# Patient Record
Sex: Male | Born: 1945 | Race: White | Hispanic: No | Marital: Married | Smoking: Never smoker
Health system: Southern US, Community
[De-identification: ages and names within clinical notes are randomized; demographics above are authoritative.]

## PROBLEM LIST (undated history)

## (undated) DIAGNOSIS — Z8679 Personal history of other diseases of the circulatory system: Secondary | ICD-10-CM

## (undated) DIAGNOSIS — G4730 Sleep apnea, unspecified: Secondary | ICD-10-CM

## (undated) DIAGNOSIS — N183 Chronic kidney disease, stage 3 unspecified: Secondary | ICD-10-CM

## (undated) DIAGNOSIS — I4891 Unspecified atrial fibrillation: Secondary | ICD-10-CM

## (undated) DIAGNOSIS — F419 Anxiety disorder, unspecified: Secondary | ICD-10-CM

## (undated) DIAGNOSIS — I10 Essential (primary) hypertension: Secondary | ICD-10-CM

## (undated) DIAGNOSIS — Z9889 Other specified postprocedural states: Secondary | ICD-10-CM

## (undated) DIAGNOSIS — G454 Transient global amnesia: Secondary | ICD-10-CM

## (undated) DIAGNOSIS — Z951 Presence of aortocoronary bypass graft: Secondary | ICD-10-CM

## (undated) DIAGNOSIS — N200 Calculus of kidney: Secondary | ICD-10-CM

## (undated) HISTORY — PX: CYSTOSCOPY: SUR368

## (undated) HISTORY — PX: TONSILLECTOMY: SUR1361

---

## 2006-08-16 ENCOUNTER — Ambulatory Visit: Payer: Self-pay | Admitting: Cardiovascular Disease

## 2007-02-24 ENCOUNTER — Ambulatory Visit: Payer: Self-pay | Admitting: Cardiovascular Disease

## 2011-05-11 NOTE — Letter (Signed)
August 16, 2006     Doreen Beam, M.D.  [address]  [place], [place]  [address]   RE:  XAVIAR, LUNN  MRN:  [id number]  /  DOB:  10/25/1947   Dear Dr. Sherril Croon:   It was my pleasure to evaluate Matheu Ploeger in the office today regarding his  mitral valve prolapse with mitral regurgitation.   As you know he is a very pleasant 65 year old man who has a long standing  history of mitral valve disease.  He reports initially being diagnosed as a  child with a heart murmur and he reports that he has been taking antibiotic  prophylaxis for dental procedures for many years.  He has had  echocardiograms in the past that have revealed mitral valve prolapse with  mild regurgitation by his report.  More recently he underwent a  transthoracic echocardiogram dated July 22, 2006 and he was found to have  moderately severe mitral regurgitation secondary to bileaflet with prolapse.  The study was technically limited and the mitral valve was not well  visualized. An exocentric mitral regurg jet was noted.  There was also  severe pulmonary hypertension suggestive of hemodynamically significant MR.  His LV ejection fraction was estimated at >60%.   From a symptomatic standpoint Mr. Breaker is doing very well.  He is an active  gentleman as he exercises on a treadmill at a speed of 4 miles per hour for  3-4 miles regularly.  He also lifts light weights on a regular basis.  He is  completely asymptomatic with these activities.  He specifically denies chest  pain, dyspnea, orthopnea, PND, light headedness, palpitations, syncope or  claudication symptoms.   PAST MEDICAL HISTORY:  Is pertinent for the following:  1. Obstructive sleep apnea.  He has not required treatment with CPAP.  2. Mitral valve prolapse with mitral regurgitation as described above.  3. Essential hypertension with a significant component of white coat      hypertension as well.  4. He has had no surgeries and no hospitalizations in the past.   FAMILY HISTORY:  His mother died at age 70 of cancer.  His father died at  age 6 of heart disease.  He has one living sister who is healthy.   SOCIAL HISTORY:  He is married and lives in [place].  He works in [employer].   He does not smoke cigarettes.  He drinks 2 alcoholic beverages daily.  He  does not drink any caffeine.  As above he has been very active in the past,  although he has not exercised as much lately due to his busy schedule.   MEDICATIONS:  1. Toprol XL 100 mg daily.   ALLERGIES:  SHELL FISH and DEMEROL.   REVIEW OF SYSTEMS:  A complete 12-point review of systems was performed and  all symptoms were negative.   PHYSICAL EXAMINATION:  GENERAL:  The patient is alert and oriented.  He is a  healthy appearing white male in no acute distress.  He is well-developed and  well-nourished.  VITAL SIGNS:  His weight is 180 pounds.  Height is 5 feet 10 inches.  Initial blood pressure was 198/122.  Blood pressure on my recheck was  180/110 in both arms.  Heart rate is 62.  Respiratory rate is 12.  EYES:  Sclerae anicteric, conjunctiva pink.  Pupils are equal, round and  reactive to light.  ENT:  The oropharynx is clear.  Moist oral mucosa.  No  cervical  lymphadenopathy.  LUNGS:  Clear to auscultation bilaterally.  CARDIOVASCULAR:  The apex is discrete and non-displaced.  The heart is  regular rate and rhythm.  There is a grade 3/6 mid to late systolic murmur  heard throughout the precordium but loudest at the apex.  Murmur actually  radiates to the neck.  There are no gallops present.  With going from the  squatting to standing position the patient's heart rate does not  significantly change and the murmur does not accentuate or become  pansystolic.  There are no midsystolic clicks present.  ABDOMEN:  Soft, non-tender.  No organomegaly.  No abdominal bruits. Bowel  sounds are present.  EXTREMITIES:  Peripheral pulses are 2+ and equal throughout.  There  are no  femoral bruits.  There are bruits over the carotid arteries but the sounds  are much louder towards the heart and it sounds more like radiation of this  heart murmur.  SKIN:  Warm and dry without rash.  NEUROLOGIC:  Strength is 5/5 and equal in the arms and legs bilaterally.  LYMPHATICS:  There is no adenopathy.   EKG:  Demonstrates normal sinus rhythm and is within normal limits.   ASSESSMENT:  This is a 65 year old man with asymptomatic valvular disease.  He presents with the following problems:  1. Mitral valve prolapse with moderate to severe mitral regurgitation.      His transthoracic echo is suggestive of moderate to severe mitral      reflux and his exam is suggestive of at least moderate mitral      regurgitation.  His left ventricular function appears well preserved      and by exam his left ventricular does not feel enlarged.  With the      technical limitations of the surface echocardiogram I would like for      Mr. Popov to undergo a transesophageal echocardiogram to better      delineate his valve morphology as well as the severity of mitral reflux      and cardiac chamber size.  We had a long discussion today about      potential treatment options.  I advised him that if he has normal left      ventricular size and preserved left ventricular fraction then we would      monitor him over time and simply continue with antibiotic prophylaxis.      I did advise him to avoid any heavy lifting because of the risk of a      torn leaflet.  However, I encouraged him to continue with his regular      cardiovascular exercises and light weight lifting.   I will followup with him by telephone after his transesophageal  echocardiography is completed and I would like to see him at least every six  months for a cardiovascular exam.  I will continue with yearly  echocardiography to follow his left ventricular size and function. 1. Hypertension.  While he clearly has a component of  white coat      hypertension he reports reasonable blood pressure control at home with      blood pressures ranging in the 130s/80s.  However, with his blood      pressure in the office in the range of 180 to 200 over 110 to 120,  I      really would like him to increase his antihypertensive medication.  I      suspect that during the course of  a regular day his blood pressure is      running on the high side.  I have taken the liberty of adding Atacand 4      mg daily to his regimen and this can be titrated upward as an      outpatient.  He should remain on 100 mg of Toprol XL.   Dr. Sherril Croon, thank you again for allowing me the opportunity to evaluate Mr.  Gowdy.  Please feel free to give me a call at anytime with questions  regarding his care.    Sincerely,      Micheline Chapman, MD   MDC/MedQ  DD:  08/16/2006  DT:  08/16/2006  Job #:  [id number]

## 2011-05-11 NOTE — Assessment & Plan Note (Signed)
[facility]                            CARDIOLOGY OFFICE NOTE   NAME:Henry Page                          MRN:          [id number]  DATE:02/24/2007                            DOB:          10/25/1947    HISTORY OF PRESENT ILLNESS:  Henry Page returns as an outpatient to the  [facility] on February 24, 2007 to follow up his mitral valve  disease.  Mr. Henry Page has mitral valve prolapse with moderately severe  mitral regurgitation.  From a symptomatic standpoint, he is doing very  well.  He specifically denies chest pain, dyspnea, lightheadedness,  orthopnea, PND, edema or syncope.  He was initially seen here on August 16, 2006 and at that time I recommended that he undergo a  transesophageal echocardiogram as his surface echo was technically  limited.  Due to a busy work schedule, he canceled his TEE and has not  had that test performed.  He has been following antibiotic prophylaxis  appropriately.  He has avoided heavy lifting.  He has remained active  only on an intermittent basis but has only exercised about once a week.   Mr. Henry Page has a history of white coat hypertension and he reports very  good home blood pressure control with rare systolic blood pressures  greater than 140 but the majority are in the 120s and 130s.   CURRENT MEDICATIONS:  1. Include only Atacand 4 mg daily.  2. Toprol 100 mg daily.   PHYSICAL EXAMINATION:  The patient is alert and oriented.  He is in no  acute distress.  Weight is 185 pounds.  Blood pressure is 178/110.  Heart rate is 63.  Respiratory rate is 12.  HEENT:  Normal.  NECK:  Normal carotid upstrokes without bruit.  Jugular venous pressure  is normal.  LUNGS:  Clear to auscultation bilaterally.  HEART:  The apex is dynamic but discreet.  It is nondisplaced.  The  heart is regular rate and rhythm with a 3/6 holosystolic murmur.  There  are no gallops or diastolic murmurs present.  ABDOMEN:  Soft and  nontender.  No organomegaly.  EXTREMITIES:  No clubbing, cyanosis, or edema.  Peripheral pulses are 2+  and equal throughout.   EKG demonstrates normal sinus rhythm and is within normal limits.   ASSESSMENT:  Mr. Henry Page is currently stable from a cardiovascular  standpoint with regard to his mitral valve prolapse with moderately  severe mitral regurgitation.  I have recommended that he go ahead and  get his transesophageal echocardiogram study done here in the next 1-2  months.  If he is proven to have normal cardiac chamber size, which I  suspect he will, then I would favor a course of watchful waiting  regarding his mitral valve disease.  He should continue to follow  antibiotic prophylaxis per ACC guidelines.  I will plan on seeing him  back in 6 months for regular follow-up with a physical exam at that  point.  He will continue to undergo yearly echocardiography.   Regarding his hypertension, I have reviewed this in  detail with him  today.  I think it is unusual to have blood pressures as high as he does  in the office and yet have good blood pressure control at home.  He is  adamant that his blood pressure control at home is good and he has a  relatively new blood pressure cuff that he has tested against other  cuffs and is accurate.  I think he should continue on his current  medicines and I have asked him to bring in a copy of the readings from  his regular blood pressure checks.     Henry Page. Henry Seltzer, MD  Electronically Signed    MDC/MedQ  DD: 02/24/2007  DT: 02/25/2007  Job #: [id number]   cc:   Henry Page

## 2013-07-04 DIAGNOSIS — R0602 Shortness of breath: Secondary | ICD-10-CM

## 2013-07-05 DIAGNOSIS — I4891 Unspecified atrial fibrillation: Secondary | ICD-10-CM

## 2013-07-05 DIAGNOSIS — R0602 Shortness of breath: Secondary | ICD-10-CM

## 2013-07-06 ENCOUNTER — Inpatient Hospital Stay (HOSPITAL_COMMUNITY)
Admission: AD | Admit: 2013-07-06 | Discharge: 2013-07-09 | DRG: 543 | Disposition: A | Discharge status: Home / Self Care | Payer: BC Managed Care – PPO | Source: Other Acute Inpatient Hospital | Attending: Internal Medicine | Admitting: Internal Medicine

## 2013-07-06 ENCOUNTER — Encounter (HOSPITAL_COMMUNITY): Payer: Self-pay | Admitting: Thoracic Surgery (Cardiothoracic Vascular Surgery)

## 2013-07-06 DIAGNOSIS — I2584 Coronary atherosclerosis due to calcified coronary lesion: Secondary | ICD-10-CM | POA: Diagnosis present

## 2013-07-06 DIAGNOSIS — I059 Rheumatic mitral valve disease, unspecified: Principal | ICD-10-CM | POA: Diagnosis present

## 2013-07-06 DIAGNOSIS — I340 Nonrheumatic mitral (valve) insufficiency: Secondary | ICD-10-CM | POA: Diagnosis present

## 2013-07-06 DIAGNOSIS — I509 Heart failure, unspecified: Secondary | ICD-10-CM | POA: Diagnosis present

## 2013-07-06 DIAGNOSIS — R0602 Shortness of breath: Secondary | ICD-10-CM

## 2013-07-06 DIAGNOSIS — R002 Palpitations: Secondary | ICD-10-CM

## 2013-07-06 DIAGNOSIS — I480 Paroxysmal atrial fibrillation: Secondary | ICD-10-CM | POA: Diagnosis present

## 2013-07-06 DIAGNOSIS — I5041 Acute combined systolic (congestive) and diastolic (congestive) heart failure: Secondary | ICD-10-CM | POA: Diagnosis present

## 2013-07-06 DIAGNOSIS — I341 Nonrheumatic mitral (valve) prolapse: Secondary | ICD-10-CM | POA: Diagnosis present

## 2013-07-06 DIAGNOSIS — F411 Generalized anxiety disorder: Secondary | ICD-10-CM | POA: Diagnosis not present

## 2013-07-06 DIAGNOSIS — I10 Essential (primary) hypertension: Secondary | ICD-10-CM

## 2013-07-06 DIAGNOSIS — I4891 Unspecified atrial fibrillation: Secondary | ICD-10-CM

## 2013-07-06 DIAGNOSIS — I2510 Atherosclerotic heart disease of native coronary artery without angina pectoris: Secondary | ICD-10-CM | POA: Diagnosis present

## 2013-07-06 HISTORY — DX: Essential (primary) hypertension: I10

## 2013-07-06 HISTORY — DX: Sleep apnea, unspecified: G47.30

## 2013-07-06 HISTORY — DX: Unspecified atrial fibrillation: I48.91

## 2013-07-06 HISTORY — DX: Calculus of kidney: N20.0

## 2013-07-06 MED ORDER — ASPIRIN 81 MG PO CHEW: 324.0000 mg | CHEWABLE_TABLET | ORAL | Status: DC

## 2013-07-06 MED ORDER — SODIUM CHLORIDE 0.9 % IJ SOLN: 3.0000 mL | INTRAMUSCULAR | Status: DC | PRN

## 2013-07-06 MED ORDER — OFF THE BEAT BOOK
Freq: Once | Status: AC
  Administered 2013-07-06: 1
  Filled 2013-07-06: qty 1

## 2013-07-06 MED ORDER — ENOXAPARIN SODIUM 80 MG/0.8ML ~~LOC~~ SOLN: 1.0000 mg/kg | Freq: Once | SUBCUTANEOUS | Status: DC

## 2013-07-06 MED ORDER — NITROGLYCERIN 0.4 MG SL SUBL: 0.4000 mg | SUBLINGUAL_TABLET | SUBLINGUAL | Status: DC | PRN

## 2013-07-06 MED ORDER — SODIUM CHLORIDE 0.9 % IV SOLN: 250.0000 mL | INTRAVENOUS | Status: DC | PRN

## 2013-07-06 MED ORDER — ASPIRIN 81 MG PO CHEW
324.0000 mg | CHEWABLE_TABLET | ORAL | Status: AC
  Administered 2013-07-06: 324 mg via ORAL
  Filled 2013-07-06: qty 4

## 2013-07-06 MED ORDER — SODIUM CHLORIDE 0.9 % IJ SOLN
3.0000 mL | Freq: Two times a day (BID) | INTRAMUSCULAR | Status: DC
  Administered 2013-07-06 – 2013-07-07 (×2): 3 mL via INTRAVENOUS

## 2013-07-06 MED ORDER — ASPIRIN EC 81 MG PO TBEC
81.0000 mg | DELAYED_RELEASE_TABLET | Freq: Every day | ORAL | Status: DC
  Administered 2013-07-08 – 2013-07-09 (×2): 81 mg via ORAL
  Filled 2013-07-06 (×3): qty 1

## 2013-07-06 MED ORDER — SODIUM CHLORIDE 0.9 % IJ SOLN: 3.0000 mL | Freq: Two times a day (BID) | INTRAMUSCULAR | Status: DC

## 2013-07-06 MED ORDER — ONDANSETRON HCL 4 MG/2ML IJ SOLN: 4.0000 mg | Freq: Four times a day (QID) | INTRAMUSCULAR | Status: DC | PRN

## 2013-07-06 MED ORDER — ASPIRIN 300 MG RE SUPP
300.0000 mg | RECTAL | Status: AC
  Filled 2013-07-06: qty 1

## 2013-07-06 MED ORDER — SODIUM CHLORIDE 0.9 % IV SOLN
1.0000 mL/kg/h | INTRAVENOUS | Status: DC
  Administered 2013-07-07: 1 mL/kg/h via INTRAVENOUS

## 2013-07-06 MED ORDER — ASPIRIN 81 MG PO CHEW
324.0000 mg | CHEWABLE_TABLET | ORAL | Status: AC
  Administered 2013-07-07: 324 mg via ORAL
  Filled 2013-07-06: qty 4

## 2013-07-06 MED ORDER — ENOXAPARIN SODIUM 40 MG/0.4ML ~~LOC~~ SOLN
40.0000 mg | SUBCUTANEOUS | Status: DC
  Administered 2013-07-06: 40 mg via SUBCUTANEOUS
  Filled 2013-07-06: qty 0.4

## 2013-07-06 MED ORDER — ACETAMINOPHEN 325 MG PO TABS: 650.0000 mg | ORAL_TABLET | ORAL | Status: DC | PRN

## 2013-07-06 MED ORDER — METOPROLOL TARTRATE 25 MG PO TABS
25.0000 mg | ORAL_TABLET | Freq: Two times a day (BID) | ORAL | Status: DC
  Administered 2013-07-06 – 2013-07-09 (×6): 25 mg via ORAL
  Filled 2013-07-06 (×8): qty 1

## 2013-07-06 MED ORDER — SODIUM CHLORIDE 0.9 % IV SOLN: 1.0000 mL/kg/h | INTRAVENOUS | Status: DC

## 2013-07-06 NOTE — H&P (Addendum)
Patient ID: Henry Page MRN: [id number], DOB/AGE: 1946/07/23   Admit date: 07/06/2013 Date of Consult: @TODAY @  Primary Physician: Ignatius Specking., MD Primary Cardiologist: Dayle Points  Last seen 2008.    Problem List: No past medical history on file.  No past surgical history on file.   Allergies:  Allergies  Allergen Reactions  . Meperidine And Related Itching  . Sulfa Antibiotics     "very sick"    HPI: patinet is a 66yo who was transferred from [facility] for evaluation of mitral regurgitation. The patient has a history of MV prolapse and moderate MR.  Also a history of HTN  Last seen by Dayle Points in 2008  Patient presented to [facility] on 7/12 with worseing palpitations and SOB.  Breathng worsened signif over 24 hours before admit.  Found to be in SVT in ER  Rx with diltiazem.  BNP elev at 489  CT of chest showed pulm edema, coronary atheroscleroisis in LAD and LCx and LM.   Patient deneis f/c,  Has had sinusitis  Rx with oxacillin Deneis CP  Denies syncope In ICU patient transitioned from dilitazem to metoprolol  Lovenox started.He was diuresed with IV lasix.  TTE was done that showed LVE at 55 mm.  LVEF 55%  RV normal.  Severe prolapse/flail of posterior MV leafelt  Severe MR  That is eccentric.  Cannot completely exclued veg vs redundancy of valve.  Cephalosporin started empirically.   Inpatient Medications:   No family history on file.   History   Social History  . Marital Status: Married    Spouse Name: N/A    Number of Children: N/A  . Years of Education: N/A   Occupational History  . Not on file.   Social History Main Topics  . Smoking status: Not on file  . Smokeless tobacco: Not on file  . Alcohol Use: Not on file  . Drug Use: Not on file  . Sexually Active: Not on file   Other Topics Concern  . Not on file   Social History Narrative  . No narrative on file     Review of Systems: General: negative for chills, fever, night sweats or  weight changes.  Cardiovascular: negative for chest pain, dyspnea on exertion, edema, orthopnea, palpitations, paroxysmal nocturnal dyspnea or shortness of breath Dermatological: negative for rash Respiratory: negative for cough or wheezing Urologic: negative for hematuria Abdominal: negative for nausea, vomiting, diarrhea, bright red blood per rectum, melena, or hematemesis Neurologic: negative for visual changes, syncope, or dizziness All other systems reviewed and are otherwise negative except as noted above.  Physical Exam: Filed Vitals:   07/06/13 1324  BP: 130/96  Pulse: 97  Temp: 98.1 F (36.7 C)  Resp: 20   No intake or output data in the 24 hours ending 07/06/13 1422  General: Well developed, well nourished, in no acute distress. Head: Normocephalic, atraumatic, sclera non-icteric Neck: Negative for carotid bruits. JVP not elevated. Lungs: Clear bilaterally to auscultation without wheezes, rales, or rhonchi. Breathing is unlabored. Heart: RRR with S1 S2. No murmurs, rubs, or gallops appreciated. Abdomen: Soft, non-tender, non-distended with normoactive bowel sounds. No hepatomegaly. No rebound/guarding. No obvious abdominal masses. Msk:  Strength and tone appears normal for age. Extremities: No clubbing, cyanosis or edema.  Distal pedal pulses are 2+ and equal bilaterally. Neuro: Alert and oriented X 3. Moves all extremities spontaneously. Psych:  Responds to questions appropriately with a normal affect.  Labs: No results found for this or any  previous visit (from the past 24 hour(s)).  Radiology/Studies: No results found.  EKG:  7/13 at 8:52 AM  Atrial fibrillation.  T wave inversion V1/V2.   7/12  Afib 148 bpm.    ASSESSMENT AND PLAN:  Patient is a 67 yo who presented with severe SOB, palpitations  Echo shows severe MR QUestion flail leaflet.  No signs on exam to suggest endocarditis  He did have fever last week and is on ABX.  I would recomm TEE and Cath to  evaluate valve and coronary anatomy.  TEE may not be able to be done tomorrow as schedule full  Order  May happen on Wed. I have discussed with C Owen.  Patient understands he will need surgery If he develops fever would do blood cultures  2.  Atial fibrillaton  Continue with rate control, Lovenox  3.  CHF  Secondary to 1.  Follow and Rx with lasix.     Signed, Dietrich Pates 07/06/2013, 2:22 PM

## 2013-07-06 NOTE — Progress Notes (Signed)
TCTS BRIEF PROGRESS NOTE   Patient seen and examined.  Await TEE and cath.  Full consult to follow.  Henry Page H 07/06/2013 4:47 PM

## 2013-07-06 NOTE — Consult Note (Addendum)
[address]       Henry Page [address]             [phone]          CARDIOTHORACIC SURGERY CONSULTATION REPORT  PCP is Page,Henry B., MD Referring Provider is ROSS, Sherol Dade, MD   Reason for consultation:  Severe mitral regurgitation  HPI:  Patient is a 66 year old married white male from [facility] with known history of mitral valve prolapse and mitral regurgitation who was transferred from [facility] for evaluation of severe mitral regurgitation with rapid atrial fibrillation and acute combined systolic and diastolic congestive heart failure.  The patient reports that he has known for many years that he had a heart murmur. He has been told in the past that he had mitral valve prolapse and mitral regurgitation. He has been treated intermittently for hypertension and in the past had been seen by Dr. Excell Page, most recently in 2008. The patient has otherwise remained remarkably healthy all of his life and physically active. He walks a mile nearly every day and reports no significant physical limitations. He does note a long history of mild exertional shortness of breath. However, this had been quite stable until last week when he somewhat acutely developed severe exertional shortness of breath and intermittent episodes of resting shortness of breath. He has a long history of palpitations without dizzy spells or syncope. He has never had any chest pain or chest tightness.    The patient's symptoms of shortness of breath and palpitations progressed over the course of last week until he presented 2 days ago at [facility] where he was found to be in supraventricular tachycardia. BNP level was 489.  CT scan of the chest revealed pulmonary edema with coronary atherosclerosis in the left anterior descending coronary artery, left circumflex coronary artery. In the ED he was treated with intravenous diltiazem, and his shortness of breath improved with medical  therapy including beta blocker for rate control and intravenous Lasix. A transthoracic echocardiogram was performed that reportedly demonstrated mitral valve prolapse with an obvious flail segment of the posterior leaflet of the mitral valve and severe mitral regurgitation. Left ventricular ejection fraction was estimated 55%. The patient was reportedly started on a cephalosporin empirically because of the possibility of bacterial endocarditis, and he was transferred to [facility] for further management. Cardiothoracic surgical consultation has been requested. Transesophageal echocardiogram has been scheduled for tomorrow.  Past Medical History  Diagnosis Date  . Hypertension   . Kidney stones   . Mitral valve prolapse 07/06/2013  . Mitral regurgitation 07/06/2013  . Atrial fibrillation 07/06/2013  . Essential hypertension, benign 07/06/2013  . Acute combined systolic and diastolic congestive heart failure 07/06/2013    No past surgical history on file.  No family history on file.  History   Social History  . Marital Status: Married    Spouse Name: N/A    Number of Children: N/A  . Years of Education: N/A   Occupational History  . sales    Social History Main Topics  . Smoking status: Never Smoker   . Smokeless tobacco: Not on file  . Alcohol Use: No  . Drug Use: Not on file  . Sexually Active: Not on file   Other Topics Concern  . Not on file   Social History Narrative  . No narrative on file    Prior to Admission medications   Not on File  Current Facility-Administered Medications  Medication Dose Route Frequency Provider Last Rate Last Dose  . 0.9 %  sodium chloride infusion  250 mL Intravenous PRN Pricilla Riffle, MD      . acetaminophen (TYLENOL) tablet 650 mg  650 mg Oral Q4H PRN Pricilla Riffle, MD      . aspirin chewable tablet 324 mg  324 mg Oral NOW Pricilla Riffle, MD       Or  . aspirin suppository 300 mg  300 mg Rectal NOW Pricilla Riffle, MD        . Melene Muller ON 07/07/2013] aspirin EC tablet 81 mg  81 mg Oral Daily Pricilla Riffle, MD      . enoxaparin (LOVENOX) injection 40 mg  40 mg Subcutaneous Q24H Pricilla Riffle, MD      . nitroGLYCERIN (NITROSTAT) SL tablet 0.4 mg  0.4 mg Sublingual Q5 Min x 3 PRN Pricilla Riffle, MD      . off the beat book   Does not apply Once Pricilla Riffle, MD      . ondansetron [facility]) injection 4 mg  4 mg Intravenous Q6H PRN Pricilla Riffle, MD      . sodium chloride 0.9 % injection 3 mL  3 mL Intravenous Q12H Pricilla Riffle, MD      . sodium chloride 0.9 % injection 3 mL  3 mL Intravenous PRN Pricilla Riffle, MD        Allergies  Allergen Reactions  . Meperidine And Related Itching  . Sulfa Antibiotics     "very sick"      Review of Systems:   General:  normal appetite, normal energy, no weight gain, no weight loss, no fever  Cardiac:  no chest pain with exertion, no chest pain at rest, + SOB with exertion, + intermittent resting SOB, no PND, no orthopnea, + palpitations, + arrhythmia, + atrial fibrillation, no LE edema, no dizzy spells, no syncope  Respiratory:  + shortness of breath, no home oxygen, no productive cough, no dry cough, no bronchitis, no wheezing, no hemoptysis, no asthma, no pain with inspiration or cough, no sleep apnea, no CPAP at night  GI:   no difficulty swallowing, no reflux, no frequent heartburn, no hiatal hernia, no abdominal pain, no constipation, no diarrhea, no hematochezia, no hematemesis, no melena  GU:   no dysuria,  no frequency, no urinary tract infection, no hematuria, no enlarged prostate, + kidney stones, no kidney disease  Vascular:  no pain suggestive of claudication, no pain in feet, no leg cramps, no varicose veins, no DVT, no non-healing foot ulcer  Neuro:   no stroke, no TIA's, no seizures, no headaches, no temporary blindness one eye,  no slurred speech, no peripheral neuropathy, no chronic pain, no instability of gait, no memory/cognitive dysfunction  Musculoskeletal: no  arthritis, no joint swelling, no myalgias, no difficulty walking, normal mobility   Skin:   no rash, no itching, no skin infections, no pressure sores or ulcerations  Psych:   no anxiety, no depression, no nervousness, + unusual recent stress  Eyes:   no blurry vision, no floaters, no recent vision changes, + wears glasses or contacts  ENT:   no hearing loss, no loose or painful teeth, no dentures, last saw dentist within the past year - always gets antibiotic prophylaxis  Hematologic:  no easy bruising, no abnormal bleeding, no clotting disorder, no frequent epistaxis  Endocrine:  no diabetes, does not check CBG's  at home     Physical Exam:   BP 130/96  Pulse 97  Temp(Src) 98.1 F (36.7 C) (Oral)  Resp 20  Ht 5\' 10"  (1.778 m)  Wt 76.8 kg (169 lb 5 oz)  BMI 24.29 kg/m2  SpO2 94%  General:    well-appearing  HEENT:  Unremarkable   Neck:   no JVD, no bruits, no adenopathy   Chest:   clear to auscultation, symmetrical breath sounds, no wheezes, no rhonchi   CV:   RRR, loud grade IV/VI holosystolic murmur   Abdomen:  soft, non-tender, no masses   Extremities:  warm, well-perfused, pulses palpable  Rectal/GU  Deferred  Neuro:   Grossly non-focal and symmetrical throughout  Skin:   Clean and dry, no rashes, no breakdown  Diagnostic Tests:  By report the transthoracic echocardiogram performed at [facility] revealed mitral valve prolapse with flail segment of the posterior leaflet and severe mitral regurgitation. The jet of regurgitation was eccentric. Left ventricular ejection fraction was estimated 55%. By report the RV size and function appeared normal. Images from this exam are not currently available for review.   Impression:  Severe symptomatic mitral regurgitation with mitral valve prolapse and flail segment posterior leaflet of the mitral valve. The patient presents with likely recent onset persistent atrial fibrillation with acute on chronic combined systolic and  diastolic congestive heart failure. Symptoms have improved rapidly with medical therapy.  The patient has no symptoms nor signs of bacterial endocarditis. Transesophageal echocardiogram is pending.  Plan:  We await transesophageal echocardiogram. Ultimately the patient will need left and right heart catheterization. I would favor that catheterization be performed via radial or left femoral approach, as the patient may be candidate for minimally invasive approach for surgery in the absence of significant coronary artery disease. I would favor loading the patient was amiodarone. Will continue to follow closely.    Salvatore Decent. Cornelius Moras, MD 07/06/2013 5:40 PM  I spent in excess of 60 minutes of time directly involved in the conduct of this consultation.

## 2013-07-06 NOTE — Progress Notes (Signed)
Unable to add to record Flu shots had in 2014 Pneumonia shot 2013

## 2013-07-06 NOTE — Progress Notes (Addendum)
Rec pt from [facility]p. Pt o4x no complaints of pain or sob. HF booklet and afib booklet giving. Pt states concern over limited fluid intake giving his Hx of kidney stones.  Pt also states he thinks he may have mild sleep apnea, could use outPT sleep study

## 2013-07-07 ENCOUNTER — Encounter (HOSPITAL_COMMUNITY): Payer: Self-pay | Admitting: Physician Assistant

## 2013-07-07 ENCOUNTER — Encounter (HOSPITAL_COMMUNITY)
Admission: AD | Disposition: A | Discharge status: Home / Self Care | Payer: Self-pay | Source: Other Acute Inpatient Hospital | Attending: Internal Medicine

## 2013-07-07 DIAGNOSIS — I4891 Unspecified atrial fibrillation: Secondary | ICD-10-CM

## 2013-07-07 DIAGNOSIS — I2510 Atherosclerotic heart disease of native coronary artery without angina pectoris: Secondary | ICD-10-CM

## 2013-07-07 HISTORY — PX: LEFT AND RIGHT HEART CATHETERIZATION WITH CORONARY ANGIOGRAM: SHX5449

## 2013-07-07 LAB — POCT I-STAT 3, ART BLOOD GAS (G3+)
Bicarbonate: 25.9 mEq/L — ABNORMAL HIGH (ref 20.0–24.0)
O2 Saturation: 97 %
TCO2: 27 mmol/L (ref 0–100)
pO2, Arterial: 93 mmHg (ref 80.0–100.0)

## 2013-07-07 LAB — CBC
HCT: 40.1 % (ref 39.0–52.0)
Hemoglobin: 13.2 g/dL (ref 13.0–17.0)
MCH: 31.4 pg (ref 26.0–34.0)
MCHC: 32.9 g/dL (ref 30.0–36.0)
RBC: 4.21 MIL/uL — ABNORMAL LOW (ref 4.22–5.81)

## 2013-07-07 LAB — BASIC METABOLIC PANEL
BUN: 27 mg/dL — ABNORMAL HIGH (ref 6–23)
CO2: 27 mEq/L (ref 19–32)
GFR calc non Af Amer: 50 mL/min — ABNORMAL LOW (ref >90)
Glucose, Bld: 99 mg/dL (ref 70–99)
Potassium: 4.1 mEq/L (ref 3.5–5.1)

## 2013-07-07 LAB — POCT I-STAT 3, VENOUS BLOOD GAS (G3P V)
Bicarbonate: 25.1 mEq/L — ABNORMAL HIGH (ref 20.0–24.0)
O2 Saturation: 61 %
TCO2: 27 mmol/L (ref 0–100)
pH, Ven: 7.329 — ABNORMAL HIGH (ref 7.250–7.300)

## 2013-07-07 LAB — PROTIME-INR: INR: 1.04 (ref 0.00–1.49)

## 2013-07-07 SURGERY — LEFT AND RIGHT HEART CATHETERIZATION WITH CORONARY ANGIOGRAM

## 2013-07-07 MED ORDER — ONDANSETRON HCL 4 MG/2ML IJ SOLN: 4.0000 mg | Freq: Four times a day (QID) | INTRAMUSCULAR | Status: DC | PRN

## 2013-07-07 MED ORDER — NITROGLYCERIN 0.2 MG/ML ON CALL CATH LAB
INTRAVENOUS | Status: AC
  Filled 2013-07-07: qty 1

## 2013-07-07 MED ORDER — ACETAMINOPHEN 325 MG PO TABS: 650.0000 mg | ORAL_TABLET | ORAL | Status: DC | PRN

## 2013-07-07 MED ORDER — SODIUM CHLORIDE 0.9 % IV SOLN
INTRAVENOUS | Status: DC
  Administered 2013-07-08: 01:00:00 via INTRAVENOUS

## 2013-07-07 MED ORDER — FENTANYL CITRATE 0.05 MG/ML IJ SOLN
INTRAMUSCULAR | Status: AC
  Filled 2013-07-07: qty 2

## 2013-07-07 MED ORDER — LIDOCAINE HCL (PF) 1 % IJ SOLN
INTRAMUSCULAR | Status: AC
  Filled 2013-07-07: qty 30

## 2013-07-07 MED ORDER — ATORVASTATIN CALCIUM 80 MG PO TABS
80.0000 mg | ORAL_TABLET | Freq: Every day | ORAL | Status: DC
  Administered 2013-07-07 – 2013-07-09 (×3): 80 mg via ORAL
  Filled 2013-07-07 (×3): qty 1

## 2013-07-07 MED ORDER — SODIUM CHLORIDE 0.9 % IV SOLN: INTRAVENOUS | Status: DC

## 2013-07-07 MED ORDER — ENOXAPARIN SODIUM 80 MG/0.8ML ~~LOC~~ SOLN
80.0000 mg | Freq: Two times a day (BID) | SUBCUTANEOUS | Status: DC
  Administered 2013-07-07 – 2013-07-08 (×3): 80 mg via SUBCUTANEOUS
  Filled 2013-07-07 (×6): qty 0.8

## 2013-07-07 MED ORDER — MIDAZOLAM HCL 2 MG/2ML IJ SOLN
INTRAMUSCULAR | Status: AC
  Filled 2013-07-07: qty 2

## 2013-07-07 MED ORDER — HEPARIN (PORCINE) IN NACL 2-0.9 UNIT/ML-% IJ SOLN
INTRAMUSCULAR | Status: AC
  Filled 2013-07-07: qty 1500

## 2013-07-07 MED ORDER — LEVOFLOXACIN IN D5W 750 MG/150ML IV SOLN
750.0000 mg | INTRAVENOUS | Status: DC
  Administered 2013-07-07 – 2013-07-08 (×2): 750 mg via INTRAVENOUS
  Filled 2013-07-07 (×4): qty 150

## 2013-07-07 NOTE — Interval H&P Note (Signed)
History and Physical Interval Note:  07/07/2013 10:18 AM  Henry Page  has presented today for surgery, with the diagnosis of Chest pain  The various methods of treatment have been discussed with the patient and family. After consideration of risks, benefits and other options for treatment, the patient has consented to  Procedure(s): LEFT AND RIGHT HEART CATHETERIZATION WITH CORONARY ANGIOGRAM as a surgical intervention .  The patient's history has been reviewed, patient examined, no change in status, stable for surgery.  I have reviewed the patient's chart and labs.  Questions were answered to the patient's satisfaction.     Elyn Aquas.

## 2013-07-07 NOTE — Progress Notes (Signed)
ANTICOAGULATION CONSULT NOTE - Follow Up Consult  Pharmacy Consult for Lovenox Indication: atrial fibrillation  Allergies  Allergen Reactions  . Meperidine And Related Itching  . Sulfa Antibiotics     "very sick"    Patient Measurements: Height: 5\' 10"  (177.8 cm) Weight: 169 lb 1.5 oz (76.7 kg) (scale B) IBW/kg (Calculated) : 73  Vital Signs: Temp: 97.7 F (36.5 C) (07/15 1345) Temp src: Oral (07/15 1345) BP: 122/66 mmHg (07/15 1700) Pulse Rate: 70 (07/15 1700)  Labs:  Recent Labs  07/07/13 0507  HGB 13.2  HCT 40.1  PLT 214  LABPROT 13.4  INR 1.04  CREATININE 1.42*    Estimated Creatinine Clearance: 52.1 ml/min (by C-G formula based on Cr of 1.42).  Assessment:   s/p cardiac cath. Moderate to severe MR. May need valve replacement.  For TEE on 7/16.  Lovenox 80 mg SQ q12hrs begun 7/12 at [facility]. Held on transfer to [facility] for cath today. Sheath removed ~11:15am. Right groin site noted without bleeding or hematoma. Noted plan to change to IV heparin if surgery needed.  Goal of Therapy:  Anti-Xa level 0.6-1.2 units/ml 4hrs after LMWH dose given Monitor platelets by anticoagulation protocol: Yes   Plan:   Lovenox 80 mg SQ 12 hrs.  CBC at least every 3 days while on Lovenox.  Will follow up TEE and possible plans for surgery.  Dennie Fetters, [place] Pager: [phone] 07/07/2013,6:00 PM

## 2013-07-07 NOTE — CV Procedure (Signed)
    Cardiac Cath Note  Henry Page [id number] [birth year]  Procedure: right and left  Heart Cardiac Catheterization Note Indications: MR, MVP  Procedure Details Consent: Obtained Time Out: Verified patient identification, verified procedure, site/side was marked, verified correct patient position, special equipment/implants available, Radiology Safety Procedures followed,  medications/allergies/relevent history reviewed, required imaging and test results available.  Performed   Medications: Fentanyl: 50 mcg IV Versed: 2 mg IV  The right femoral artery and right femoral vein were easily canulated using a modified Seldinger technique.  Hemodynamics:   RA: 10/11/9 RV: 43/6 PCWP: 21/25/21 PA:  43/25 (32)  Cardiac Output   Thermodilution: 3.78  Index of 1.94  Fick : 4.01    Index of 2.06  Arterial Sat: 97% PA Sat: 61%.  LV pressure: 99/17 Aortic pressure: 106/75  Angiography   Left Main: distal 40% , calcified  Left anterior Descending: proximal 40 % - 60% calcified stenosis.   Mid  60-70% stenosis.  diags are small - moderate in size and are otherwise normal.   Left Circumflex: moderate sized system.  50 % lesion in OM branch  Ramus Intermediate:  Large vessel 40-50% stenosis in proximal vessel.    Right Coronary Artery: large , dominant, mid 70 - 80% (long stenosis) .  PDA  LV Gram: normal LV function , EF 55%,  Moderate - severe MR  Complications: No apparent complications Patient did tolerate procedure well.  Contrast used: 80 cc  Conclusions:   1. CAD involving the LAD and RCA with moderate disease of the Ramus and LCx.   2. Well preserved LV function 3. Moderate - severe MR  Alvia Grove., MD, [facility] 07/07/2013, 11:06 AM Office - [phone] Pager [phone]

## 2013-07-07 NOTE — Progress Notes (Signed)
   Patient Name: Henry Page Date of Encounter: 07/07/2013     Principal Problem:   Acute combined systolic and diastolic congestive heart failure Active Problems:   Mitral valve prolapse   Mitral regurgitation   Atrial fibrillation   Essential hypertension, benign    SUBJECTIVE: Feels well today and post-cath. No chest pain, palpitations or shortness of breath.    OBJECTIVE  Filed Vitals:   07/07/13 1227 07/07/13 1245 07/07/13 1315 07/07/13 1345  BP: 101/70 119/84 122/74 121/70  Pulse: 70 81 78 71  Temp:    97.7 F (36.5 C)  TempSrc:    Oral  Resp: 20 20 20 18   Height:      Weight:      SpO2: 96% 96% 96% 96%    Intake/Output Summary (Last 24 hours) at 07/07/13 1604 Last data filed at 07/07/13 1300  Gross per 24 hour  Intake    463 ml  Output    650 ml  Net   -187 ml   Weight change:   PHYSICAL EXAM  General: Well developed, well nourished, in no acute distress. Head: Normocephalic, atraumatic, sclera non-icteric, no xanthomas, nares are without discharge.  Neck: Supple without bruits or JVD. Lungs:  Resp regular and unlabored, CTA. Heart: RRR, IV/VI systolic LLSB murmur radiating to axilla, no s3, s4 Abdomen: Soft, non-tender, non-distended, BS + x 4.  Msk:  Strength and tone appears normal for age. Extremities:  No clubbing, cyanosis or edema. DP/PT/Radials 2+ and equal bilaterally. Neuro: Alert and oriented X 3. Moves all extremities spontaneously. Psych: Normal affect.  LABS:  Recent Labs     07/07/13  0507  WBC  7.2  HGB  13.2  HCT  40.1  MCV  95.2  PLT  214   Recent Labs Lab 07/07/13 0507  NA 138  K 4.1  CL 100  CO2 27  BUN 27*  CREATININE 1.42*  CALCIUM 9.2  GLUCOSE 99    TELE: atrial fibrillation, 80-90 bpm  ECG: atrial fibrillation, 81 bpm, LAD, no ST/T changes   Radiology/Studies:  No results found.  Current Medications:  . aspirin EC  81 mg Oral Daily  . levofloxacin (LEVAQUIN) IV  750 mg Intravenous Q24H  .  metoprolol tartrate  25 mg Oral BID    ASSESSMENT AND PLAN:  1. Moderate-severe MR- with resultant acute CHF. Cath today indicated moderate-severe MR. TCTS evaluated the patient yesterday. TEE pending. Schedule full on admission. Orders placed, will make sure he is scheduled for tomorrow. NPO at midnight. Started on empiric levofloxacin at [facility]. Would continue until TEE performed to r/o vegetation. No evidence of fevers or leukocytosis.   2. Moderate CAD- 40% distal LM, 40-60% prox LAD, 60-70% mid LAD, 50% OM, 40-50% prox ramus, 70-80% mid RCA; LVEF 55%, mod-sev MR. Borderline CAD lesions. Will continue ASA, BB. Add atorvastatin 80 mg daily. Continue NTG SL PRN.   3. Atrial fibrillation with RVR- rate-controlled today on Lopressor 25 PO BID. Preference from TCTS was for amiodarone load. CHADSVASc = 4. Lovenox for anticoagulation. Switch to heparin should surgical intervention be required.   4. Hypertension- BPs well-controlled today.     Signed, R. Hurman Horn, PA-C 07/07/2013, 4:04 PM TEE tomorrow. No change in meds today.

## 2013-07-07 NOTE — Progress Notes (Addendum)
ANTIBIOTIC CONSULT NOTE - INITIAL  Pharmacy Consult for Levaquin Indication: rule out pneumonia  Allergies  Allergen Reactions  . Meperidine And Related Itching  . Sulfa Antibiotics     "very sick"    Patient Measurements: Height: 5\' 10"  (177.8 cm) Weight: 169 lb 5 oz (76.8 kg) IBW/kg (Calculated) : 73  Vital Signs: Temp: 97.9 F (36.6 C) (07/14 2137) Temp src: Oral (07/14 2137) BP: 139/86 mmHg (07/14 2137) Pulse Rate: 79 (07/14 2137) Intake/Output from previous day: 07/14 0701 - 07/15 0700 In: 240 [P.O.:240] Out: 620 [Urine:620] Intake/Output from this shift: Total I/O In: -  Out: 150 [Urine:150]  Labs (at [facility]): WBC   8.3 Hgb   12.7 Hct   36.8 Plt   179  SCr   1.44 No results found for this basename: WBC, HGB, PLT, LABCREA, CREATININE,  in the last 72 hours CrCl is unknown because no creatinine reading has been taken. No results found for this basename: VANCOTROUGH, VANCOPEAK, VANCORANDOM, GENTTROUGH, GENTPEAK, GENTRANDOM, TOBRATROUGH, TOBRAPEAK, TOBRARND, AMIKACINPEAK, AMIKACINTROU, AMIKACIN,  in the last 72 hours   Microbiology: No results found for this or any previous visit (from the past 720 hour(s)).  Medical History: Past Medical History  Diagnosis Date  . Hypertension   . Kidney stones   . Mitral valve prolapse 07/06/2013  . Mitral regurgitation 07/06/2013  . Atrial fibrillation 07/06/2013  . Essential hypertension, benign 07/06/2013  . 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 Heart murmur   . Sleep apnea     Medications:  No prescriptions prior to admission   Assessment: 67 yo male with SOB, possible PNA for empiric antibiotics.  On Levaquin 750 mg IV q24h at [facility], last dose appears to have been AM 7/14  Plan:  Continue Levaquin 750 mg IV q24h  Eddie Candle 07/07/2013,12:36 AM

## 2013-07-08 ENCOUNTER — Encounter (HOSPITAL_COMMUNITY): Payer: Self-pay | Admitting: *Deleted

## 2013-07-08 ENCOUNTER — Encounter (HOSPITAL_COMMUNITY)
Admission: AD | Disposition: A | Discharge status: Home / Self Care | Payer: Self-pay | Source: Other Acute Inpatient Hospital | Attending: Internal Medicine

## 2013-07-08 DIAGNOSIS — I509 Heart failure, unspecified: Secondary | ICD-10-CM

## 2013-07-08 DIAGNOSIS — I5041 Acute combined systolic (congestive) and diastolic (congestive) heart failure: Secondary | ICD-10-CM

## 2013-07-08 DIAGNOSIS — I059 Rheumatic mitral valve disease, unspecified: Secondary | ICD-10-CM

## 2013-07-08 HISTORY — PX: TEE WITHOUT CARDIOVERSION: SHX5443

## 2013-07-08 LAB — COMPREHENSIVE METABOLIC PANEL
ALT: 30 U/L (ref 0–53)
AST: 31 U/L (ref 0–37)
Albumin: 3.1 g/dL — ABNORMAL LOW (ref 3.5–5.2)
Alkaline Phosphatase: 81 U/L (ref 39–117)
Calcium: 8.9 mg/dL (ref 8.4–10.5)
Glucose, Bld: 99 mg/dL (ref 70–99)
Potassium: 4.1 mEq/L (ref 3.5–5.1)
Sodium: 139 mEq/L (ref 135–145)
Total Protein: 6.7 g/dL (ref 6.0–8.3)

## 2013-07-08 LAB — URINALYSIS, ROUTINE W REFLEX MICROSCOPIC
Ketones, ur: NEGATIVE mg/dL
Leukocytes, UA: NEGATIVE
Nitrite: NEGATIVE
Specific Gravity, Urine: 1.02 (ref 1.005–1.030)
pH: 6 (ref 5.0–8.0)

## 2013-07-08 SURGERY — ECHOCARDIOGRAM, TRANSESOPHAGEAL
Anesthesia: Moderate Sedation

## 2013-07-08 MED ORDER — MIDAZOLAM HCL 10 MG/2ML IJ SOLN
INTRAMUSCULAR | Status: DC | PRN
  Administered 2013-07-08 (×2): 2 mg via INTRAVENOUS

## 2013-07-08 MED ORDER — FENTANYL CITRATE 0.05 MG/ML IJ SOLN
INTRAMUSCULAR | Status: DC | PRN
  Administered 2013-07-08 (×2): 25 ug via INTRAVENOUS

## 2013-07-08 MED ORDER — MIDAZOLAM HCL 5 MG/ML IJ SOLN
INTRAMUSCULAR | Status: AC
  Filled 2013-07-08: qty 2

## 2013-07-08 MED ORDER — HYDRALAZINE HCL 20 MG/ML IJ SOLN: 10.0000 mg | INTRAMUSCULAR | Status: DC | PRN

## 2013-07-08 MED ORDER — BUTAMBEN-TETRACAINE-BENZOCAINE 2-2-14 % EX AERO
INHALATION_SPRAY | CUTANEOUS | Status: DC | PRN
  Administered 2013-07-08: 2 via TOPICAL

## 2013-07-08 MED ORDER — DIPHENHYDRAMINE HCL 50 MG/ML IJ SOLN
INTRAMUSCULAR | Status: AC
  Filled 2013-07-08: qty 1

## 2013-07-08 MED ORDER — AMIODARONE HCL 200 MG PO TABS
200.0000 mg | ORAL_TABLET | Freq: Two times a day (BID) | ORAL | Status: DC
  Administered 2013-07-08: 200 mg via ORAL
  Filled 2013-07-08 (×4): qty 1

## 2013-07-08 MED ORDER — FENTANYL CITRATE 0.05 MG/ML IJ SOLN
INTRAMUSCULAR | Status: AC
  Filled 2013-07-08: qty 2

## 2013-07-08 NOTE — Progress Notes (Signed)
  Echocardiogram Echocardiogram Transesophageal has been performed.  Persephonie Hegwood 07/08/2013, 2:15 PM

## 2013-07-08 NOTE — Progress Notes (Signed)
Report has been given to night shift RN.  Nurse has no questions or concerns at this time.  Pt orders have been reviewed.  Pt resting comfortably in bed and appears in no acute distress. Nino Glow  RN

## 2013-07-08 NOTE — Progress Notes (Signed)
Patient's B/P reassessed and still found to be elevated Metoprolol (10am dose) was given. Will continue to monitor B/P.  Patient is anxious, but stated that he does not want me to call MD and get him anything for it right now.  Will continue to monitor patient for increased anxiety and/or agitation.

## 2013-07-08 NOTE — Progress Notes (Signed)
Pt BP 154/111 manual this AM. HR in 90s. MD notified and ordered to give 10AM dose of lopressor early. Waiting for pharmacy to send dose. Baron Hamper, RN 07/08/2013

## 2013-07-08 NOTE — Plan of Care (Signed)
Problem: Phase I Progression Outcomes Goal: EF % per last Echo/documented,Core Reminder form on chart Outcome: Completed/Met Date Met:  07/08/13 Heart Catheterization on 07/07/2013 EF 55%

## 2013-07-08 NOTE — Interval H&P Note (Signed)
History and Physical Interval Note:  07/08/2013 1:26 PM  Bronc Brosseau  has presented today for surgery, with the diagnosis of Mitral Regurgitation  The various methods of treatment have been discussed with the patient and family. After consideration of risks, benefits and other options for treatment, the patient has consented to  Procedure(s): TRANSESOPHAGEAL ECHOCARDIOGRAM (TEE) (N/A) as a surgical intervention .  The patient's history has been reviewed, patient examined, no change in status, stable for surgery.  I have reviewed the patient's chart and labs.  Questions were answered to the patient's satisfaction.     Arlone Lenhardt [employer]

## 2013-07-08 NOTE — Progress Notes (Addendum)
    Subjective:  Anxious this am, but better now. No CP or dyspnea.  Objective:  Vital Signs in the last 24 hours: Temp:  [97.7 F (36.5 C)-98.1 F (36.7 C)] 97.9 F (36.6 C) (07/16 0500) Pulse Rate:  [70-96] 96 (07/16 0817) Resp:  [18-20] 18 (07/16 0817) BP: (100-162)/(60-118) 162/108 mmHg (07/16 0817) SpO2:  [95 %-96 %] 96 % (07/16 0817) Weight:  [78.155 kg (172 lb 4.8 oz)] 78.155 kg (172 lb 4.8 oz) (07/16 0500)  Intake/Output from previous day: 07/15 0701 - 07/16 0700 In: 613 [P.O.:460; I.V.:3; IV Piggyback:150] Out: 1100 [Urine:1100]  Physical Exam: Pt is alert and oriented, NAD HEENT: normal Neck: JVP - normal Lungs: CTA bilaterally CV: irregular with grade 3/6 holosystolic murmur at the LV apex Abd: soft, NT, Positive BS, no hepatomegaly Ext: no C/C/E, distal pulses intact and equal Skin: warm/dry no rash   Lab Results:  Recent Labs  07/07/13 0507  WBC 7.2  HGB 13.2  PLT 214    Recent Labs  07/07/13 0507 07/08/13 0655  NA 138 139  K 4.1 4.1  CL 100 105  CO2 27 27  GLUCOSE 99 99  BUN 27* 24*  CREATININE 1.42* 1.34   No results found for this basename: TROPONINI, CK, MB,  in the last 72 hours  Cardiac Studies: Cardiac Cath: Hemodynamics:  RA: 10/11/9  RV: 43/6  PCWP: 21/25/21  PA: 43/25 (32)  Cardiac Output  Thermodilution: 3.78 Index of 1.94  Fick : 4.01 Index of 2.06  Arterial Sat: 97%  PA Sat: 61%.  LV pressure: 99/17  Aortic pressure: 106/75  Angiography  Left Main: distal 40% , calcified  Left anterior Descending: proximal 40 % - 60% calcified stenosis. Mid 60-70% stenosis. diags are small - moderate in size and are otherwise normal.  Left Circumflex: moderate sized system. 50 % lesion in OM branch  Ramus Intermediate: Large vessel 40-50% stenosis in proximal vessel.  Right Coronary Artery: large , dominant, mid 70 - 80% (long stenosis) . PDA  LV Gram: normal LV function , EF 55%, Moderate - severe MR  Complications: No apparent  complications  Patient did tolerate procedure well.  Contrast used: 80 cc  Conclusions:  1. CAD involving the LAD and RCA with moderate disease of the Ramus and LCx.  2. Well preserved LV function  3. Moderate - severe MR   Tele: Atrial fibrillation  Assessment/Plan:  1. Acute systolic heart failure, improved 2. Severe MR - for TEE today 3. 2 vessel CAD 4. HTN  Will write for prn hydralazine for BP, but suspect anxiety is driving this today. I reviewed his cath films and suspect he will 2 vessel CABG of the LAD/RCA with his mitral valve surgery. Further plans pending TEE this afternoon. Appreciate Dr Orvan July consultation. Note patient is on lovenox and this will need to be stopped 24 hours before cardiac surgery.  Tonny Bollman, M.D. 07/08/2013, 10:31 AM

## 2013-07-08 NOTE — CV Procedure (Signed)
Procedure:TEE  Indication: Mitral regurgitation  Sedation: Versed 4 mg IV, Fentanyl 50 mcg IV  Findings: Please see report in echo section of chart for full details. Normal LV size and systolic function, EF 55-60%.  Moderate left atrial enlargement.  Normal RV size and systolic function.  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  There was systolic flow reversal in the pulmonic vein doppler profile.    No complications.   Marca Ancona 07/08/2013 1:54 PM

## 2013-07-08 NOTE — Progress Notes (Signed)
[address]       [name] [address]             [phone]     CARDIOTHORACIC SURGERY PROGRESS NOTE  Day of Surgery  S/P Procedure(s) (LRB): TRANSESOPHAGEAL ECHOCARDIOGRAM (TEE) (N/A)  Subjective: Mr Henry Page continues to deny any SOB or palpitations.  His symptoms have remained well controlled since his transfer to [facility].  He did have some anxiety this morning.  Objective: Vital signs in last 24 hours: Temp:  [97.9 F (36.6 C)-98.1 F (36.7 C)] 97.9 F (36.6 C) (07/16 1359) Pulse Rate:  [77-114] 114 (07/16 1258) Cardiac Rhythm:  [-] Atrial fibrillation (07/16 0900) Resp:  [6-44] 23 (07/16 1420) BP: (99-162)/(46-130) 112/77 mmHg (07/16 1420) SpO2:  [91 %-98 %] 97 % (07/16 1420) Weight:  [78.155 kg (172 lb 4.8 oz)] 78.155 kg (172 lb 4.8 oz) (07/16 0500)  Physical Exam:  Rhythm:   Afib  Breath sounds: clear  Heart sounds:  irreg w/ holosystolic murmur  Incisions:  n/a  Abdomen:  soft  Extremities:  Warm, no edema   Intake/Output from previous day: 07/15 0701 - 07/16 0700 In: 613 [P.O.:460; I.V.:3; IV Piggyback:150] Out: 1100 [Urine:1100] Intake/Output this shift:    Lab Results:  Recent Labs  07/07/13 0507  WBC 7.2  HGB 13.2  HCT 40.1  PLT 214   BMET:  Recent Labs  07/07/13 0507 07/08/13 0655  NA 138 139  K 4.1 4.1  CL 100 105  CO2 27 27  GLUCOSE 99 99  BUN 27* 24*  CREATININE 1.42* 1.34  CALCIUM 9.2 8.9    CBG (last 3)  No results found for this basename: GLUCAP,  in the last 72 hours PT/INR:   Recent Labs  07/07/13 0507  LABPROT 13.4  INR 1.04    CXR:  N/A  Assessment/Plan:   I have personally reviewed Mr Pullara's TEE and cath films.  He has mitral valve prolapse with a large flail segment (P2) of the posterior leaflet with severe (4+) mitral regurgitation.  LV systolic function is reasonably well preserved and the LV is not particularly dilated.  There is normal RV size and systolic function with trivial TR.   There was no sign of any vegetation nor other signs of bacterial endocarditis.  Cath reveals 3-vessel CAD.  Although there were no critical high-grade stenosis in any of the coronary arteries, he will require CABG at the time of mitral valve repair.  Pulmonary artery pressures were mildly elevated.  Mr Eisenhardt remains in persistent atrial fibrillation with a controlled rate.  He may benefit from maze procedure at the time of surgery.  The rationale for elective mitral valve repair surgery has been explained, including a comparison between surgery and continued medical therapy with close follow-up.  The likelihood of successful and durable valve repair has been discussed with particular reference to the findings of their recent echocardiogram.  Based upon these findings and previous experience, I have quoted them a greater than 95 percent likelihood of successful valve repair.  In the unlikely event that their valve cannot be successfully repaired, we discussed the possibility of replacing the mitral valve using a mechanical prosthesis with the attendant need for long-term anticoagulation versus the alternative of replacing it using a bioprosthetic tissue valve with its potential for late structural valve deterioration and failure, depending upon the patient's longevity.  The patient specifically requests that if the mitral valve must be replaced that it be done  using a bioprosthetic tissue valve.  The indications, risks and benefits associated with coronary artery bypass grafting and maze procedure were also discussed at length.  Clinically Mr Browe remains quite stable.  At present my operating room schedule is already full this week.  I favor starting him on amiodarone and letting him go home with plans to return for surgery on Wednesday July 30th.  I will see him in follow up in the office prior to surgery on Monday July 28th.  Will discuss w/ cardiology team to finalize plans.  All questions have been  addressed.   Purcell Nails 07/08/2013 5:39 PM

## 2013-07-08 NOTE — H&P (View-Only) (Signed)
    Subjective:  Anxious this am, but better now. No CP or dyspnea.  Objective:  Vital Signs in the last 24 hours: Temp:  [97.7 F (36.5 C)-98.1 F (36.7 C)] 97.9 F (36.6 C) (07/16 0500) Pulse Rate:  [70-96] 96 (07/16 0817) Resp:  [18-20] 18 (07/16 0817) BP: (100-162)/(60-118) 162/108 mmHg (07/16 0817) SpO2:  [95 %-96 %] 96 % (07/16 0817) Weight:  [78.155 kg (172 lb 4.8 oz)] 78.155 kg (172 lb 4.8 oz) (07/16 0500)  Intake/Output from previous day: 07/15 0701 - 07/16 0700 In: 613 [P.O.:460; I.V.:3; IV Piggyback:150] Out: 1100 [Urine:1100]  Physical Exam: Pt is alert and oriented, NAD HEENT: normal Neck: JVP - normal Lungs: CTA bilaterally CV: irregular with grade 3/6 holosystolic murmur at the LV apex Abd: soft, NT, Positive BS, no hepatomegaly Ext: no C/C/E, distal pulses intact and equal Skin: warm/dry no rash   Lab Results:  Recent Labs  07/07/13 0507  WBC 7.2  HGB 13.2  PLT 214    Recent Labs  07/07/13 0507 07/08/13 0655  NA 138 139  K 4.1 4.1  CL 100 105  CO2 27 27  GLUCOSE 99 99  BUN 27* 24*  CREATININE 1.42* 1.34   No results found for this basename: TROPONINI, CK, MB,  in the last 72 hours  Cardiac Studies: Cardiac Cath: Hemodynamics:  RA: 10/11/9  RV: 43/6  PCWP: 21/25/21  PA: 43/25 (32)  Cardiac Output  Thermodilution: 3.78 Index of 1.94  Fick : 4.01 Index of 2.06  Arterial Sat: 97%  PA Sat: 61%.  LV pressure: 99/17  Aortic pressure: 106/75  Angiography  Left Main: distal 40% , calcified  Left anterior Descending: proximal 40 % - 60% calcified stenosis. Mid 60-70% stenosis. diags are small - moderate in size and are otherwise normal.  Left Circumflex: moderate sized system. 50 % lesion in OM branch  Ramus Intermediate: Large vessel 40-50% stenosis in proximal vessel.  Right Coronary Artery: large , dominant, mid 70 - 80% (long stenosis) . PDA  LV Gram: normal LV function , EF 55%, Moderate - severe MR  Complications: No apparent  complications  Patient did tolerate procedure well.  Contrast used: 80 cc  Conclusions:  1. CAD involving the LAD and RCA with moderate disease of the Ramus and LCx.  2. Well preserved LV function  3. Moderate - severe MR   Tele: Atrial fibrillation  Assessment/Plan:  1. Acute systolic heart failure, improved 2. Severe MR - for TEE today 3. 2 vessel CAD 4. HTN  Will write for prn hydralazine for BP, but suspect anxiety is driving this today. I reviewed his cath films and suspect he will 2 vessel CABG of the LAD/RCA with his mitral valve surgery. Further plans pending TEE this afternoon. Appreciate Dr Owen's consultation. Note patient is on lovenox and this will need to be stopped 24 hours before cardiac surgery.  Ceria Suminski, M.D. 07/08/2013, 10:31 AM     

## 2013-07-09 ENCOUNTER — Encounter (HOSPITAL_COMMUNITY): Payer: Self-pay | Admitting: Cardiology

## 2013-07-09 ENCOUNTER — Inpatient Hospital Stay (HOSPITAL_COMMUNITY): Payer: BC Managed Care – PPO

## 2013-07-09 ENCOUNTER — Other Ambulatory Visit: Payer: Self-pay | Admitting: *Deleted

## 2013-07-09 DIAGNOSIS — I059 Rheumatic mitral valve disease, unspecified: Secondary | ICD-10-CM

## 2013-07-09 DIAGNOSIS — Z01810 Encounter for preprocedural cardiovascular examination: Secondary | ICD-10-CM

## 2013-07-09 LAB — PULMONARY FUNCTION TEST

## 2013-07-09 LAB — TSH: TSH: 6.633 u[IU]/mL — ABNORMAL HIGH (ref 0.350–4.500)

## 2013-07-09 LAB — CBC
Hemoglobin: 11.9 g/dL — ABNORMAL LOW (ref 13.0–17.0)
RBC: 3.76 MIL/uL — ABNORMAL LOW (ref 4.22–5.81)

## 2013-07-09 LAB — MAGNESIUM: Magnesium: 2.1 mg/dL (ref 1.5–2.5)

## 2013-07-09 LAB — BASIC METABOLIC PANEL
Chloride: 104 mEq/L (ref 96–112)
Creatinine, Ser: 1.36 mg/dL — ABNORMAL HIGH (ref 0.50–1.35)
GFR calc Af Amer: 61 mL/min — ABNORMAL LOW (ref >90)
GFR calc non Af Amer: 52 mL/min — ABNORMAL LOW (ref >90)

## 2013-07-09 MED ORDER — LEVOFLOXACIN 750 MG PO TABS
750.0000 mg | ORAL_TABLET | Freq: Every day | ORAL | Status: DC
  Administered 2013-07-09: 750 mg via ORAL
  Filled 2013-07-09: qty 1

## 2013-07-09 MED ORDER — ACETAMINOPHEN 325 MG PO TABS: 650.0000 mg | ORAL_TABLET | ORAL | Status: DC | PRN

## 2013-07-09 MED ORDER — NITROGLYCERIN 0.4 MG SL SUBL: 0.4000 mg | SUBLINGUAL_TABLET | SUBLINGUAL | Status: DC | PRN

## 2013-07-09 MED ORDER — AMIODARONE HCL 200 MG PO TABS
400.0000 mg | ORAL_TABLET | Freq: Two times a day (BID) | ORAL | Status: DC
  Administered 2013-07-09 (×2): 400 mg via ORAL
  Filled 2013-07-09 (×3): qty 2

## 2013-07-09 MED ORDER — METOPROLOL TARTRATE 25 MG PO TABS: 25.0000 mg | ORAL_TABLET | Freq: Two times a day (BID) | ORAL | Status: DC

## 2013-07-09 MED ORDER — ASPIRIN 325 MG PO TBEC: 325.0000 mg | DELAYED_RELEASE_TABLET | Freq: Every day | ORAL | Status: DC

## 2013-07-09 MED ORDER — LIDOCAINE-EPINEPHRINE 1 %-1:100000 IJ SOLN
10.0000 mL | Freq: Once | INTRAMUSCULAR | Status: DC
  Filled 2013-07-09: qty 10

## 2013-07-09 MED ORDER — ALBUTEROL SULFATE (5 MG/ML) 0.5% IN NEBU
2.5000 mg | INHALATION_SOLUTION | Freq: Once | RESPIRATORY_TRACT | Status: AC
  Administered 2013-07-09: 2.5 mg via RESPIRATORY_TRACT

## 2013-07-09 MED ORDER — ATORVASTATIN CALCIUM 40 MG PO TABS: 40.0000 mg | ORAL_TABLET | Freq: Every day | ORAL | Status: DC

## 2013-07-09 MED ORDER — AMIODARONE HCL 200 MG PO TABS: 400.0000 mg | ORAL_TABLET | Freq: Two times a day (BID) | ORAL | Status: DC

## 2013-07-09 NOTE — Progress Notes (Signed)
Pt had some bleeding at Rt. Groin cardiac cath site last nite.  Dsg D&I>   D. Dunn, PA made aware this am by nite nurse, who informed day shift nurse that PA instructed to hold lovenox and pt to be on bed rest.  Pt made aware by off going shift  Nurse. Amanda Pea, RN

## 2013-07-09 NOTE — Progress Notes (Signed)
Pt made aware that Dr. Excell Seltzer has been notified and will be up to see him shortly.  Pt verbalized understanding.  Amanda Pea, Charity fundraiser.

## 2013-07-09 NOTE — Discharge Summary (Signed)
CARDIOLOGY DISCHARGE SUMMARY   Patient ID: Henry Page MRN: [id number] DOB/AGE: Jul 04, 1946 67 y.o.  Admit date: 07/06/2013 Discharge date: 07/09/2013  Primary Discharge Diagnosis:     Acute combined systolic and diastolic congestive heart failure Secondary Discharge Diagnosis:    Mitral valve prolapse   Mitral regurgitation   Atrial fibrillation   Essential hypertension, benign  Consults: Dr. Cornelius Moras with TCTS.  Procedures: Right heart catheterization, left heart catheterization, left ventriculogram, TEE, PFTs, pre-operative Dopplers  Hospital Course: Henry Page is a 67 y.o. male with no history of CAD. He went to [facility] on 07/04/2013 with palpitations and shortness of breath. He was found to be in SVT. He was treated with diltiazem. He was admitted to [facility]. He was diuresed there with IV Lasix. A 2-D echocardiogram showed an EF of 55% but with severe MR. There was also a possible vegetation so he was started on antibiotics. He was transferred to [facility] for further evaluation and treatment on 07/06/2013.  Because of his severe MR, [facility] consult was called. Dr. ON felt that he needed a right and left heart catheterization and a TEE. Since he was having atrial fibrillation there was concern that this is persistent to and contributing to his heart failure. He therefore recommended amiodarone.  07/07/2013, Mr. [name] was stable for catheterization. The full results are below. He continued to do well and a TEE was performed on 07/08/2013. Results are below on that as well. Since there was no vegetation, the antibiotics can be discontinued.  He remained in atrial fibrillation, his rate generally well-controlled. He is on a low dose of a beta blocker and consideration can be given to up titrate his as an outpatient as needed for better heart rate control. He was on Lovenox for anticoagulation but was not started on oral anticoagulants because of the need for upcoming surgery.  The risks and benefits of anticoagulation were discussed with the patient and his wife. It was felt that since he would need surgery within the next 2 weeks, loading Coumadin would not be beneficial. Additionally, he had problems with a groin ooze after his cath that was difficult to control until after the Lovenox was discontinued. He was started on aspirin at 325 mg daily and further anticoagulation is deferred until after his surgery.  He was seen by cardiac rehabilitation and educated on heart failure restrictions and guidelines as well as information on open-heart surgery recovery. Dr. Cornelius Moras scheduled the surgery but stated the patient could be discharged and come back for a period. He had preoperative Dopplers and pulmonary function test performed.  By 07/09/2013, his volume status had stabilized and planning for surgery etc. was done. He was seen by Dr. Excell Seltzer and considered stable for discharge, to follow up as an outpatient.  Labs:  Lab Results  Component Value Date   WBC 6.5 07/09/2013   HGB 11.9* 07/09/2013   HCT 35.9* 07/09/2013   MCV 95.5 07/09/2013   PLT 210 07/09/2013     Recent Labs Lab 07/08/13 0655 07/09/13 0620  NA 139 138  K 4.1 4.1  CL 105 104  CO2 27 25  BUN 24* 20  CREATININE 1.34 1.36*  CALCIUM 8.9 9.0  PROT 6.7  --   BILITOT 0.5  --   ALKPHOS 81  --   ALT 30  --   AST 31  --   GLUCOSE 99 98   Pro B Natriuretic peptide (BNP)  Date/Time Value Range Status  07/07/2013  5:07  AM 1195.0* 0 - 125 pg/mL Final    Recent Labs  07/07/13 0507  INR 1.04      Radiology: Dg Chest 2 View 07/09/2013   *RADIOLOGY REPORT*  Clinical Data: Follow up congestive heart failure.  CHEST - 2 VIEW  Comparison: 07/05/2013  Findings: Pulmonary edema has resolved.  There are minimal bilateral pleural effusions.  The lungs are clear.  The cardiac silhouette is mildly enlarged.  No mediastinal or hilar masses.  IMPRESSION: Resolved pulmonary edema.  Minimal bilateral pleural effusions.    Original Report Authenticated By: Amie Portland, M.D.   Cardiac Cath: 07/07/2013 RA: 10/11/9  RV: 43/6  PCWP: 21/25/21  PA: 43/25 (32)  Cardiac Output  Thermodilution: 3.78 Index of 1.94  Fick : 4.01 Index of 2.06  Arterial Sat: 97%  PA Sat: 61%.  LV pressure: 99/17  Aortic pressure: 106/75  Angiography  Left Main: distal 40% , calcified  Left anterior Descending: proximal 40 % - 60% calcified stenosis. Mid 60-70% stenosis. diags are small - moderate in size and are otherwise normal.  Left Circumflex: moderate sized system. 50 % lesion in OM branch  Ramus Intermediate: Large vessel 40-50% stenosis in proximal vessel.  Right Coronary Artery: large , dominant, mid 70 - 80% (long stenosis) . PDA  LV Gram: normal LV function , EF 55%, Moderate - severe MR  Complications: No apparent complications  Patient did tolerate procedure well.  Contrast used: 80 cc  Conclusions:  1. CAD involving the LAD and RCA with moderate disease of the Ramus and LCx.  2. Well preserved LV function  3. Moderate - severe MR   EKG: 07/07/2013 Atrial fibrillation  Vent. rate 81 BPM PR interval * ms QRS duration 102 ms QT/QTc 398/462 ms P-R-T axes * -4 23  Echo: 07/08/2013 - Left ventricle: The cavity size was normal. There was mild concentric hypertrophy. Systolic function was normal. The estimated ejection fraction was in the range of 55% to 60%. Wall motion was normal; there were no regional wall motion abnormalities. - Aortic valve: There was no stenosis. - Aorta: Normal caliber, mild plaque. 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 - Left atrium: The atrium was moderately dilated. No evidence of thrombus in the atrial cavity or appendage. - Pulmonary veins: Systolic flow reversal in pulmonary vein doppler pattern. - Right ventricle: The cavity size was normal. Systolic function was normal. - Right atrium:  No evidence of thrombus in the atrial cavity or appendage.   FOLLOW UP PLANS AND APPOINTMENTS Allergies  Allergen Reactions  . Meperidine And Related Itching  . Shellfish Allergy   . Sulfa Antibiotics     "very sick"     Medication List         acetaminophen 325 MG tablet  Commonly known as:  TYLENOL  Take 2 tablets (650 mg total) by mouth every 4 (four) hours as needed.     amiodarone 200 MG tablet  Commonly known as:  PACERONE  Take 2 tablets (400 mg total) by mouth 2 (two) times daily after a meal.     aspirin 325 MG EC tablet  Take 1 tablet (325 mg total) by mouth daily.     atorvastatin 40 MG tablet  Commonly known as:  LIPITOR  Take 1 tablet (40 mg total) by mouth daily at 6 PM.     metoprolol tartrate 25 MG tablet  Commonly known as:  LOPRESSOR  Take 1 tablet (25  mg total) by mouth 2 (two) times daily.     nitroGLYCERIN 0.4 MG SL tablet  Commonly known as:  NITROSTAT  Place 1 tablet (0.4 mg total) under the tongue every 5 (five) minutes x 3 doses as needed for chest pain.        Discharge Orders   Future Appointments Provider Department Dept Phone   07/20/2013 9:00 AM [place] [facility] SAME DAY SURGERY [phone]   07/20/2013 11:30 AM Purcell Nails, MD [facility] [phone]   Future Orders Complete By Expires     Diet - low sodium heart healthy  As directed     Increase activity slowly  As directed       Follow-up Information   Follow up with [facility] On 07/20/2013. (Go to same day surgery at 9 AM)    Contact information:   [address] [place] [address] [phone]      Follow up with Purcell Nails, MD On 07/20/2013. (At 11:30 AM)    Contact information:   [address] [place] [address] [phone]       BRING ALL MEDICATIONS WITH YOU TO FOLLOW UP APPOINTMENTS  Time spent with patient to include physician time: 42  min Signed: Theodore Demark, PA-C 07/09/2013, 5:37 PM Co-Sign MD

## 2013-07-09 NOTE — Progress Notes (Signed)
All d/c instructions explained and given to pt.  Verbalized understanding.  D/C off floor via w/c by assigned NT.  Escorted by wife.  Amanda Pea, Charity fundraiser.

## 2013-07-09 NOTE — Progress Notes (Addendum)
[facility] BRIEF PROGRESS NOTE   Plans for discharge noted. We plan surgery on Wednesday July 30. I will see Henry Page in the office on Monday July 28 prior to surgery.  He will also go to Short Stay that day for pre-admission testing. Instructions given  Purcell Nails 07/09/2013 5:17 PM

## 2013-07-09 NOTE — Progress Notes (Signed)
Small amount of blood from right groin cath site this AM. Pressure applied to site and gauze applied to site. Will continue to monitor pt. Baron Hamper, RN 07/09/2013

## 2013-07-09 NOTE — Progress Notes (Signed)
Pt's Rt. Groin dsg. Remain D&I prior to d/c home.  Amanda Pea, RN

## 2013-07-09 NOTE — Progress Notes (Signed)
Pt with 12 beat run of Vtach tonight. BP 133/91, HR in 90s. Pt asymptomatic. On call MD Tresa Endo notified and new order for BMET and Mg to be drawn in the morning. Pt educated on Vtach and labs for AM and verbalized understanding. Will continue to monitor pt. Baron Hamper, RN 07/09/2013 1:50 AM

## 2013-07-09 NOTE — Progress Notes (Signed)
VASCULAR LAB PRELIMINARY  PRELIMINARY  PRELIMINARY  PRELIMINARY  Pre-op Cardiac Surgery  Carotid Findings:  Bilateral:  Less than 39% ICA stenosis.  Vertebral artery flow is antegrade.      Upper Extremity Right Left  Brachial Pressures 158 triphasic 151 triphasic  Radial Waveforms monophasic triphasic  Ulnar Waveforms triphasic triphasic  Palmar Arch (Allen's Test) * **   Findings:  *Right:  Doppler waveforms with ulnar and remains normal with radial compressions.  **Left:  Doppler waveforms obliterate with radial and remain normal with ulnar compressions.    Lower  Extremity Right Left  Dorsalis Pedis    Anterior Tibial    Posterior Tibial    Ankle/Brachial Indices      Findings:  Palpable pedal pulses x 4.   Velda Wendt, RVT 07/09/2013, 11:33 AM

## 2013-07-09 NOTE — Progress Notes (Signed)
Dunn PA notified of hematoma in right groin site. Ordered to hold off on lovenox for now and keep pt on bedrest until attending MD rounds on pt. Baron Hamper, RN 07/09/2013

## 2013-07-09 NOTE — Progress Notes (Signed)
Right groin site examined and no further bleeding noted. Encourage pt to keep  HOB at 30 degrees and to call for assistance, especially if he has to get up to use bath room.  Amanda Pea, Charity fundraiser.

## 2013-07-09 NOTE — Plan of Care (Signed)
Problem: Discharge Progression Outcomes Goal: Pain controlled with appropriate interventions Outcome: Completed/Met Date Met:  07/09/13 C/o soreness Rt . Groin.  Declined pain pill

## 2013-07-09 NOTE — Progress Notes (Signed)
Patient Name: Henry Page Date of Encounter: 07/09/2013  Principal Problem:   Acute combined systolic and diastolic congestive heart failure Active Problems:   Mitral valve prolapse   Mitral regurgitation   Atrial fibrillation   Essential hypertension, benign    SUBJECTIVE: No chest pain or SOB. Groin oozing since last pm. Started without strain or exertion. Has a dry cough, new.  OBJECTIVE Filed Vitals:   07/08/13 1420 07/08/13 2124 07/09/13 0138 07/09/13 0635  BP: 112/77 126/92 133/91 131/89  Pulse:  103 91 80  Temp:  98.1 F (36.7 C)  97.9 F (36.6 C)  TempSrc:  Oral  Oral  Resp: 23 18  18   Height:      Weight:    172 lb 9.6 oz (78.291 kg)  SpO2: 97% 97%  97%    Intake/Output Summary (Last 24 hours) at 07/09/13 0907 Last data filed at 07/09/13 0817  Gross per 24 hour  Intake    630 ml  Output    825 ml  Net   -195 ml   Filed Weights   07/07/13 0734 07/08/13 0500 07/09/13 0635  Weight: 169 lb 1.5 oz (76.7 kg) 172 lb 4.8 oz (78.155 kg) 172 lb 9.6 oz (78.291 kg)    PHYSICAL EXAM General: Well developed, well nourished, male in no acute distress. Head: Normocephalic, atraumatic.  Neck: Supple without bruits, JVD not elevated. Lungs:  Resp regular and unlabored, dry rales. Heart: S1, S2, no S3, S4, 3/6 murmur; no rub. Abdomen: Soft, non-tender, non-distended, BS + x 4.  Extremities: No clubbing, cyanosis, no edema. Groin with superficial oozing but no hematoma Neuro: Alert and oriented X 3. Moves all extremities spontaneously. Psych: Normal affect.  LABS: CBC: Recent Labs  07/07/13 0507 07/09/13 0035  WBC 7.2 6.5  HGB 13.2 11.9*  HCT 40.1 35.9*  MCV 95.2 95.5  PLT 214 210   INR: Recent Labs  07/07/13 0507  INR 1.04   Basic Metabolic Panel: Recent Labs  07/08/13 0655 07/09/13 0620  NA 139 138  K 4.1 4.1  CL 105 104  CO2 27 25  GLUCOSE 99 98  BUN 24* 20  CREATININE 1.34 1.36*  CALCIUM 8.9 9.0  MG  --  2.1   Liver Function  Tests: Recent Labs  07/08/13 0655  AST 31  ALT 30  ALKPHOS 81  BILITOT 0.5  PROT 6.7  ALBUMIN 3.1*   BNP: Pro B Natriuretic peptide (BNP)  Date/Time Value Range Status  07/07/2013  5:07 AM 1195.0* 0 - 125 pg/mL Final   Thyroid Function Tests: Recent Labs  07/08/13 1833  TSH 6.633*   TELE:  Atrial fib, rate frequently > 100. Rarely drops low while asleep.     ECHO: Normal LV size and systolic function, EF 55-60%. Moderate left atrial enlargement. Normal RV size and systolic function. 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 There was systolic flow reversal in the pulmonic vein doppler profile.    Radiology/Studies: Dg Chest 2 View  07/09/2013   *RADIOLOGY REPORT*  Clinical Data: Follow up congestive heart failure.  CHEST - 2 VIEW  Comparison: 07/05/2013  Findings: Pulmonary edema has resolved.  There are minimal bilateral pleural effusions.  The lungs are clear.  The cardiac silhouette is mildly enlarged.  No mediastinal or hilar masses.  IMPRESSION: Resolved pulmonary edema.  Minimal bilateral pleural effusions.   Original Report Authenticated By: Amie Portland, M.D.     Current Medications:  . amiodarone  400 mg Oral  BID PC  . aspirin EC  81 mg Oral Daily  . atorvastatin  80 mg Oral q1800  . enoxaparin (LOVENOX) injection  80 mg Subcutaneous Q12H  . levofloxacin  750 mg Oral Q1500  . metoprolol tartrate  25 mg Oral BID   . sodium chloride    . sodium chloride 10 mL/hr at 07/08/13 0114    ASSESSMENT AND PLAN: Principal Problem:   Acute combined systolic and diastolic congestive heart failure - Volume status OK, EF preserved on TEE. MD advise on daily diuretic or PRN Lasix.  Active Problems:   Mitral valve prolapse - for surgery in 2 weeks     Mitral regurgitation - See above    Atrial fibrillation - amiodarone loading, Lopressor 25 mg BID. Consider increasing AM dose to 50 mg for better rate control.    Essential hypertension, benign - SBP 99-161 last  24 hours. Lowest readings during and just after TEE.    Anticoagulation - CHADsVASc is 3, but is having surgery in 2 weeks. Has never had an event. Can change ASA to 325 mg and add anticoag after surgery.    Groin oozing - prolonged despite pressure held and pressure dressing. Will inject epi/lido to facilitate d/c later today.  Plan - d/c after PFTs, groin injection.    Signed, Theodore Demark , PA-C 9:07 AM 07/09/2013  Patient seen, examined. Available data reviewed. Agree with findings, assessment, and plan as outlined by Theodore Demark, PA-C. Exam reveals an alert, oriented male in no distress. Lungs are clear. Heart is irregular with a grade 3/6 holosystolic murmur at the left sternal border and apex. The right groin shows no hematoma, but there is continued slow oozing from his access site. He is clinically stable. I have reviewed his TEE as well as the notes from Dr. Cornelius Moras. Plans are for hospital discharge and outpatient cardiac surgery in approximately 2 weeks. His right groin site will be injected with lidocaine/epinephrine. As long as this is stable he can be discharged home later today. Will plan on sending him home on amiodarone 400 mg twice daily so that he is fully loaded when he presents for surgery. I suspect the groin oozing is related to treatment dose Lovenox. Considering risk/benefit of short-term anticoagulation, I am going to stop his warfarin until surgery. This would be stopped in one week anyway. The patient had no smoke or thrombus by TEE yesterday and has no prior history of stroke or TIA. He will start aspirin 325 mg daily. Followup is are you range in the cardiac surgery office with Dr Cornelius Moras.   Tonny Bollman, M.D. 07/09/2013 11:53 AM

## 2013-07-09 NOTE — Progress Notes (Signed)
1500-1540 Cardiac Rehab Pt is on bedrest from groin ozzing, so not appro for ambulation. Completed pre op education with pt and wife. Gave pt pre-op heart surgery booklet and encouraged them to watch preparing for heart surgery video. Discussed with pt sternal precautions with getting in and out of chair and bed, use of IS, post op ambulation and that he would need 24/7 care at home the first week after surgery. Pt wife states that she can provide that. Also discussed CHF with them and gave them CHF packet. Discussed zones and when to call MD and 911. They voice understanding. Melina Copa RN

## 2013-07-09 NOTE — Progress Notes (Signed)
Pt HR greater than 120 as notified by monitor tech.  Pain denies pain discomfort.  BP & P stable see VS section. R. Barrett, notified.  No new order.  Amanda Pea, Charity fundraiser.

## 2013-07-09 NOTE — Progress Notes (Signed)
Instructed by R. Barrett, Pa, that it is ok for pt to get off bed rest and can go off floor for PFT.  Tammy, in PFT notified pt is ready for test.  Amanda Pea, RN.

## 2013-07-09 NOTE — Progress Notes (Signed)
Pt currently on bedrest for Rt groin cath site, post  bleeding last nite and   Dsg D&I this am.  Placed on bedrest, Insisted on getting OOB to use BR to have a BM.  Instructed not get OOB until PA  Notified.   Spoke with D. Dunn instructed not to let pt get OOB, if he does is against medical advise and that Dr. Excell Seltzer will see him this am.  Pt  Informed and offered bedpan, refused it.  States "I just wait until he doctor comes."  Will continue to monitor.  Amanda Pea, Charity fundraiser.

## 2013-07-09 NOTE — Progress Notes (Signed)
Noted bleeding at pt's Rt. Groin site dsg with moderate amt of blooding.  Pt denies pain and says just soreness.  Declined pain med.   R Barrett, PA notified and instructed has ordered lidocaine to be applied and to call when it arrived. Call pharmacy to send med.  Will continue to monitor. Amanda Pea, Charity fundraiser.

## 2013-07-09 NOTE — Progress Notes (Signed)
Pressure applied to pt's Rt. Groin to stopped bleeding.  Noted bleeding subsided and 4x4 dsg. Applied with medipore tape.  Instructed pt to keep Rt. Leg straight to prevent further bleeding.  Voiced understanding.  Wife at bedside made aware also.  R Barrett, PA made aware and also that lidocaine inj. Is now on the floor.  Instructed that she will be up in few minutes.  Pt made aware.  Will continue to monitor.  Amanda Pea, Charity fundraiser.

## 2013-07-10 ENCOUNTER — Encounter (HOSPITAL_COMMUNITY): Payer: Self-pay | Admitting: Pharmacy Technician

## 2013-07-10 ENCOUNTER — Other Ambulatory Visit: Payer: Self-pay | Admitting: *Deleted

## 2013-07-10 DIAGNOSIS — I2510 Atherosclerotic heart disease of native coronary artery without angina pectoris: Secondary | ICD-10-CM

## 2013-07-10 DIAGNOSIS — I059 Rheumatic mitral valve disease, unspecified: Secondary | ICD-10-CM

## 2013-07-16 ENCOUNTER — Telehealth: Payer: Self-pay | Admitting: Cardiovascular Disease

## 2013-07-16 NOTE — Telephone Encounter (Signed)
Returned call to patient's wife she stated husband has a sinus infection.Stated he has a drainage in the back of throat that makes him cough.Advised needs to call PCP.

## 2013-07-16 NOTE — Telephone Encounter (Signed)
New prob  Pt wife states husband seems be having a sinus infection.  She is worried because he is schedule to have surgery on July 30 with Dr Barry Dienes.  Wife said she called Dr Barry Dienes office but he is on vacation and was advised to call here.  Pt has a previous chart with the wrong date of birth, where he he was a pt of Dr Excell Seltzer.

## 2013-07-17 ENCOUNTER — Telehealth: Payer: Self-pay | Admitting: Internal Medicine

## 2013-07-17 NOTE — Telephone Encounter (Signed)
Mrs. Henry Page is calling stating that she feels her husband needs to be seen this afternoon In the office. States he is sweating having congestion with a cough.  She is afraid that something is going To happen to him.

## 2013-07-17 NOTE — Telephone Encounter (Signed)
New Prob     States pt is having some side effects to AMIODARONE (persistent cough). Would like to speak to nurse regarding this.

## 2013-07-17 NOTE — Telephone Encounter (Signed)
Patient c/o a lot of head and chest congestion, sob, coughing a lot, not able to sleep from coughing so bad, or eat. Patient also having sweating. No fever or chills. No chest pain or dizziness. No LLE. Nurse informed wife that patient needed to call his PCP for evaluation or go to ED for evaluation. Wife verbalized understanding of plan.

## 2013-07-20 ENCOUNTER — Ambulatory Visit (INDEPENDENT_AMBULATORY_CARE_PROVIDER_SITE_OTHER): Payer: BC Managed Care – PPO | Admitting: Thoracic Surgery (Cardiothoracic Vascular Surgery)

## 2013-07-20 ENCOUNTER — Ambulatory Visit (HOSPITAL_COMMUNITY): Admission: RE | Admit: 2013-07-20 | Payer: BC Managed Care – PPO | Source: Ambulatory Visit

## 2013-07-20 ENCOUNTER — Encounter (HOSPITAL_COMMUNITY): Payer: Self-pay

## 2013-07-20 ENCOUNTER — Encounter (HOSPITAL_COMMUNITY)
Admit: 2013-07-20 | Discharge: 2013-07-20 | Disposition: A | Discharge status: Home / Self Care | Payer: BC Managed Care – PPO | Attending: Thoracic Surgery (Cardiothoracic Vascular Surgery) | Admitting: Thoracic Surgery (Cardiothoracic Vascular Surgery)

## 2013-07-20 ENCOUNTER — Encounter: Payer: Self-pay | Admitting: Thoracic Surgery (Cardiothoracic Vascular Surgery)

## 2013-07-20 ENCOUNTER — Inpatient Hospital Stay (HOSPITAL_COMMUNITY)
Admission: RE | Admit: 2013-07-20 | Discharge: 2013-07-31 | DRG: 545 | Disposition: A | Discharge status: Home / Self Care | Payer: BC Managed Care – PPO | Source: Ambulatory Visit | Attending: Thoracic Surgery (Cardiothoracic Vascular Surgery) | Admitting: Thoracic Surgery (Cardiothoracic Vascular Surgery)

## 2013-07-20 ENCOUNTER — Inpatient Hospital Stay (HOSPITAL_COMMUNITY): Payer: BC Managed Care – PPO

## 2013-07-20 VITALS — BP 132/88 | HR 84 | Temp 97.6°F | Resp 20 | Ht 70.0 in | Wt 178.7 lb

## 2013-07-20 VITALS — BP 145/103 | HR 103 | Resp 20 | Ht 70.0 in | Wt 178.0 lb

## 2013-07-20 DIAGNOSIS — I498 Other specified cardiac arrhythmias: Secondary | ICD-10-CM | POA: Diagnosis not present

## 2013-07-20 DIAGNOSIS — K5900 Constipation, unspecified: Secondary | ICD-10-CM | POA: Diagnosis not present

## 2013-07-20 DIAGNOSIS — Z91013 Allergy to seafood: Secondary | ICD-10-CM

## 2013-07-20 DIAGNOSIS — F411 Generalized anxiety disorder: Secondary | ICD-10-CM | POA: Diagnosis present

## 2013-07-20 DIAGNOSIS — Q211 Atrial septal defect: Secondary | ICD-10-CM

## 2013-07-20 DIAGNOSIS — Z7982 Long term (current) use of aspirin: Secondary | ICD-10-CM

## 2013-07-20 DIAGNOSIS — Z8679 Personal history of other diseases of the circulatory system: Secondary | ICD-10-CM

## 2013-07-20 DIAGNOSIS — Z9889 Other specified postprocedural states: Secondary | ICD-10-CM

## 2013-07-20 DIAGNOSIS — Z882 Allergy status to sulfonamides status: Secondary | ICD-10-CM

## 2013-07-20 DIAGNOSIS — I5041 Acute combined systolic (congestive) and diastolic (congestive) heart failure: Secondary | ICD-10-CM

## 2013-07-20 DIAGNOSIS — N183 Chronic kidney disease, stage 3 unspecified: Secondary | ICD-10-CM

## 2013-07-20 DIAGNOSIS — J40 Bronchitis, not specified as acute or chronic: Secondary | ICD-10-CM | POA: Diagnosis present

## 2013-07-20 DIAGNOSIS — I059 Rheumatic mitral valve disease, unspecified: Secondary | ICD-10-CM

## 2013-07-20 DIAGNOSIS — D62 Acute posthemorrhagic anemia: Secondary | ICD-10-CM | POA: Diagnosis not present

## 2013-07-20 DIAGNOSIS — I341 Nonrheumatic mitral (valve) prolapse: Secondary | ICD-10-CM | POA: Diagnosis present

## 2013-07-20 DIAGNOSIS — I340 Nonrheumatic mitral (valve) insufficiency: Secondary | ICD-10-CM

## 2013-07-20 DIAGNOSIS — Z794 Long term (current) use of insulin: Secondary | ICD-10-CM

## 2013-07-20 DIAGNOSIS — I129 Hypertensive chronic kidney disease with stage 1 through stage 4 chronic kidney disease, or unspecified chronic kidney disease: Secondary | ICD-10-CM | POA: Diagnosis present

## 2013-07-20 DIAGNOSIS — I2510 Atherosclerotic heart disease of native coronary artery without angina pectoris: Secondary | ICD-10-CM

## 2013-07-20 DIAGNOSIS — Q2111 Secundum atrial septal defect: Secondary | ICD-10-CM

## 2013-07-20 DIAGNOSIS — Z79899 Other long term (current) drug therapy: Secondary | ICD-10-CM

## 2013-07-20 DIAGNOSIS — E875 Hyperkalemia: Secondary | ICD-10-CM | POA: Diagnosis not present

## 2013-07-20 DIAGNOSIS — Z951 Presence of aortocoronary bypass graft: Secondary | ICD-10-CM

## 2013-07-20 DIAGNOSIS — I480 Paroxysmal atrial fibrillation: Secondary | ICD-10-CM | POA: Diagnosis present

## 2013-07-20 DIAGNOSIS — G4730 Sleep apnea, unspecified: Secondary | ICD-10-CM | POA: Diagnosis present

## 2013-07-20 DIAGNOSIS — I10 Essential (primary) hypertension: Secondary | ICD-10-CM | POA: Diagnosis present

## 2013-07-20 DIAGNOSIS — I4891 Unspecified atrial fibrillation: Secondary | ICD-10-CM

## 2013-07-20 DIAGNOSIS — I509 Heart failure, unspecified: Secondary | ICD-10-CM | POA: Diagnosis present

## 2013-07-20 DIAGNOSIS — I5043 Acute on chronic combined systolic (congestive) and diastolic (congestive) heart failure: Secondary | ICD-10-CM

## 2013-07-20 HISTORY — DX: Personal history of other diseases of the circulatory system: Z86.79

## 2013-07-20 HISTORY — DX: Chronic kidney disease, stage 3 unspecified: N18.30

## 2013-07-20 HISTORY — DX: Other specified postprocedural states: Z98.890

## 2013-07-20 HISTORY — DX: Anxiety disorder, unspecified: F41.9

## 2013-07-20 HISTORY — DX: Presence of aortocoronary bypass graft: Z95.1

## 2013-07-20 HISTORY — DX: Chronic kidney disease, stage 3 (moderate): N18.3

## 2013-07-20 LAB — CBC WITH DIFFERENTIAL/PLATELET
Basophils Absolute: 0 10*3/uL (ref 0.0–0.1)
Lymphocytes Relative: 15 % (ref 12–46)
Lymphs Abs: 1.8 10*3/uL (ref 0.7–4.0)
Neutrophils Relative %: 75 % (ref 43–77)
Platelets: 303 10*3/uL (ref 150–400)
RBC: 3.99 MIL/uL — ABNORMAL LOW (ref 4.22–5.81)
RDW: 15.6 % — ABNORMAL HIGH (ref 11.5–15.5)
WBC: 12.1 10*3/uL — ABNORMAL HIGH (ref 4.0–10.5)

## 2013-07-20 LAB — BLOOD GAS, ARTERIAL
Acid-base deficit: 3.8 mmol/L — ABNORMAL HIGH (ref 0.0–2.0)
Drawn by: 344381
Patient temperature: 98.6
TCO2: 20.6 mmol/L (ref 0–100)
pH, Arterial: 7.436 (ref 7.350–7.450)

## 2013-07-20 LAB — COMPREHENSIVE METABOLIC PANEL
ALT: 41 U/L (ref 0–53)
ALT: 38 U/L (ref 0–53)
AST: 32 U/L (ref 0–37)
AST: 28 U/L (ref 0–37)
Alkaline Phosphatase: 100 U/L (ref 39–117)
Alkaline Phosphatase: 100 U/L (ref 39–117)
CO2: 21 mEq/L (ref 19–32)
Calcium: 9.3 mg/dL (ref 8.4–10.5)
GFR calc Af Amer: 47 mL/min — ABNORMAL LOW (ref >90)
GFR calc Af Amer: 46 mL/min — ABNORMAL LOW (ref >90)
GFR calc non Af Amer: 41 mL/min — ABNORMAL LOW (ref >90)
Glucose, Bld: 113 mg/dL — ABNORMAL HIGH (ref 70–99)
Potassium: 3.9 mEq/L (ref 3.5–5.1)
Sodium: 140 mEq/L (ref 135–145)
Total Bilirubin: 0.8 mg/dL (ref 0.3–1.2)
Total Protein: 6.7 g/dL (ref 6.0–8.3)

## 2013-07-20 LAB — URINALYSIS, ROUTINE W REFLEX MICROSCOPIC
Glucose, UA: NEGATIVE mg/dL
Hgb urine dipstick: NEGATIVE
Ketones, ur: NEGATIVE mg/dL
Leukocytes, UA: NEGATIVE
Protein, ur: 100 mg/dL — AB
pH: 5.5 (ref 5.0–8.0)

## 2013-07-20 LAB — CBC
HCT: 39.8 % (ref 39.0–52.0)
MCHC: 32.9 g/dL (ref 30.0–36.0)
MCV: 97.3 fL (ref 78.0–100.0)
RDW: 15.6 % — ABNORMAL HIGH (ref 11.5–15.5)

## 2013-07-20 LAB — SURGICAL PCR SCREEN: MRSA, PCR: NEGATIVE

## 2013-07-20 LAB — APTT: aPTT: 28 seconds (ref 24–37)

## 2013-07-20 LAB — URINE MICROSCOPIC-ADD ON

## 2013-07-20 MED ORDER — ACETAMINOPHEN 325 MG PO TABS: 650.0000 mg | ORAL_TABLET | ORAL | Status: DC | PRN

## 2013-07-20 MED ORDER — AZELASTINE HCL 0.1 % NA SOLN
1.0000 | Freq: Every day | NASAL | Status: DC | PRN
Start: 2013-07-20 — End: 2013-07-22
  Filled 2013-07-20: qty 30

## 2013-07-20 MED ORDER — HEPARIN SODIUM (PORCINE) 5000 UNIT/ML IJ SOLN
5000.0000 [IU] | Freq: Three times a day (TID) | INTRAMUSCULAR | Status: AC
  Administered 2013-07-20 – 2013-07-21 (×2): 5000 [IU] via SUBCUTANEOUS
  Filled 2013-07-20 (×5): qty 1

## 2013-07-20 MED ORDER — ATORVASTATIN CALCIUM 40 MG PO TABS
40.0000 mg | ORAL_TABLET | Freq: Every day | ORAL | Status: DC
  Administered 2013-07-20 – 2013-07-21 (×2): 40 mg via ORAL
  Filled 2013-07-20 (×3): qty 1

## 2013-07-20 MED ORDER — ONDANSETRON HCL 4 MG/2ML IJ SOLN: 4.0000 mg | Freq: Four times a day (QID) | INTRAMUSCULAR | Status: DC | PRN

## 2013-07-20 MED ORDER — ALPRAZOLAM 0.5 MG PO TABS
0.5000 mg | ORAL_TABLET | Freq: Two times a day (BID) | ORAL | Status: DC | PRN
  Administered 2013-07-21: 0.5 mg via ORAL
  Filled 2013-07-20: qty 1

## 2013-07-20 MED ORDER — SODIUM CHLORIDE 0.9 % IJ SOLN: 3.0000 mL | INTRAMUSCULAR | Status: DC | PRN

## 2013-07-20 MED ORDER — FUROSEMIDE 10 MG/ML IJ SOLN
40.0000 mg | Freq: Once | INTRAMUSCULAR | Status: AC
  Administered 2013-07-20: 40 mg via INTRAVENOUS
  Filled 2013-07-20: qty 4

## 2013-07-20 MED ORDER — METOPROLOL TARTRATE 25 MG PO TABS
25.0000 mg | ORAL_TABLET | Freq: Two times a day (BID) | ORAL | Status: DC
  Administered 2013-07-20 – 2013-07-21 (×3): 25 mg via ORAL
  Filled 2013-07-20 (×6): qty 1

## 2013-07-20 MED ORDER — SODIUM CHLORIDE 0.9 % IJ SOLN
3.0000 mL | Freq: Two times a day (BID) | INTRAMUSCULAR | Status: DC
  Administered 2013-07-20 – 2013-07-21 (×3): 3 mL via INTRAVENOUS

## 2013-07-20 MED ORDER — ASPIRIN EC 81 MG PO TBEC
81.0000 mg | DELAYED_RELEASE_TABLET | Freq: Every day | ORAL | Status: DC
  Administered 2013-07-21: 81 mg via ORAL
  Filled 2013-07-20 (×2): qty 1

## 2013-07-20 MED ORDER — AMOXICILLIN 500 MG PO CAPS
500.0000 mg | ORAL_CAPSULE | Freq: Three times a day (TID) | ORAL | Status: DC
  Administered 2013-07-20 – 2013-07-21 (×5): 500 mg via ORAL
  Filled 2013-07-20 (×8): qty 1

## 2013-07-20 MED ORDER — SODIUM CHLORIDE 0.9 % IV SOLN: 250.0000 mL | INTRAVENOUS | Status: DC | PRN

## 2013-07-20 MED ORDER — AMIODARONE HCL 200 MG PO TABS
400.0000 mg | ORAL_TABLET | Freq: Two times a day (BID) | ORAL | Status: DC
  Administered 2013-07-20 – 2013-07-21 (×3): 400 mg via ORAL
  Filled 2013-07-20 (×6): qty 2

## 2013-07-20 MED ORDER — NITROGLYCERIN 0.4 MG SL SUBL: 0.4000 mg | SUBLINGUAL_TABLET | SUBLINGUAL | Status: DC | PRN

## 2013-07-20 MED ORDER — FUROSEMIDE NICU IV SYRINGE 10 MG/ML
40.0000 mg | Freq: Once | INTRAMUSCULAR | Status: DC
  Filled 2013-07-20: qty 4

## 2013-07-20 NOTE — Progress Notes (Signed)
Patient ID: Henry Page, male   DOB: 10/25/1947, 67 y.o.   MRN: [id number]   Patient ID: Henry Page MRN: [id number], DOB/AGE: 67/01/1947   Admit date: 07/20/2013   Primary Physician: Ignatius Specking., MD Primary Cardiologist: Dietrich Pates MD  Pt. Profile:  Pleasant 54 or old married white male admitted for acute on chronic combined congestive heart failure in preparation for surgery on July 30.  Problem List  Past Medical History  Diagnosis Date  . Mitral valve prolapse 07/06/2013  . Mitral regurgitation 07/06/2013  . Atrial fibrillation 07/06/2013  . Essential hypertension, benign 07/06/2013  . 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 Heart murmur   . Sleep apnea   . Anxiety   . Shortness of breath   . Kidney stones     some passed spontaneous, one retrieved by cystoscope     Past Surgical History  Procedure Laterality Date  . Tonsillectomy    . Tee without cardioversion N/A 07/08/2013    Procedure: TRANSESOPHAGEAL ECHOCARDIOGRAM (TEE);  Surgeon: Laurey Morale, MD;  Location: [facility] ENDOSCOPY;  Service: Cardiovascular;  Laterality: N/A;  . Cystoscopy       Allergies  Allergies  Allergen Reactions  . Meperidine And Related Itching  . Shellfish Allergy     flulike symptoms   . Sulfa Antibiotics     "very sick"    HPI  Please see recent discharge summary and evaluation. In summary, patient was seen by Dr. Cornelius Moras today for preop assessment. He is to have coronary bypass grafting, mitral valve repair, and a Maze procedure on Wednesday, July 30.  He has been having a cough and some shortness of breath. His primary care physician treated him with a steroid shot and has been on 20 mg of prednisone for the last 3 days. He did not take it today. He is also on amoxicillin. He was concerned he may have a sinus infection. His white count is elevated to 14,000. Other blood work unremarkable except his blood gas showing a PO2 of 58.6 with a 98.6  saturation  Patient denies any purulent sputum. He's had no fever or chills. He denies orthopnea. He thinks his weight is up 4 pounds. He has minimal edema.  Home Medications  Prior to Admission medications   Medication Sig Start Date End Date Taking? Authorizing Provider  acetaminophen (TYLENOL) 325 MG tablet Take 2 tablets (650 mg total) by mouth every 4 (four) hours as needed. 07/09/13  Yes Rhonda G Barrett, PA-C  amiodarone (PACERONE) 200 MG tablet Take 2 tablets (400 mg total) by mouth 2 (two) times daily after a meal. 07/09/13  Yes Rhonda G Barrett, PA-C  aspirin EC 325 MG EC tablet Take 1 tablet (325 mg total) by mouth daily. 07/09/13  Yes Rhonda G Barrett, PA-C  atorvastatin (LIPITOR) 40 MG tablet Take 1 tablet (40 mg total) by mouth daily at 6 PM. 07/09/13  Yes Rhonda G Barrett, PA-C  desoximetasone (TOPICORT) 0.25 % cream Apply 1 application topically daily as needed.   Yes Historical Provider, MD  DM-APAP-CPM (CORICIDIN HBP FLU PO) Take 1 tablet by mouth daily as needed (for sinus infection).   Yes Historical Provider, MD  metoprolol tartrate (LOPRESSOR) 25 MG tablet Take 1 tablet (25 mg total) by mouth 2 (two) times daily. 07/09/13  Yes Rhonda G Barrett, PA-C  nitroGLYCERIN (NITROSTAT) 0.4 MG SL tablet Place 1 tablet (0.4 mg total) under the tongue every 5 (five) minutes x 3 doses as  needed for chest pain. 07/09/13  Yes Rhonda G Barrett, PA-C  ALPRAZolam (XANAX) 0.5 MG tablet Take 0.5 mg by mouth 2 (two) times daily as needed for sleep.    Historical Provider, MD  amoxicillin (AMOXIL) 500 MG capsule Take 500 mg by mouth 3 (three) times daily.    Historical Provider, MD  azelastine (ASTELIN) 137 MCG/SPRAY nasal spray Place 1 spray into the nose daily as needed for rhinitis. Use in each nostril as directed    Historical Provider, MD    Family History  No family history on file.  Social History  History   Social History  . Marital Status: Married    Spouse Name: N/A    Number of  Children: N/A  . Years of Education: N/A   Occupational History  . sales    Social History Main Topics  . Smoking status: Never Smoker   . Smokeless tobacco: Not on file  . Alcohol Use: 1.0 oz/week    2 drink(s) per week  . Drug Use: No  . Sexually Active: Yes   Other Topics Concern  . Not on file   Social History Narrative  . No narrative on file     Review of Systems General:  No chills, fever, night sweats   Cardiovascular:  No chest pain, dyspnea on exertion, edema, orthopnea, palpitations, paroxysmal nocturnal dyspnea. Dermatological: No rash, lesions/masses Respiratory: Positive cough, dyspnea Urologic: No hematuria, dysuria Abdominal:   No nausea, vomiting, diarrhea, bright red blood per rectum, melena, or hematemesis Neurologic:  No visual changes, wkns, changes in mental status. All other systems reviewed and are otherwise negative except as noted above.  Physical Exam  Blood pressure 160/106, pulse 92, temperature 98.9 F (37.2 C), resp. rate 20, SpO2 96.00%.  General: Pleasant, NAD frequent dry cough during exam Psych: Normal affect. Neuro: Alert and oriented X 3. Moves all extremities spontaneously. HEENT: Normal  Neck: Supple without bruits or JVD. Lungs:  Resp regular and unlabored, CTA. Heart: Irregular rate and rhythm, no s3, s4, systolic murmur at the apex Abdomen: Soft, non-tender, non-distended, BS + x 4.  Extremities: No clubbing, cyanosis , trace pretibial edema DP/PT/Radials 2+ and equal bilaterally.  Labs  No results found for this basename: CKTOTAL, CKMB, TROPONINI,  in the last 72 hours Lab Results  Component Value Date   WBC 14.3* 07/20/2013   HGB 13.1 07/20/2013   HCT 39.8 07/20/2013   MCV 97.3 07/20/2013   PLT 325 07/20/2013    Recent Labs Lab 07/20/13 1010  NA 140  K 4.1  CL 105  CO2 22  BUN 30*  CREATININE 1.68*  CALCIUM 9.7  PROT 6.9  BILITOT 0.8  ALKPHOS 100  ALT 41  AST 32  GLUCOSE 101*   No results found for this  basename: CHOL, HDL, LDLCALC, TRIG   No results found for this basename: DDIMER     Radiology/Studies  Dg Chest 2 View  07/09/2013   *RADIOLOGY REPORT*  Clinical Data: Follow up congestive heart failure.  CHEST - 2 VIEW  Comparison: 07/05/2013  Findings: Pulmonary edema has resolved.  There are minimal bilateral pleural effusions.  The lungs are clear.  The cardiac silhouette is mildly enlarged.  No mediastinal or hilar masses.  IMPRESSION: Resolved pulmonary edema.  Minimal bilateral pleural effusions.   Original Report Authenticated By: Amie Portland, M.D.    ECG    ASSESSMENT AND PLAN  Acute on chronic combined congestive heart failure. We'll give 40 mg of IV Lasix  now and reassess in the morning. Continue other medications. Has stopped the steroids. Surgery per Dr. Cornelius Moras.   Signed, Valera Castle, MD 07/20/2013, @[facility]

## 2013-07-20 NOTE — Progress Notes (Signed)
07/20/13 1233  OBSTRUCTIVE SLEEP APNEA  Have you ever been diagnosed with sleep apnea through a sleep study? No  Do you snore loudly (loud enough to be heard through closed doors)?  1  Do you often feel tired, fatigued, or sleepy during the daytime? 1  Has anyone observed you stop breathing during your sleep? 1  Do you have, or are you being treated for high blood pressure? 1  BMI more than 35 kg/m2? 0  Age over 68 years old? 1  Neck circumference greater than 40 cm/18 inches? 0  Obstructive Sleep Apnea Score 5

## 2013-07-20 NOTE — Patient Instructions (Signed)
Go directly to hospital admission

## 2013-07-20 NOTE — Pre-Procedure Instructions (Signed)
Henry Page  07/20/2013   Your procedure is scheduled on:  07/22/2013  Report to [facility] at 6:30 AM.  Call this number if you have problems the morning of surgery: [phone]   Remember:   Do not eat food or drink liquids after midnight.  on Tuesday    Take these medicines the morning of surgery with A SIP OF WATER: amiodarone, metoprolol   Do not wear jewelry  Do not wear lotions, powders, or fragrance . You may wear deodorant.             Men may shave face and neck.  Do not bring valuables to the hospital.  [facility] is not responsible                   for any belongings or valuables.  Contacts, dentures or bridgework may not be worn into surgery.  Leave suitcase in the car. After surgery it may be brought to your room.  For patients admitted to the hospital, checkout time is 11:00 AM the day of  discharge.   Patients discharged the day of surgery will not be allowed to drive  home.  Name and phone number of your driver: /w family  Special Instructions: Shower using CHG 2 nights before surgery and the night before surgery.  If you shower the day of surgery use CHG.  Use special wash - you have one bottle of CHG for all showers.  You should use approximately 1/3 of the bottle for each shower.   Please read over the following fact sheets that you were given: Pain Booklet, Coughing and Deep Breathing, Blood Transfusion Information, Open Heart Packet, MRSA Information and Surgical Site Infection Prevention

## 2013-07-20 NOTE — Progress Notes (Signed)
Call to [facility] at [facility], requested by voicemail to send cath Report & stress test if its available.

## 2013-07-20 NOTE — Progress Notes (Signed)
[address]       [name] [address]             [phone]     CARDIOTHORACIC SURGERY OFFICE NOTE  Referring Provider is Pricilla Riffle, MD PCP is Ignatius Specking., MD   HPI:  Patient returns for followup of severe symptomatic mitral regurgitation, coronary artery disease and atrial fibrillation.  He was originally seen during his hospitalization earlier this month for acute exacerbation of chronic combined systolic and diastolic congestive heart failure with atrial fibrillation. He was discharged from the hospital 10 days ago with tentative plans to return for elective mitral valve repair, coronary artery bypass grafting, and Maze procedure later this week. The patient states that when he first left the hospital he felt quite well and was actually able to ambulate a full mile without much difficulty. However, over the next several days he developed gradual progression of exertional shortness of breath. By the end of last week he could not lie flat in bed any noted to have a persistent cough which she blamed on sinus drainage. He was seen by his primary care physician this past Friday, given a shot of steroids, started on an oral prednisone taper, and a prescription for amoxicillin.  He has never had any fevers or chills. He has not had any lower extremity edema. He has not had any tachypalpitations or dizzy spells. He has remained somewhat anxious.  Of note, when he was discharged from the hospital 10 days ago he was not given a prescription for any diuretics.   Current Outpatient Prescriptions  Medication Sig Dispense Refill  . acetaminophen (TYLENOL) 325 MG tablet Take 2 tablets (650 mg total) by mouth every 4 (four) hours as needed.      . ALPRAZolam (XANAX) 0.5 MG tablet Take 0.5 mg by mouth 2 (two) times daily as needed for sleep.      Marland Kitchen amiodarone (PACERONE) 200 MG tablet Take 2 tablets (400 mg total) by mouth 2 (two) times daily after a meal.  120 tablet  11  .  amoxicillin (AMOXIL) 500 MG capsule Take 500 mg by mouth 3 (three) times daily.      Marland Kitchen aspirin EC 325 MG EC tablet Take 1 tablet (325 mg total) by mouth daily.      Marland Kitchen atorvastatin (LIPITOR) 40 MG tablet Take 1 tablet (40 mg total) by mouth daily at 6 PM.  30 tablet  11  . azelastine (ASTELIN) 137 MCG/SPRAY nasal spray Place 1 spray into the nose daily as needed for rhinitis. Use in each nostril as directed      . desoximetasone (TOPICORT) 0.25 % cream Apply 1 application topically daily as needed.      Marland Kitchen DM-APAP-CPM (CORICIDIN HBP FLU PO) Take 1 tablet by mouth daily as needed (for sinus infection).      . metoprolol tartrate (LOPRESSOR) 25 MG tablet Take 1 tablet (25 mg total) by mouth 2 (two) times daily.  60 tablet  11  . nitroGLYCERIN (NITROSTAT) 0.4 MG SL tablet Place 1 tablet (0.4 mg total) under the tongue every 5 (five) minutes x 3 doses as needed for chest pain.  25 tablet  12   No current facility-administered medications for this visit.      Physical Exam:   BP 145/103  Pulse 103  Resp 20  Ht 5\' 10"  (1.778 m)  Wt 178 lb (80.74 kg)  BMI 25.54 kg/m2  SpO2 95%  General:  Somewhat anxious but otherwise well-appearing  Chest:   Few bibasilar inspiratory crackles  CV:   Irregular rate and rhythm with systolic murmur  Incisions:  n/a  Abdomen:  Soft and nontender  Extremities:  Warm and well-perfused  Diagnostic Tests:  Blood work obtained earlier today is notable for elevated white blood count at 14,000.  Chest x-ray has not yet been done. Creatinine is mildly elevated at 1.6.   Impression:  I am concerned that the patient's worsening shortness of breath and cough are likely related to congestive heart failure rather than infection. White blood count is now elevated, but this may well be explained by the administration of steroids.   Plan:  I favor admitting the patient to the hospital to treat the patient for presumed acute exacerbation of chronic congestive heart  failure and further sort out whether or not it makes sense to proceed with surgery this week as previously planned.  I discussed this matter over the telephone with Dr. Tenny Craw who plans to admit the patient in the hospital today.   Salvatore Decent. Cornelius Moras, MD 07/20/2013 12:21 PM

## 2013-07-21 ENCOUNTER — Encounter (HOSPITAL_COMMUNITY): Payer: Self-pay | Admitting: Nurse Practitioner

## 2013-07-21 DIAGNOSIS — I5041 Acute combined systolic (congestive) and diastolic (congestive) heart failure: Secondary | ICD-10-CM

## 2013-07-21 DIAGNOSIS — I509 Heart failure, unspecified: Secondary | ICD-10-CM

## 2013-07-21 DIAGNOSIS — I2510 Atherosclerotic heart disease of native coronary artery without angina pectoris: Secondary | ICD-10-CM

## 2013-07-21 DIAGNOSIS — I059 Rheumatic mitral valve disease, unspecified: Secondary | ICD-10-CM

## 2013-07-21 DIAGNOSIS — I4891 Unspecified atrial fibrillation: Secondary | ICD-10-CM

## 2013-07-21 DIAGNOSIS — N183 Chronic kidney disease, stage 3 unspecified: Secondary | ICD-10-CM | POA: Diagnosis present

## 2013-07-21 LAB — BASIC METABOLIC PANEL
CO2: 23 mEq/L (ref 19–32)
Calcium: 9.1 mg/dL (ref 8.4–10.5)
Chloride: 102 mEq/L (ref 96–112)
Glucose, Bld: 113 mg/dL — ABNORMAL HIGH (ref 70–99)
Potassium: 4.2 mEq/L (ref 3.5–5.1)
Sodium: 138 mEq/L (ref 135–145)

## 2013-07-21 LAB — CBC
HCT: 37.3 % — ABNORMAL LOW (ref 39.0–52.0)
Hemoglobin: 11.9 g/dL — ABNORMAL LOW (ref 13.0–17.0)
MCH: 30.8 pg (ref 26.0–34.0)
MCV: 96.6 fL (ref 78.0–100.0)
RBC: 3.86 MIL/uL — ABNORMAL LOW (ref 4.22–5.81)
WBC: 10.1 10*3/uL (ref 4.0–10.5)

## 2013-07-21 MED ORDER — POTASSIUM CHLORIDE 2 MEQ/ML IV SOLN
80.0000 meq | INTRAVENOUS | Status: DC
  Filled 2013-07-21: qty 40

## 2013-07-21 MED ORDER — DEXMEDETOMIDINE HCL IN NACL 400 MCG/100ML IV SOLN
0.1000 ug/kg/h | INTRAVENOUS | Status: AC
  Administered 2013-07-22: 0.2 ug/kg/h via INTRAVENOUS
  Filled 2013-07-21: qty 100

## 2013-07-21 MED ORDER — SODIUM CHLORIDE 0.9 % IV SOLN
INTRAVENOUS | Status: AC
Start: 2013-07-22 — End: 2013-07-22
  Administered 2013-07-22: 1.1 [IU]/h via INTRAVENOUS
  Filled 2013-07-21: qty 1

## 2013-07-21 MED ORDER — CHLORHEXIDINE GLUCONATE 4 % EX LIQD
60.0000 mL | Freq: Once | CUTANEOUS | Status: AC
  Administered 2013-07-22: 4 via TOPICAL
  Filled 2013-07-21: qty 60

## 2013-07-21 MED ORDER — DEXTROSE 5 % IV SOLN
1.5000 g | INTRAVENOUS | Status: AC
  Administered 2013-07-22: .75 g via INTRAVENOUS
  Administered 2013-07-22: 1.5 g via INTRAVENOUS
  Filled 2013-07-21: qty 1.5

## 2013-07-21 MED ORDER — VANCOMYCIN HCL 10 G IV SOLR
1250.0000 mg | INTRAVENOUS | Status: AC
  Administered 2013-07-22: 1250 mg via INTRAVENOUS
  Filled 2013-07-21: qty 1250

## 2013-07-21 MED ORDER — TEMAZEPAM 15 MG PO CAPS: 15.0000 mg | ORAL_CAPSULE | Freq: Once | ORAL | Status: AC | PRN

## 2013-07-21 MED ORDER — PLASMA-LYTE 148 IV SOLN
INTRAVENOUS | Status: AC
  Administered 2013-07-22: 10:00:00
  Filled 2013-07-21: qty 2.5

## 2013-07-21 MED ORDER — MAGNESIUM SULFATE 50 % IJ SOLN
40.0000 meq | INTRAMUSCULAR | Status: DC
  Filled 2013-07-21: qty 10

## 2013-07-21 MED ORDER — DEXTROSE 5 % IV SOLN
750.0000 mg | INTRAVENOUS | Status: DC
  Filled 2013-07-21: qty 750

## 2013-07-21 MED ORDER — DOPAMINE-DEXTROSE 3.2-5 MG/ML-% IV SOLN
2.0000 ug/kg/min | INTRAVENOUS | Status: AC
  Administered 2013-07-22: 3 ug/kg/min via INTRAVENOUS
  Filled 2013-07-21: qty 250

## 2013-07-21 MED ORDER — BISACODYL 5 MG PO TBEC
5.0000 mg | DELAYED_RELEASE_TABLET | Freq: Once | ORAL | Status: DC
  Filled 2013-07-21: qty 1

## 2013-07-21 MED ORDER — METOPROLOL TARTRATE 12.5 MG HALF TABLET
12.5000 mg | ORAL_TABLET | Freq: Once | ORAL | Status: DC
  Filled 2013-07-21: qty 1

## 2013-07-21 MED ORDER — SODIUM CHLORIDE 0.9 % IV SOLN
INTRAVENOUS | Status: AC
  Administered 2013-07-22: 69.8 mL/h via INTRAVENOUS
  Filled 2013-07-21: qty 40

## 2013-07-21 MED ORDER — METOPROLOL TARTRATE 12.5 MG HALF TABLET
12.5000 mg | ORAL_TABLET | Freq: Once | ORAL | Status: AC
  Administered 2013-07-22: 12.5 mg via ORAL
  Filled 2013-07-21: qty 1

## 2013-07-21 MED ORDER — FUROSEMIDE 10 MG/ML IJ SOLN
40.0000 mg | Freq: Once | INTRAMUSCULAR | Status: AC
  Administered 2013-07-21: 40 mg via INTRAVENOUS
  Filled 2013-07-21: qty 4

## 2013-07-21 MED ORDER — NITROGLYCERIN IN D5W 200-5 MCG/ML-% IV SOLN
2.0000 ug/min | INTRAVENOUS | Status: AC
  Administered 2013-07-22: 5 ug/min via INTRAVENOUS
  Filled 2013-07-21: qty 250

## 2013-07-21 MED ORDER — EPINEPHRINE HCL 1 MG/ML IJ SOLN
0.5000 ug/min | INTRAVENOUS | Status: DC
  Filled 2013-07-21: qty 4

## 2013-07-21 MED ORDER — VANCOMYCIN HCL 1000 MG IV SOLR
INTRAVENOUS | Status: AC
  Administered 2013-07-22: 10:00:00
  Filled 2013-07-21: qty 1000

## 2013-07-21 MED ORDER — CHLORHEXIDINE GLUCONATE 4 % EX LIQD
60.0000 mL | Freq: Once | CUTANEOUS | Status: AC
  Administered 2013-07-21: 4 via TOPICAL
  Filled 2013-07-21: qty 60

## 2013-07-21 MED ORDER — PHENYLEPHRINE HCL 10 MG/ML IJ SOLN
30.0000 ug/min | INTRAVENOUS | Status: AC
  Administered 2013-07-22: 20 ug/min via INTRAVENOUS
  Filled 2013-07-21: qty 2

## 2013-07-21 MED ORDER — SODIUM CHLORIDE 0.9 % IV SOLN
INTRAVENOUS | Status: DC
  Filled 2013-07-21: qty 30

## 2013-07-21 NOTE — Progress Notes (Signed)
      [address]       [place] [address]             [phone]     CARDIOTHORACIC SURGERY PROGRESS NOTE  Subjective: Feels some better.  Still w/ dry, non-productive cough but breathing improved.  Anxious.  Objective: Vital signs in last 24 hours: Temp:  [97.7 F (36.5 C)-98.9 F (37.2 C)] 98.3 F (36.8 C) (07/29 0901) Pulse Rate:  [52-98] 98 (07/29 0901) Cardiac Rhythm:  [-] Atrial fibrillation (07/29 0901) Resp:  [18-20] 18 (07/29 0527) BP: (137-160)/(92-106) 138/92 mmHg (07/29 0901) SpO2:  [94 %-96 %] 94 % (07/29 0901) Weight:  [78.971 kg (174 lb 1.6 oz)] 78.971 kg (174 lb 1.6 oz) (07/29 0527)  Physical Exam:  Rhythm:   Afib  Breath sounds: Fairly clear  Heart sounds:  irreg w/ systolic murmur  Incisions:  n/a  Abdomen:  soft  Extremities:  warm   Intake/Output from previous day: 07/28 0701 - 07/29 0700 In: -  Out: 2950 [Urine:2950] Intake/Output this shift: Total I/O In: 360 [P.O.:360] Out: -   Lab Results:  Recent Labs  07/20/13 1555 07/21/13 0821  WBC 12.1* 10.1  HGB 13.0 11.9*  HCT 38.7* 37.3*  PLT 303 267   BMET:  Recent Labs  07/20/13 1555 07/21/13 0821  NA 140 138  K 3.9 4.2  CL 103 102  CO2 21 23  GLUCOSE 113* 113*  BUN 33* 34*  CREATININE 1.71* 1.72*  CALCIUM 9.3 9.1    CBG (last 3)  No results found for this basename: GLUCAP,  in the last 72 hours PT/INR:   Recent Labs  07/20/13 1010  LABPROT 14.7  INR 1.17   Results for Henry Page, Henry Page (MRN [id number]) as of 07/21/2013 13:08  Ref. Range 07/20/2013 10:10  Pro B Natriuretic peptide (BNP) Latest Range: 0-125 pg/mL 6120.0 [name]    CXR:  *RADIOLOGY REPORT*  Clinical Data: CHF versus pneumonia.  CHEST - 2 VIEW  Comparison: None.  Findings: Small bilateral pleural effusions, right slightly greater  than left. No focal airspace opacities or evidence of edema.  Heart is borderline in size. Mild peribronchial thickening. No  acute bony abnormality.    IMPRESSION:  Small bilateral pleural effusions. Borderline cardiomegaly and  bronchitic changes.  Original Report Authenticated By: Charlett Nose, M.D.    Assessment/Plan: S/P Procedure(s) (LRB): MITRAL VALVE REPAIR (MVR) (N/A) CORONARY ARTERY BYPASS GRAFTING (CABG) (N/A) MAZE (N/A) INTRAOPERATIVE TRANSESOPHAGEAL ECHOCARDIOGRAM (N/A)  Henry Page has recurrent acute on chronic combined systolic and diastolic congestive heart failure.  He has improved overnight with IV diuresis, but creatinine is up slightly and I am concerned that postponing surgery to facilitate further diuresis might only exacerbate his renal dysfunction.  His leukocytosis has resolved now that he is off of steroids and there's never been any clear sign of infection.  I favor proceeding with surgery tomorrow as originally planned.  I have reviewed the indications, risks and potential benefits of surgery with Henry Page and his wife.  They understand and accept all potential associated risks of surgery including but not limited to risk of death, stroke, myocardial infarction, congestive heart failure, respiratory failure, renal failure, bleeding requiring blood transfusion and/or reexploration, arrhythmia, heart block or bradycardia requiring permanent pacemaker, pneumonia, pleural effusion, wound infection, pulmonary embolus or other thromboembolic complication, chronic pain or other delayed complications.  All questions answered.   Henry Page,Henry Page 07/21/2013 1:07 PM

## 2013-07-21 NOTE — Progress Notes (Signed)
Patient Name: Henry Page Date of Encounter: 07/21/2013    Principal Problem:   Acute combined systolic and diastolic congestive heart failure Active Problems:   Mitral regurgitation   CAD (coronary artery disease)   CKD (chronic kidney disease), stage III   Atrial fibrillation   Essential hypertension, benign   SUBJECTIVE  No chest pain or significant sob.  He still has some nasal congestion with what he believes to be post-nasal drip.  He denies chest pain, palpitations, dyspnea, pnd, orthopnea, n, v, dizziness, syncope, edema, weight gain, or early satiety.  CURRENT MEDS . amiodarone  400 mg Oral BID PC  . amoxicillin  500 mg Oral TID  . aspirin EC  81 mg Oral Daily  . atorvastatin  40 mg Oral q1800  . heparin  5,000 Units Subcutaneous Q8H  . metoprolol tartrate  25 mg Oral BID  . sodium chloride  3 mL Intravenous Q12H   OBJECTIVE  Filed Vitals:   07/20/13 1351 07/20/13 2038 07/21/13 0527 07/21/13 0901  BP: 160/106 155/101 137/94 138/92  Pulse: 92 68 52 98  Temp: 98.9 F (37.2 C) 97.7 F (36.5 C) 98.2 F (36.8 C) 98.3 F (36.8 C)  TempSrc:  Oral Oral Oral  Resp: 20 20 18    Weight:   174 lb 1.6 oz (78.971 kg)   SpO2: 96% 94% 95% 94%    Intake/Output Summary (Last 24 hours) at 07/21/13 0943 Last data filed at 07/21/13 0553  Gross per 24 hour  Intake      0 ml  Output   2950 ml  Net  -2950 ml   Filed Weights   07/21/13 0527  Weight: 174 lb 1.6 oz (78.971 kg)   PHYSICAL EXAM  General: Pleasant, NAD. Neuro: Alert and oriented X 3. Moves all extremities spontaneously. Psych: Normal affect. HEENT:  Normal  Neck: Supple, difficult to assess jvp 2/2 girth.  bilat soft radiated murmur vs bruits. Lungs:  Resp regular and unlabored, CTA. Heart: RRR no s3, s4, 3/6 syst murmur loudest @ apex but heard throughout precordium and left back. Abdomen: Soft, protuberant, somewhat firm, non-tender, BS + x 4.  Extremities: No clubbing, cyanosis or edema.  DP/PT/Radials 2+ and equal bilaterally.  Accessory Clinical Findings  CBC  Recent Labs  07/20/13 1010 07/20/13 1555 07/21/13 0821  WBC 14.3* 12.1* 10.1  NEUTROABS  --  9.1*  --   HGB 13.1 13.0 11.9*  HCT 39.8 38.7* 37.3*  MCV 97.3 97.0 96.6  PLT 325 303 267   Basic Metabolic Panel  Recent Labs  07/20/13 1555 07/21/13 0821  NA 140 138  K 3.9 4.2  CL 103 102  CO2 21 23  GLUCOSE 113* 113*  BUN 33* 34*  CREATININE 1.71* 1.72*  CALCIUM 9.3 9.1   Liver Function Tests  Recent Labs  07/20/13 1010 07/20/13 1555  AST 32 28  ALT 41 38  ALKPHOS 100 100  BILITOT 0.8 0.8  PROT 6.9 6.7  ALBUMIN 3.7 3.6   Hemoglobin A1C  Recent Labs  07/20/13 1010  HGBA1C 5.2   pBNP 6120  TELE  Afib, rate controlled.  ECG  Afib, 89, poor r prog, lvh - no acute st/t changes.  Radiology/Studies  Dg Chest 2 View  07/20/2013   *RADIOLOGY REPORT*  Clinical Data: CHF versus pneumonia.  CHEST - 2 VIEW  Comparison: None.  Findings: Small bilateral pleural effusions, right slightly greater than left.  No focal airspace opacities or evidence of edema. Heart is borderline  in size.  Mild peribronchial thickening.  No acute bony abnormality.  IMPRESSION: Small bilateral pleural effusions.  Borderline cardiomegaly and bronchitic changes.   Original Report Authenticated By: Charlett Nose, M.D.   ASSESSMENT AND PLAN  1.  Acute on chronic diastolic CHF:  In setting of severe MR.  He has been more dyspneic with cough and congestion at home though he denies pnd, orthopnea, edema, or early satiety.  His weight however is ~ 5-6 lbs above his previously documented low of 168 on 7/15.  He was 177 yesterday and is 174 today.  He received 1 dose of IV lasix last night with good response.  His BUN/Creat, which were relatively nl on 7/17, are elevated @ 34/1.72 with nl bicarb.  He has no crackles on exam and neck veins are difficult to assess 2/2 girth however his abdomen is protuberant/semi-firm.  Will give  another dose of IV lasix today and f/u renal fxn in AM.  Cont current dose of bb.  BP has been slightly elevated.  Follow with further diuresis.  2.  Sev MR:  Pending MV repair and cabg tomorrow.  3.  CAD:  No chest pain.  4.  Afib:  Reasonably rate-controlled.  Cont BB.  CHA2DS2VASc= 5.  Not currently on oral anticoagulation 2/2 pending thoracic surgery.  Will look to add post-op.  5.  CKD III:  Creat up sl this AM.  Follow with diuresis.  6.  Bronchitis:  Afebrile.  Feels mostly better.  WBC trending down (off steroids).  Cont amoxicillin.  Signed, Nicolasa Ducking NP Patient seen and examined and history reviewed. Agree with above findings and plan. Good response to IV lasix yesterday. Weight is trending down. Renal function is stable. Will give another dose of lasix today and monitor. Otherwise stable for surgery- planned for tomorrow.  Theron Arista [facility] 07/21/2013 10:45 AM

## 2013-07-22 ENCOUNTER — Inpatient Hospital Stay (HOSPITAL_COMMUNITY): Payer: BC Managed Care – PPO | Admitting: Anesthesiology

## 2013-07-22 ENCOUNTER — Inpatient Hospital Stay (HOSPITAL_COMMUNITY): Payer: BC Managed Care – PPO

## 2013-07-22 ENCOUNTER — Encounter (HOSPITAL_COMMUNITY): Payer: Self-pay | Admitting: Certified Registered"

## 2013-07-22 ENCOUNTER — Encounter (HOSPITAL_COMMUNITY): Payer: Self-pay | Admitting: Anesthesiology

## 2013-07-22 ENCOUNTER — Encounter (HOSPITAL_COMMUNITY)
Admission: RE | Disposition: A | Discharge status: Home / Self Care | Payer: Self-pay | Source: Ambulatory Visit | Attending: Thoracic Surgery (Cardiothoracic Vascular Surgery)

## 2013-07-22 DIAGNOSIS — Z951 Presence of aortocoronary bypass graft: Secondary | ICD-10-CM

## 2013-07-22 DIAGNOSIS — I4891 Unspecified atrial fibrillation: Secondary | ICD-10-CM

## 2013-07-22 DIAGNOSIS — Q2111 Secundum atrial septal defect: Secondary | ICD-10-CM

## 2013-07-22 DIAGNOSIS — I2510 Atherosclerotic heart disease of native coronary artery without angina pectoris: Secondary | ICD-10-CM

## 2013-07-22 DIAGNOSIS — Z8679 Personal history of other diseases of the circulatory system: Secondary | ICD-10-CM

## 2013-07-22 DIAGNOSIS — Q211 Atrial septal defect: Secondary | ICD-10-CM

## 2013-07-22 DIAGNOSIS — Z9889 Other specified postprocedural states: Secondary | ICD-10-CM

## 2013-07-22 DIAGNOSIS — I059 Rheumatic mitral valve disease, unspecified: Secondary | ICD-10-CM

## 2013-07-22 HISTORY — DX: Personal history of other diseases of the circulatory system: Z86.79

## 2013-07-22 HISTORY — PX: MAZE: SHX5063

## 2013-07-22 HISTORY — PX: MITRAL VALVE REPAIR: SHX2039

## 2013-07-22 HISTORY — DX: Presence of aortocoronary bypass graft: Z95.1

## 2013-07-22 HISTORY — PX: CORONARY ARTERY BYPASS GRAFT: SHX141

## 2013-07-22 HISTORY — DX: Other specified postprocedural states: Z98.890

## 2013-07-22 HISTORY — PX: PATENT FORAMEN OVALE CLOSURE: SHX5483

## 2013-07-22 HISTORY — PX: INTRAOPERATIVE TRANSESOPHAGEAL ECHOCARDIOGRAM: SHX5062

## 2013-07-22 LAB — POCT I-STAT 3, ART BLOOD GAS (G3+)
Acid-base deficit: 4 mmol/L — ABNORMAL HIGH (ref 0.0–2.0)
Acid-base deficit: 2 mmol/L (ref 0.0–2.0)
Bicarbonate: 22.8 mEq/L (ref 20.0–24.0)
Bicarbonate: 22.1 mEq/L (ref 20.0–24.0)
O2 Saturation: 100 %
O2 Saturation: 90 %
O2 Saturation: 91 %
Patient temperature: 36.6
Patient temperature: 36.3
Patient temperature: 36.4
TCO2: 24 mmol/L (ref 0–100)
TCO2: 24 mmol/L (ref 0–100)
TCO2: 23 mmol/L (ref 0–100)
TCO2: 24 mmol/L (ref 0–100)
pCO2 arterial: 44 mmHg (ref 35.0–45.0)
pCO2 arterial: 44.6 mmHg (ref 35.0–45.0)
pCO2 arterial: 50.4 mmHg — ABNORMAL HIGH (ref 35.0–45.0)
pCO2 arterial: 40 mmHg (ref 35.0–45.0)
pH, Arterial: 7.366 (ref 7.350–7.450)
pH, Arterial: 7.317 — ABNORMAL LOW (ref 7.350–7.450)
pH, Arterial: 7.248 — ABNORMAL LOW (ref 7.350–7.450)
pO2, Arterial: 116 mmHg — ABNORMAL HIGH (ref 80.0–100.0)

## 2013-07-22 LAB — PROTIME-INR: INR: 1.59 — ABNORMAL HIGH (ref 0.00–1.49)

## 2013-07-22 LAB — POCT I-STAT 4, (NA,K, GLUC, HGB,HCT)
Glucose, Bld: 98 mg/dL (ref 70–99)
Glucose, Bld: 96 mg/dL (ref 70–99)
Glucose, Bld: 97 mg/dL (ref 70–99)
Glucose, Bld: 96 mg/dL (ref 70–99)
HCT: 37 % — ABNORMAL LOW (ref 39.0–52.0)
HCT: 34 % — ABNORMAL LOW (ref 39.0–52.0)
HCT: 26 % — ABNORMAL LOW (ref 39.0–52.0)
Hemoglobin: 8.8 g/dL — ABNORMAL LOW (ref 13.0–17.0)
Hemoglobin: 8.8 g/dL — ABNORMAL LOW (ref 13.0–17.0)
Hemoglobin: 12.2 g/dL — ABNORMAL LOW (ref 13.0–17.0)
Potassium: 4 mEq/L (ref 3.5–5.1)
Potassium: 5 mEq/L (ref 3.5–5.1)
Potassium: 5.4 mEq/L — ABNORMAL HIGH (ref 3.5–5.1)
Potassium: 5.6 mEq/L — ABNORMAL HIGH (ref 3.5–5.1)
Potassium: 4.5 mEq/L (ref 3.5–5.1)
Potassium: 4.4 mEq/L (ref 3.5–5.1)
Sodium: 138 mEq/L (ref 135–145)
Sodium: 139 mEq/L (ref 135–145)
Sodium: 142 mEq/L (ref 135–145)

## 2013-07-22 LAB — BASIC METABOLIC PANEL
BUN: 36 mg/dL — ABNORMAL HIGH (ref 6–23)
Calcium: 9.2 mg/dL (ref 8.4–10.5)
Creatinine, Ser: 1.77 mg/dL — ABNORMAL HIGH (ref 0.50–1.35)
GFR calc non Af Amer: 38 mL/min — ABNORMAL LOW (ref >90)
Glucose, Bld: 86 mg/dL (ref 70–99)
Sodium: 138 mEq/L (ref 135–145)

## 2013-07-22 LAB — CBC
HCT: 36.9 % — ABNORMAL LOW (ref 39.0–52.0)
Hemoglobin: 12 g/dL — ABNORMAL LOW (ref 13.0–17.0)
MCH: 32.1 pg (ref 26.0–34.0)
MCH: 31.4 pg (ref 26.0–34.0)
MCHC: 33.4 g/dL (ref 30.0–36.0)
MCHC: 32.5 g/dL (ref 30.0–36.0)
MCV: 96.6 fL (ref 78.0–100.0)
Platelets: 277 10*3/uL (ref 150–400)
RBC: 4.05 MIL/uL — ABNORMAL LOW (ref 4.22–5.81)
RDW: 15.7 % — ABNORMAL HIGH (ref 11.5–15.5)
RDW: 15.8 % — ABNORMAL HIGH (ref 11.5–15.5)

## 2013-07-22 LAB — POCT I-STAT GLUCOSE: Operator id: 284731

## 2013-07-22 LAB — PLATELET COUNT: Platelets: 129 10*3/uL — ABNORMAL LOW (ref 150–400)

## 2013-07-22 SURGERY — REPAIR, MITRAL VALVE
Anesthesia: General | Site: Chest | Wound class: Clean

## 2013-07-22 MED ORDER — LACTATED RINGERS IV SOLN
INTRAVENOUS | Status: DC | PRN
  Administered 2013-07-22 (×5): via INTRAVENOUS

## 2013-07-22 MED ORDER — PROTAMINE SULFATE 10 MG/ML IV SOLN
INTRAVENOUS | Status: DC | PRN
  Administered 2013-07-22: 50 mg via INTRAVENOUS
  Administered 2013-07-22 (×2): 100 mg via INTRAVENOUS
  Administered 2013-07-22: 50 mg via INTRAVENOUS

## 2013-07-22 MED ORDER — MORPHINE SULFATE 2 MG/ML IJ SOLN
2.0000 mg | INTRAMUSCULAR | Status: DC | PRN
  Administered 2013-07-23 (×4): 2 mg via INTRAVENOUS
  Filled 2013-07-22 (×5): qty 1

## 2013-07-22 MED ORDER — LACTATED RINGERS IV SOLN
INTRAVENOUS | Status: DC
  Administered 2013-07-22: 20 mL/h via INTRAVENOUS

## 2013-07-22 MED ORDER — HEMOSTATIC AGENTS (NO CHARGE) OPTIME
TOPICAL | Status: DC | PRN
  Administered 2013-07-22: 1 via TOPICAL

## 2013-07-22 MED ORDER — SODIUM CHLORIDE 0.9 % IV SOLN
INTRAVENOUS | Status: DC | PRN
  Administered 2013-07-22: 15:00:00 via INTRAVENOUS

## 2013-07-22 MED ORDER — ASPIRIN EC 325 MG PO TBEC
325.0000 mg | DELAYED_RELEASE_TABLET | Freq: Every day | ORAL | Status: DC
  Administered 2013-07-23: 325 mg via ORAL
  Filled 2013-07-22 (×2): qty 1

## 2013-07-22 MED ORDER — DEXTROSE 5 % IV SOLN
1.5000 g | Freq: Two times a day (BID) | INTRAVENOUS | Status: DC
  Filled 2013-07-22 (×3): qty 1.5

## 2013-07-22 MED ORDER — SODIUM CHLORIDE 0.9 % IJ SOLN: 3.0000 mL | INTRAMUSCULAR | Status: DC | PRN

## 2013-07-22 MED ORDER — MORPHINE SULFATE 2 MG/ML IJ SOLN
1.0000 mg | INTRAMUSCULAR | Status: AC | PRN
  Administered 2013-07-22: 2 mg via INTRAVENOUS

## 2013-07-22 MED ORDER — DEXTROSE 5 % IV SOLN
1.5000 g | Freq: Two times a day (BID) | INTRAVENOUS | Status: AC
  Administered 2013-07-22 – 2013-07-24 (×4): 1.5 g via INTRAVENOUS
  Filled 2013-07-22 (×4): qty 1.5

## 2013-07-22 MED ORDER — ALBUMIN HUMAN 5 % IV SOLN
250.0000 mL | INTRAVENOUS | Status: AC | PRN
  Administered 2013-07-22 – 2013-07-23 (×4): 250 mL via INTRAVENOUS
  Filled 2013-07-22: qty 500
  Filled 2013-07-22: qty 250

## 2013-07-22 MED ORDER — MAGNESIUM SULFATE 40 MG/ML IJ SOLN
4.0000 g | Freq: Once | INTRAMUSCULAR | Status: AC
  Administered 2013-07-22: 4 g via INTRAVENOUS
  Filled 2013-07-22: qty 100

## 2013-07-22 MED ORDER — ALBUMIN HUMAN 5 % IV SOLN
INTRAVENOUS | Status: DC | PRN
  Administered 2013-07-22 (×3): via INTRAVENOUS

## 2013-07-22 MED ORDER — MILRINONE IN DEXTROSE 20 MG/100ML IV SOLN
0.3000 ug/kg/min | INTRAVENOUS | Status: DC
  Administered 2013-07-22 – 2013-07-24 (×4): 0.3 ug/kg/min via INTRAVENOUS
  Filled 2013-07-22 (×3): qty 100

## 2013-07-22 MED ORDER — HEPARIN SODIUM (PORCINE) 1000 UNIT/ML IJ SOLN
INTRAMUSCULAR | Status: DC | PRN
  Administered 2013-07-22: 34000 [IU] via INTRAVENOUS
  Administered 2013-07-22: 3000 [IU] via INTRAVENOUS

## 2013-07-22 MED ORDER — ACETAMINOPHEN 160 MG/5ML PO SOLN
1000.0000 mg | Freq: Four times a day (QID) | ORAL | Status: DC
  Administered 2013-07-23 (×2): 1000 mg
  Filled 2013-07-22 (×2): qty 40.6

## 2013-07-22 MED ORDER — PHENYLEPHRINE HCL 10 MG/ML IJ SOLN
0.0000 ug/min | INTRAMUSCULAR | Status: DC
  Administered 2013-07-22: 50 ug/min via INTRAVENOUS
  Administered 2013-07-23: 25 ug/min via INTRAVENOUS
  Administered 2013-07-23: 55 ug/min via INTRAVENOUS
  Administered 2013-07-24: 25 ug/min via INTRAVENOUS
  Filled 2013-07-22 (×5): qty 2

## 2013-07-22 MED ORDER — VANCOMYCIN HCL IN DEXTROSE 1-5 GM/200ML-% IV SOLN
1000.0000 mg | Freq: Once | INTRAVENOUS | Status: AC
  Administered 2013-07-22: 1000 mg via INTRAVENOUS
  Filled 2013-07-22: qty 200

## 2013-07-22 MED ORDER — METOPROLOL TARTRATE 25 MG/10 ML ORAL SUSPENSION
12.5000 mg | Freq: Two times a day (BID) | ORAL | Status: DC
  Filled 2013-07-22 (×5): qty 5

## 2013-07-22 MED ORDER — BISACODYL 10 MG RE SUPP
10.0000 mg | Freq: Every day | RECTAL | Status: DC
Start: 2013-07-23 — End: 2013-07-31
  Administered 2013-07-28: 10 mg via RECTAL
  Filled 2013-07-22: qty 1

## 2013-07-22 MED ORDER — DEXMEDETOMIDINE HCL IN NACL 200 MCG/50ML IV SOLN
0.1000 ug/kg/h | INTRAVENOUS | Status: DC
  Administered 2013-07-22 – 2013-07-23 (×4): 0.7 ug/kg/h via INTRAVENOUS
  Filled 2013-07-22: qty 50
  Filled 2013-07-22: qty 100
  Filled 2013-07-22: qty 50

## 2013-07-22 MED ORDER — ARTIFICIAL TEARS OP OINT
TOPICAL_OINTMENT | OPHTHALMIC | Status: DC | PRN
  Administered 2013-07-22: 1 via OPHTHALMIC

## 2013-07-22 MED ORDER — POTASSIUM CHLORIDE 10 MEQ/50ML IV SOLN: 10.0000 meq | INTRAVENOUS | Status: AC

## 2013-07-22 MED ORDER — ACETAMINOPHEN 500 MG PO TABS
1000.0000 mg | ORAL_TABLET | Freq: Four times a day (QID) | ORAL | Status: AC
  Administered 2013-07-23 – 2013-07-27 (×13): 1000 mg via ORAL
  Filled 2013-07-22 (×18): qty 2

## 2013-07-22 MED ORDER — PROPOFOL 10 MG/ML IV BOLUS
INTRAVENOUS | Status: DC | PRN
  Administered 2013-07-22: 50 mg via INTRAVENOUS

## 2013-07-22 MED ORDER — ASPIRIN 81 MG PO CHEW: 324.0000 mg | CHEWABLE_TABLET | Freq: Every day | ORAL | Status: DC

## 2013-07-22 MED ORDER — SODIUM CHLORIDE 0.9 % IV SOLN
1.0000 g/h | Freq: Once | INTRAVENOUS | Status: DC
  Filled 2013-07-22: qty 20

## 2013-07-22 MED ORDER — ACETAMINOPHEN 160 MG/5ML PO SOLN
650.0000 mg | Freq: Once | ORAL | Status: AC
  Administered 2013-07-22: 650 mg
  Filled 2013-07-22: qty 20.3

## 2013-07-22 MED ORDER — ONDANSETRON HCL 4 MG/2ML IJ SOLN
4.0000 mg | Freq: Four times a day (QID) | INTRAMUSCULAR | Status: DC | PRN
  Administered 2013-07-23 – 2013-07-25 (×2): 4 mg via INTRAVENOUS
  Filled 2013-07-22 (×2): qty 2

## 2013-07-22 MED ORDER — MILRINONE IN DEXTROSE 20 MG/100ML IV SOLN
INTRAVENOUS | Status: DC | PRN
  Administered 2013-07-22: .3 ug/kg/min via INTRAVENOUS

## 2013-07-22 MED ORDER — DOCUSATE SODIUM 100 MG PO CAPS
200.0000 mg | ORAL_CAPSULE | Freq: Every day | ORAL | Status: DC
  Administered 2013-07-23 – 2013-07-31 (×8): 200 mg via ORAL
  Filled 2013-07-22 (×3): qty 2
  Filled 2013-07-22: qty 1
  Filled 2013-07-22 (×5): qty 2

## 2013-07-22 MED ORDER — EPHEDRINE SULFATE 50 MG/ML IJ SOLN
INTRAMUSCULAR | Status: DC | PRN
  Administered 2013-07-22: 15 mg via INTRAVENOUS

## 2013-07-22 MED ORDER — NITROGLYCERIN IN D5W 200-5 MCG/ML-% IV SOLN: 0.0000 ug/min | INTRAVENOUS | Status: DC

## 2013-07-22 MED ORDER — DOPAMINE-DEXTROSE 3.2-5 MG/ML-% IV SOLN
0.0000 ug/kg/min | INTRAVENOUS | Status: DC
  Administered 2013-07-22: 3 ug/kg/min via INTRAVENOUS

## 2013-07-22 MED ORDER — FENTANYL CITRATE 0.05 MG/ML IJ SOLN
INTRAMUSCULAR | Status: DC | PRN
  Administered 2013-07-22: 100 ug via INTRAVENOUS
  Administered 2013-07-22: 50 ug via INTRAVENOUS
  Administered 2013-07-22: 100 ug via INTRAVENOUS
  Administered 2013-07-22: 250 ug via INTRAVENOUS
  Administered 2013-07-22: 50 ug via INTRAVENOUS
  Administered 2013-07-22: 900 ug via INTRAVENOUS
  Administered 2013-07-22: 50 ug via INTRAVENOUS

## 2013-07-22 MED ORDER — OXYCODONE HCL 5 MG PO TABS
5.0000 mg | ORAL_TABLET | ORAL | Status: DC | PRN
  Administered 2013-07-23 (×2): 5 mg via ORAL
  Administered 2013-07-23 (×2): 10 mg via ORAL
  Filled 2013-07-22: qty 1
  Filled 2013-07-22 (×2): qty 2
  Filled 2013-07-22: qty 1

## 2013-07-22 MED ORDER — PANTOPRAZOLE SODIUM 40 MG PO TBEC
40.0000 mg | DELAYED_RELEASE_TABLET | Freq: Every day | ORAL | Status: DC
  Administered 2013-07-24 – 2013-07-31 (×8): 40 mg via ORAL
  Filled 2013-07-22 (×8): qty 1

## 2013-07-22 MED ORDER — MILRINONE IN DEXTROSE 20 MG/100ML IV SOLN
0.1250 ug/kg/min | INTRAVENOUS | Status: DC
  Filled 2013-07-22: qty 100

## 2013-07-22 MED ORDER — SODIUM CHLORIDE 0.45 % IV SOLN
INTRAVENOUS | Status: DC
  Administered 2013-07-22: 20 mL/h via INTRAVENOUS

## 2013-07-22 MED ORDER — BISACODYL 5 MG PO TBEC
10.0000 mg | DELAYED_RELEASE_TABLET | Freq: Every day | ORAL | Status: DC
  Administered 2013-07-23 – 2013-07-29 (×5): 10 mg via ORAL
  Filled 2013-07-22 (×6): qty 2

## 2013-07-22 MED ORDER — MIDAZOLAM HCL 5 MG/5ML IJ SOLN
INTRAMUSCULAR | Status: DC | PRN
  Administered 2013-07-22: 1 mg via INTRAVENOUS
  Administered 2013-07-22 (×2): 2 mg via INTRAVENOUS
  Administered 2013-07-22: 5 mg via INTRAVENOUS
  Administered 2013-07-22: 3 mg via INTRAVENOUS
  Administered 2013-07-22: 1 mg via INTRAVENOUS

## 2013-07-22 MED ORDER — ROCURONIUM BROMIDE 100 MG/10ML IV SOLN
INTRAVENOUS | Status: DC | PRN
  Administered 2013-07-22 (×3): 50 mg via INTRAVENOUS

## 2013-07-22 MED ORDER — INSULIN ASPART 100 UNIT/ML ~~LOC~~ SOLN: 0.0000 [IU] | SUBCUTANEOUS | Status: DC

## 2013-07-22 MED ORDER — FAMOTIDINE IN NACL 20-0.9 MG/50ML-% IV SOLN
20.0000 mg | Freq: Two times a day (BID) | INTRAVENOUS | Status: AC
  Administered 2013-07-22: 20 mg via INTRAVENOUS

## 2013-07-22 MED ORDER — SODIUM CHLORIDE 0.9 % IJ SOLN
3.0000 mL | Freq: Two times a day (BID) | INTRAMUSCULAR | Status: DC
  Administered 2013-07-23 – 2013-07-24 (×2): 3 mL via INTRAVENOUS
  Administered 2013-07-26 – 2013-07-27 (×2): 10 mL via INTRAVENOUS

## 2013-07-22 MED ORDER — METOPROLOL TARTRATE 12.5 MG HALF TABLET
12.5000 mg | ORAL_TABLET | Freq: Two times a day (BID) | ORAL | Status: DC
  Administered 2013-07-24: 12.5 mg via ORAL
  Filled 2013-07-22 (×5): qty 1

## 2013-07-22 MED ORDER — SODIUM CHLORIDE 0.9 % IV SOLN
250.0000 mL | INTRAVENOUS | Status: DC
  Administered 2013-07-23: 250 mL via INTRAVENOUS

## 2013-07-22 MED ORDER — VECURONIUM BROMIDE 10 MG IV SOLR
INTRAVENOUS | Status: DC | PRN
  Administered 2013-07-22: 3 mg via INTRAVENOUS
  Administered 2013-07-22: 5 mg via INTRAVENOUS

## 2013-07-22 MED ORDER — ACETAMINOPHEN 650 MG RE SUPP: 650.0000 mg | Freq: Once | RECTAL | Status: AC

## 2013-07-22 MED ORDER — INSULIN REGULAR BOLUS VIA INFUSION
0.0000 [IU] | Freq: Three times a day (TID) | INTRAVENOUS | Status: DC
  Filled 2013-07-22: qty 10

## 2013-07-22 MED ORDER — SODIUM CHLORIDE 0.9 % IV SOLN
INTRAVENOUS | Status: DC
  Administered 2013-07-22: 20 mL/h via INTRAVENOUS
  Administered 2013-07-23: 10 mL/h via INTRAVENOUS
  Administered 2013-07-24: 06:00:00 via INTRAVENOUS

## 2013-07-22 MED ORDER — METOPROLOL TARTRATE 1 MG/ML IV SOLN: 2.5000 mg | INTRAVENOUS | Status: DC | PRN

## 2013-07-22 MED ORDER — MIDAZOLAM HCL 2 MG/2ML IJ SOLN: 2.0000 mg | INTRAMUSCULAR | Status: DC | PRN

## 2013-07-22 MED ORDER — SODIUM CHLORIDE 0.9 % IR SOLN
Status: DC | PRN
  Administered 2013-07-22: 1000 mL

## 2013-07-22 MED ORDER — SODIUM CHLORIDE 0.9 % IV SOLN
INTRAVENOUS | Status: DC
  Filled 2013-07-22: qty 1

## 2013-07-22 MED ORDER — LACTATED RINGERS IV SOLN
500.0000 mL | Freq: Once | INTRAVENOUS | Status: AC | PRN
  Administered 2013-07-22: 500 mL via INTRAVENOUS

## 2013-07-22 SURGICAL SUPPLY — 162 items
ADAPTER CARDIO PERF ANTE/RETRO (ADAPTER) ×3 IMPLANT
ADH SKN CLS APL DERMABOND .7 (GAUZE/BANDAGES/DRESSINGS) ×2
ADPR PRFSN 84XANTGRD RTRGD (ADAPTER) ×2
APL SKNCLS STERI-STRIP NONHPOA (GAUZE/BANDAGES/DRESSINGS) ×2
APPLICATOR COTTON TIP 6IN STRL (MISCELLANEOUS) IMPLANT
APPLIER CLIP 9.375 MED OPEN (MISCELLANEOUS)
APPLIER CLIP 9.375 SM OPEN (CLIP)
APR CLP MED 9.3 20 MLT OPN (MISCELLANEOUS)
APR CLP SM 9.3 20 MLT OPN (CLIP)
ATRICLIP EXCLUSION 45 FLEX HDL (Clip) ×1 IMPLANT
ATTRACTOMAT 16X20 MAGNETIC DRP (DRAPES) ×6 IMPLANT
BAG DECANTER FOR FLEXI CONT (MISCELLANEOUS) ×6 IMPLANT
BANDAGE ELASTIC 4 VELCRO ST LF (GAUZE/BANDAGES/DRESSINGS) ×3 IMPLANT
BANDAGE ELASTIC 6 VELCRO ST LF (GAUZE/BANDAGES/DRESSINGS) ×3 IMPLANT
BANDAGE GAUZE ELAST BULKY 4 IN (GAUZE/BANDAGES/DRESSINGS) ×3 IMPLANT
BASKET HEART (ORDER IN 25'S) (MISCELLANEOUS) ×3
BASKET HEART (ORDER IN 25S) (MISCELLANEOUS) ×2 IMPLANT
BENZOIN TINCTURE PRP APPL 2/3 (GAUZE/BANDAGES/DRESSINGS) ×3 IMPLANT
BLADE STERNUM SYSTEM 6 (BLADE) ×5 IMPLANT
BLADE SURG 11 STRL SS (BLADE) ×4 IMPLANT
BLADE SURG ROTATE 9660 (MISCELLANEOUS) IMPLANT
CANISTER SUCTION 2500CC (MISCELLANEOUS) ×6 IMPLANT
CANN PRFSN 3/8X14X24FR PCFC (MISCELLANEOUS)
CANN PRFSN 3/8XCNCT ST RT ANG (MISCELLANEOUS)
CANNULA EZ GLIDE AORTIC 21FR (CANNULA) ×2 IMPLANT
CANNULA FEM VENOUS REMOTE 22FR (CANNULA) ×1 IMPLANT
CANNULA GUNDRY RCSP 15FR (MISCELLANEOUS) ×3 IMPLANT
CANNULA PRFSN 3/8X14X24FR PCFC (MISCELLANEOUS) IMPLANT
CANNULA PRFSN 3/8XCNCT RT ANG (MISCELLANEOUS) IMPLANT
CANNULA VEN MTL TIP RT (MISCELLANEOUS)
CANNULA VENNOUS METAL TIP 20FR (CANNULA) ×1 IMPLANT
CANNULA VENOUS LOW PROF 34X46 (CANNULA) ×2 IMPLANT
CATH CPB KIT OWEN (MISCELLANEOUS) ×3 IMPLANT
CATH FOLEY 2WAY SLVR  5CC 14FR (CATHETERS)
CATH FOLEY 2WAY SLVR 5CC 14FR (CATHETERS) IMPLANT
CATH THORACIC 28FR (CATHETERS) IMPLANT
CATH THORACIC 28FR RT ANG (CATHETERS) IMPLANT
CATH THORACIC 36FR (CATHETERS) ×3 IMPLANT
CATH THORACIC 36FR RT ANG (CATHETERS) ×2 IMPLANT
CLAMP ISOLATOR SYNERGY LG (MISCELLANEOUS) ×3 IMPLANT
CLIP APPLIE 9.375 MED OPEN (MISCELLANEOUS) IMPLANT
CLIP APPLIE 9.375 SM OPEN (CLIP) IMPLANT
CLIP FOGARTY SPRING 6M (CLIP) IMPLANT
CLIP RETRACTION 3.0MM CORONARY (MISCELLANEOUS) ×1 IMPLANT
CLIP TI MEDIUM 24 (CLIP) IMPLANT
CLIP TI WIDE RED SMALL 24 (CLIP) IMPLANT
CLOTH BEACON ORANGE TIMEOUT ST (SAFETY) ×6 IMPLANT
CONN 1/2X1/2X1/2  BEN (MISCELLANEOUS) ×6
CONN 1/2X1/2X1/2 BEN (MISCELLANEOUS) ×2 IMPLANT
CONN 3/8X1/2 ST GISH (MISCELLANEOUS) ×6 IMPLANT
CONN ST 1/4X3/8  BEN (MISCELLANEOUS) ×6
CONN ST 1/4X3/8 BEN (MISCELLANEOUS) IMPLANT
CONN Y 3/8X3/8X3/8  BEN (MISCELLANEOUS)
CONN Y 3/8X3/8X3/8 BEN (MISCELLANEOUS) IMPLANT
CONT SPEC 4OZ CLIKSEAL STRL BL (MISCELLANEOUS) ×1 IMPLANT
COVER MAYO STAND STRL (DRAPES) ×2 IMPLANT
COVER SURGICAL LIGHT HANDLE (MISCELLANEOUS) ×9 IMPLANT
CRADLE DONUT ADULT HEAD (MISCELLANEOUS) ×6 IMPLANT
DERMABOND ADVANCED (GAUZE/BANDAGES/DRESSINGS) ×1
DERMABOND ADVANCED .7 DNX12 (GAUZE/BANDAGES/DRESSINGS) IMPLANT
DRAIN CHANNEL 32F RND 10.7 FF (WOUND CARE) ×6 IMPLANT
DRAPE BILATERAL SPLIT (DRAPES) IMPLANT
DRAPE CARDIOVASCULAR INCISE (DRAPES) ×3
DRAPE CV SPLIT W-CLR ANES SCRN (DRAPES) IMPLANT
DRAPE SLUSH/WARMER DISC (DRAPES) ×6 IMPLANT
DRAPE SRG 135X102X78XABS (DRAPES) ×2 IMPLANT
DRSG COVADERM 4X14 (GAUZE/BANDAGES/DRESSINGS) ×6 IMPLANT
ELECT REM PT RETURN 9FT ADLT (ELECTROSURGICAL) ×12
ELECTRODE REM PT RTRN 9FT ADLT (ELECTROSURGICAL) ×8 IMPLANT
GLOVE BIO SURGEON STRL SZ 6 (GLOVE) ×4 IMPLANT
GLOVE BIO SURGEON STRL SZ 6.5 (GLOVE) ×5 IMPLANT
GLOVE BIO SURGEON STRL SZ7 (GLOVE) ×1 IMPLANT
GLOVE BIO SURGEON STRL SZ7.5 (GLOVE) IMPLANT
GLOVE BIOGEL PI IND STRL 6 (GLOVE) IMPLANT
GLOVE BIOGEL PI IND STRL 6.5 (GLOVE) IMPLANT
GLOVE BIOGEL PI IND STRL 7.0 (GLOVE) IMPLANT
GLOVE BIOGEL PI INDICATOR 6 (GLOVE) ×3
GLOVE BIOGEL PI INDICATOR 6.5 (GLOVE)
GLOVE BIOGEL PI INDICATOR 7.0 (GLOVE)
GLOVE EUDERMIC 7 POWDERFREE (GLOVE) IMPLANT
GLOVE ORTHO TXT STRL SZ7.5 (GLOVE) ×6 IMPLANT
GOWN STRL NON-REIN LRG LVL3 (GOWN DISPOSABLE) ×24 IMPLANT
HEMOSTAT POWDER SURGIFOAM 1G (HEMOSTASIS) ×18 IMPLANT
INSERT FOGARTY 61MM (MISCELLANEOUS) IMPLANT
INSERT FOGARTY XLG (MISCELLANEOUS) ×6 IMPLANT
KIT BASIN OR (CUSTOM PROCEDURE TRAY) ×6 IMPLANT
KIT DRAINAGE VACCUM ASSIST (KITS) ×1 IMPLANT
KIT ROOM TURNOVER OR (KITS) ×6 IMPLANT
KIT SUCTION CATH 14FR (SUCTIONS) ×24 IMPLANT
KIT VASOVIEW W/TROCAR VH 2000 (KITS) ×3 IMPLANT
LEAD PACING MYOCARDI (MISCELLANEOUS) ×3 IMPLANT
LINE VENT (MISCELLANEOUS) ×1 IMPLANT
LOOP VESSEL SUPERMAXI WHITE (MISCELLANEOUS) ×3 IMPLANT
MARKER GRAFT CORONARY BYPASS (MISCELLANEOUS) ×9 IMPLANT
NS IRRIG 1000ML POUR BTL (IV SOLUTION) ×30 IMPLANT
PACK OPEN HEART (CUSTOM PROCEDURE TRAY) ×6 IMPLANT
PAD ARMBOARD 7.5X6 YLW CONV (MISCELLANEOUS) ×9 IMPLANT
PAD ELECT DEFIB RADIOL ZOLL (MISCELLANEOUS) ×3 IMPLANT
PENCIL BUTTON HOLSTER BLD 10FT (ELECTRODE) ×4 IMPLANT
PROBE CRYO2-ABLATION MALLABLE (MISCELLANEOUS) ×1 IMPLANT
PUNCH AORTIC ROTATE 4.0MM (MISCELLANEOUS) IMPLANT
PUNCH AORTIC ROTATE 4.5MM 8IN (MISCELLANEOUS) ×1 IMPLANT
PUNCH AORTIC ROTATE 5MM 8IN (MISCELLANEOUS) IMPLANT
RING MITRAL MEMO 3D 34MM SMD34 (Prosthesis & Implant Heart) ×1 IMPLANT
SET CARDIOPLEGIA MPS 5001102 (MISCELLANEOUS) ×1 IMPLANT
SET IRRIG TUBING LAPAROSCOPIC (IRRIGATION / IRRIGATOR) ×3 IMPLANT
SOLUTION ANTI FOG 6CC (MISCELLANEOUS) IMPLANT
SPONGE GAUZE 4X4 12PLY (GAUZE/BANDAGES/DRESSINGS) ×12 IMPLANT
SPONGE LAP 18X18 X RAY DECT (DISPOSABLE) ×1 IMPLANT
SPONGE LAP 4X18 X RAY DECT (DISPOSABLE) IMPLANT
SUCKER INTRACARDIAC WEIGHTED (SUCKER) ×3 IMPLANT
SUT BONE WAX W31G (SUTURE) ×6 IMPLANT
SUT ETHIBOND (SUTURE) ×2 IMPLANT
SUT ETHIBOND 2 0 SH (SUTURE) ×7 IMPLANT
SUT ETHIBOND 2 0 SH 36X2 (SUTURE) ×4 IMPLANT
SUT ETHIBOND 2 0 V4 (SUTURE) IMPLANT
SUT ETHIBOND 2 0V4 GREEN (SUTURE) IMPLANT
SUT ETHIBOND 4 0 TF (SUTURE) IMPLANT
SUT ETHIBOND 5 0 C 1 30 (SUTURE) ×3 IMPLANT
SUT ETHIBOND X763 2 0 SH 1 (SUTURE) ×13 IMPLANT
SUT GORETEX CV-5THC-13 36IN (SUTURE) ×10 IMPLANT
SUT GORETEX CV4 TH-18 (SUTURE) ×1 IMPLANT
SUT MNCRL AB 3-0 PS2 18 (SUTURE) ×11 IMPLANT
SUT MNCRL AB 4-0 PS2 18 (SUTURE) IMPLANT
SUT PDS AB 1 CTX 36 (SUTURE) ×12 IMPLANT
SUT PROLENE 2 0 SH DA (SUTURE) IMPLANT
SUT PROLENE 3 0 SH 1 (SUTURE) ×3 IMPLANT
SUT PROLENE 3 0 SH DA (SUTURE) ×6 IMPLANT
SUT PROLENE 3 0 SH1 36 (SUTURE) IMPLANT
SUT PROLENE 4 0 RB 1 (SUTURE) ×12
SUT PROLENE 4 0 SH DA (SUTURE) ×6 IMPLANT
SUT PROLENE 4-0 RB1 .5 CRCL 36 (SUTURE) ×4 IMPLANT
SUT PROLENE 5 0 C 1 36 (SUTURE) IMPLANT
SUT PROLENE 6 0 C 1 30 (SUTURE) ×4 IMPLANT
SUT PROLENE 7.0 RB 3 (SUTURE) ×10 IMPLANT
SUT PROLENE 8 0 BV175 6 (SUTURE) ×2 IMPLANT
SUT PROLENE BLUE 7 0 (SUTURE) ×3 IMPLANT
SUT PROLENE POLY MONO (SUTURE) ×3 IMPLANT
SUT SILK  1 MH (SUTURE) ×12
SUT SILK 1 MH (SUTURE) ×4 IMPLANT
SUT STEEL 6MS V (SUTURE) IMPLANT
SUT STEEL STERNAL CCS#1 18IN (SUTURE) ×1 IMPLANT
SUT STEEL SZ 6 DBL 3X14 BALL (SUTURE) ×2 IMPLANT
SUT VIC AB 1 CTX 36 (SUTURE)
SUT VIC AB 1 CTX36XBRD ANBCTR (SUTURE) IMPLANT
SUT VIC AB 2-0 CT1 27 (SUTURE)
SUT VIC AB 2-0 CT1 TAPERPNT 27 (SUTURE) IMPLANT
SUT VIC AB 2-0 CTX 27 (SUTURE) IMPLANT
SUT VIC AB 3-0 SH 27 (SUTURE) ×3
SUT VIC AB 3-0 SH 27X BRD (SUTURE) IMPLANT
SUT VIC AB 3-0 X1 27 (SUTURE) IMPLANT
SUT VICRYL 4-0 PS2 18IN ABS (SUTURE) IMPLANT
SUTURE E-PAK OPEN HEART (SUTURE) ×3 IMPLANT
SYS ATRICLIP LAA EXCLUSION 45 (CLIP) IMPLANT
SYSTEM SAHARA CHEST DRAIN ATS (WOUND CARE) ×6 IMPLANT
TOWEL OR 17X24 6PK STRL BLUE (TOWEL DISPOSABLE) ×12 IMPLANT
TOWEL OR 17X26 10 PK STRL BLUE (TOWEL DISPOSABLE) ×12 IMPLANT
TRAY FOLEY IC TEMP SENS 14FR (CATHETERS) ×6 IMPLANT
TUBE SUCT INTRACARD DLP 20F (MISCELLANEOUS) ×3 IMPLANT
TUBING INSUFFLATION 10FT LAP (TUBING) ×6 IMPLANT
UNDERPAD 30X30 INCONTINENT (UNDERPADS AND DIAPERS) ×6 IMPLANT
WATER STERILE IRR 1000ML POUR (IV SOLUTION) ×12 IMPLANT

## 2013-07-22 NOTE — Progress Notes (Signed)
Recruitment maneuver performed due to desat. PC 40, rate of 10, peep of 5, i time of 3.0 and FiO2 of 100% for 2 minutes. Patient tolerated well. Placed back on previous mode and sats came up to 98%.

## 2013-07-22 NOTE — Preoperative (Signed)
Beta Blockers   Reason not to administer Beta Blockers:Not Applicable 

## 2013-07-22 NOTE — Anesthesia Procedure Notes (Signed)
Procedure Name: Intubation Date/Time: 07/22/2013 8:46 AM Performed by: Jefm Miles E Pre-anesthesia Checklist: Patient identified, Timeout performed, Emergency Drugs available, Suction available and Patient being monitored Patient Re-evaluated:Patient Re-evaluated prior to inductionOxygen Delivery Method: Circle system utilized Preoxygenation: Pre-oxygenation with 100% oxygen Intubation Type: IV induction Ventilation: Mask ventilation without difficulty Laryngoscope Size: Mac and 3 Grade View: Grade I Tube type: Oral Tube size: 8.0 mm Number of attempts: 1 Airway Equipment and Method: Stylet Placement Confirmation: ETT inserted through vocal cords under direct vision,  breath sounds checked- equal and bilateral and positive ETCO2 Secured at: 23 cm Tube secured with: Tape Dental Injury: Teeth and Oropharynx as per pre-operative assessment

## 2013-07-22 NOTE — Progress Notes (Signed)
  Echocardiogram Echocardiogram Transesophageal has been performed.  Henry Page 07/22/2013, 10:21 AM

## 2013-07-22 NOTE — Anesthesia Preprocedure Evaluation (Addendum)
Anesthesia Evaluation  Patient identified by MRN, date of birth, ID band Patient awake    Reviewed: Allergy & Precautions, H&P , NPO status , Patient's Chart, lab work & pertinent test results  Airway Mallampati: II TM Distance: >3 FB Neck ROM: Full    Dental  (+) Teeth Intact and Dental Advisory Given   Pulmonary shortness of breath, sleep apnea ,          Cardiovascular hypertension, Pt. on medications + CAD and +CHF + dysrhythmias Atrial Fibrillation Rhythm:Irregular Rate:Normal     Neuro/Psych    GI/Hepatic   Endo/Other    Renal/GU Renal InsufficiencyRenal disease     Musculoskeletal   Abdominal   Peds  Hematology   Anesthesia Other Findings   Reproductive/Obstetrics                          Anesthesia Physical Anesthesia Plan  ASA: III  Anesthesia Plan: General   Post-op Pain Management:    Induction: Intravenous  Airway Management Planned: Oral ETT  Additional Equipment: Arterial line, CVP, PA Cath and 3D TEE  Intra-op Plan:   Post-operative Plan: Post-operative intubation/ventilation  Informed Consent: I have reviewed the patients History and Physical, chart, labs and discussed the procedure including the risks, benefits and alternatives for the proposed anesthesia with the patient or authorized representative who has indicated his/her understanding and acceptance.     Plan Discussed with: CRNA and Surgeon  Anesthesia Plan Comments:         Anesthesia Quick Evaluation

## 2013-07-22 NOTE — Op Note (Signed)
CARDIOTHORACIC SURGERY OPERATIVE NOTE  Date of Procedure:  07/22/2013  Preoperative Diagnosis:   Severe 3-vessel Coronary Artery Disease  Severe Mitral Regurgitation  Recurrent Persistent Atrial Fibrillation  Patent Foramen Ovale  Postoperative Diagnosis: Same  Procedure:    Coronary Artery Bypass Grafting x 4  Left Internal Mammary Artery to Distal Left Anterior Descending Coronary Artery Saphenous Vein Graft to Distal Right Coronary Artery Sequential Saphenous Vein Graft to Ramus Intermediate Branch and Obtuse Marginal Branch of Left Circumflex Coronary Artery Endoscopic Vein Harvest from Right Thigh    Mitral Valve Repair  Complex valvuloplasty including triangular resection of Posterior Leaflet  Artificial Gore-tex neocord placement x2  Sorin Memo 3D ring annuloplasty (size 34mm, catalog [id number], serial [id number])   Maze Procedure   complete biatrial atrial lesion set using bipolar radiofrequency and cryothermy ablation  clipping of left atrial appendage (Atriclip size 45mm)    Closure of Patent Foramen Ovale   Surgeon: Salvatore Decent. 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 posterior leaflet  Type II mitral valve dysfunction with severe mitral regurgitation  Normal LV systolic function    Good quality LIMA and SVG conduit for grafting  Good quality target vessels for grafting  Small patent foramen ovale  Trace residual mitral regurgitation after successful valve repair             BRIEF CLINICAL NOTE AND INDICATIONS FOR SURGERY  Patient is a 67 year old married white male from [facility] with known history of mitral valve prolapse and mitral regurgitation who was transferred from [facility] for evaluation of severe mitral regurgitation with rapid atrial fibrillation and acute combined  systolic and diastolic congestive heart failure.  The patient reports that he has known for many years that he had a heart murmur. He has been told in the past that he had mitral valve prolapse and mitral regurgitation. He has been treated intermittently for hypertension and in the past had been seen by Dr. Excell Seltzer, most recently in 2008. The patient has otherwise remained remarkably healthy all of his life and physically active. He walks a mile nearly every day and reports no significant physical limitations. He does note a long history of mild exertional shortness of breath. However, this had been quite stable until recently when he somewhat acutely developed severe exertional shortness of breath and intermittent episodes of resting shortness of breath. He has a long history of palpitations without dizzy spells or syncope. He has never had any chest pain or chest tightness.   The patient's symptoms of shortness of breath and palpitations progressed until he presented to [facility] where he was found to be in supraventricular tachycardia. BNP level was 489.  CT scan of the chest revealed pulmonary edema with coronary atherosclerosis in the left anterior descending coronary artery, left circumflex coronary artery. In the ED he was treated with intravenous diltiazem, and his shortness of breath improved with medical therapy including beta blocker for rate control and intravenous Lasix. A transthoracic echocardiogram was performed that reportedly demonstrated mitral valve prolapse with an obvious flail segment of the posterior leaflet of the mitral valve and severe mitral regurgitation. Left ventricular ejection fraction was estimated 55%. The patient was reportedly started on a cephalosporin empirically because of the possibility of bacterial endocarditis, and he was transferred to [facility] for further management. Transesophageal echocardiogram confirmed the presence of flail segment  of the  posterior leaflet of the mitral valve and severe mitral regurgitation. Left and right heart catheterization was performed and notable for the presence of severe three-vessel coronary artery disease.    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  He has been seen in consultation and counseled at length regarding the indications, risks and potential benefits of surgery.  All questions have been answered, and the patient provides full informed consent for the operation as described.     DETAILS OF THE OPERATIVE PROCEDURE  Preparation:  The patient is brought to the operating room on the above mentioned date and central monitoring was established by the anesthesia team including placement of Swan-Ganz catheter and radial arterial line. The patient is placed in the supine position on the operating table.  Intravenous antibiotics are administered. General endotracheal anesthesia is induced uneventfully. A Foley catheter is placed.  Baseline transesophageal echocardiogram was performed.  Findings were notable for an obvious flail segment of the posterior leaflet (P2) and severe mitral regurgitation. Left ventricular systolic function was normal. There was mild left ventricular chamber enlargement. The aortic valve appeared normal. There was no tricuspid regurgitation.  The patient's chest, abdomen, both groins, and both lower extremities are prepared and draped in a sterile manner. A time out procedure is performed.   Surgical Approach and Conduit Harvest:  A median sternotomy incision was performed and the left internal mammary artery is dissected from the chest wall and prepared for bypass grafting. The left internal mammary artery is notably good quality conduit. Simultaneously, saphenous vein is obtained from the patient's right thigh using  endoscopic vein harvest technique. The saphenous vein is notably good quality conduit. After removal of the saphenous vein, the small surgical incisions in the lower extremity are closed with absorbable suture. Following systemic heparinization, the left internal mammary artery was transected distally noted to have excellent flow.   Extracorporeal Cardiopulmonary Bypass and Myocardial Protection:  The right common femoral vein is cannulated using the Seldinger technique and a guidewire advanced into the right atrium using TEE guidance.  The patient is heparinized systemically and the femoral vein cannulated using a 22 Fr long femoral venous cannula.  The ascending aorta is cannulated for cardiopulmonary bypass.  Adequate heparinization is verified.   A retrograde cardioplegia cannula is placed through the right atrium into the coronary sinus.   The entire pre-bypass portion of the operation was notable for stable hemodynamics.  Cardiopulmonary bypass was begun and the surface of the heart is inspected.  A second venous cannula is placed directly into the superior vena cava.   A cardioplegia cannula is placed in the ascending aorta.  A temperature probe was placed in the interventricular septum.  The patient is cooled to 32C systemic temperature.  The aortic cross clamp is applied and cold blood cardioplegia is delivered initially in an antegrade fashion through the aortic root.   Supplemental cardioplegia is given retrograde through the coronary sinus catheter.  Iced saline slush is applied for topical hypothermia.  The initial cardioplegic arrest is rapid with early diastolic arrest.  Repeat doses of cardioplegia are administered intermittently throughout the entire cross clamp portion of the operation through the aortic root, through the coronary sinus catheter, and through subsequently placed vein grafts in order to maintain completely flat electrocardiogram and septal myocardial temperature below  15C.  Myocardial protection was felt to be excellent.  Maze Procedure (left atrial lesion set - part I):  The AtriCure Synergy bipolar radiofrequency ablation clamp is used for all radiofrequency ablation lesions for the maze procedure.  The Atricure CryoICE nitrous oxide cryothermy system is utilized for all cryothermy ablation lesions.   The heart is retracted towards the surgeon's side and the left sided pulmonary veins exposed.  An elliptical ablation lesion is created around the base of the left sided pulmonary veins.  A similar elliptical lesion was created around the base of the left atrial appendage.  The left atrial appendage was obliterated using an Atricure left atrial appendage clip (Atriclip, size 45mm).  The heart was replaced into the pericardial sac.   Coronary Artery Bypass Grafting:   The distal right coronary artery was grafted using a reversed saphenous vein graft in an end-to-side fashion.  At the site of distal anastomosis the target vessel was good quality and measured approximately 2.0 mm in diameter.  The ramus intermediate branch is grafted using a saphenous vein graft and a side to side fashion. At the site of distal anastomosis the target vessel was good quality and measured 2.0 mm in diameter.  The first obtuse marginal branch of the left circumflex coronary artery was grafted using a sequential saphenous vein graft in an end-to-side fashion using the distal portion of vein graft placed to the ramus intermediate branch.  At the site of distal anastomosis the target vessel was good quality and measured approximately 1.2 mm in diameter.  The distal left anterior coronary artery was grafted with the left internal mammary artery in an end-to-side fashion.  At the site of distal anastomosis the target vessel was good quality and measured approximately 1.8 mm in diameter.   Maze Procedure (left atrial lesion set - part II):  A left atriotomy incision was performed  through the interatrial groove and extended partially across the back wall of the left atrium after opening the oblique sinus inferiorly.  The floor of the left atrium and the mitral valve were exposed using a self-retaining retractor.    An obvious patent foramen ovale is identified along the intra-atrial septum. This is over sewn using a single 4-0 Prolene suture.  An ablation lesion was placed around the right sided pulmonary veins using the bipolar clamp with one limb of the clamp along the endocardial surface and one along the epicardial surface posteriorly.  A bipolar ablation lesion was placed across the dome of the left atrium from the cephalad apex of the atriotomy incision to reach the cephalad apex of the elliptical lesion around the left sided pulmonary veins.  A similar bipolar lesion was placed across the back wall of the left atrium from the caudad apex of the atriotomy incision to reach the caudad apex of the elliptical lesion around the left sided pulmonary veins, thereby completing a box.  Finally another bipolar lesion was placed across the back wall of the left atrium from the caudad apex of the atriotomy incision towards the posterior mitral valve annulus.  This lesion was completed along the endocardial surface onto the posterior mitral annulus with a 3 minute duration cryothermy lesion, followed by a second cryothermy lesion along the posterior epicardial surface of the left atrium to the coronary sinus.  This completes the entire left side lesion set of the Cox maze procedure.   Mitral Valve Repair:  The mitral valve was inspected and notable for fibroelastic deficiency type degenerative disease. There was a large flail segment (P2) of the posterior leaflet.  The remainder of the mitral valve apparatus was reasonably normal.  Interrupted 2-0 Ethibond horizontal mattress sutures are placed circumferentially around the mitral valve annulus. The sutures will ultimately be utilized for  ring annuloplasty. At this juncture there are utilized to suspend the valve symmetrically to facilitate repair.  The flail portion of P2 was repaired using a simple triangular resection. A total of approximately 35% of the total surface area of P2 is resected. The intervening vertical defect in P2 is closed using interrupted everting simple CV 5 Gore-Tex suture.  A pledgeted CV 4 Gore-Tex suture is placed through the head of the posterior papillary muscle and tied. Each limb of the suture was then sewn into the P2 segment of the posterior leaflet to support the free margin. Initially each limb is passed from the ventricular to the atrial surface close to the free margin of the leaflet and then subsequently woven in a diamond shaped orientation towards the posterior annulus. The 2 artificial Gore-Tex cords are then tied while the heart is filled with saline to adjust the sutures to the appropriate length.  At this juncture the valve was tested with saline and is notably perfectly competent.  The valve was sized to accept either a 32 or 34 mm annuloplasty ring based upon the overall surface area of the anterior leaflet, the distance between the left and right fibrous trigone, and the overall height of the anterior leaflet.  Because of the somewhat excessive leaflet tissue valve is up sized to a 34 mm ring.  A Sorin memo 3-D annuloplasty ring (size 34mm, catalog [id number], serial [id number]) is sewn in place uneventfully.  The valve was tested with saline and is notably perfectly competent with a broad symmetrical line of coaptation. Rewarming is begun.  The atriotomy was closed using a 2-layer closure of running 3-0 Prolene suture after placing a sump drain across the mitral valve to serve as a left ventricular vent.    Both proximal saphenous vein anastomoses are performed directly to the ascending aorta prior to removal of the aortic cross clamp.  Left ventricular septal temperature rises rapidly with  reperfusion of the left internal mammary artery graft.  One final dose of warm retrograde "hot shot" cardioplegia was administered retrograde through the coronary sinus catheter while all air was evacuated through the aortic root.  The aortic cross clamp was removed after a total cross clamp time of 185 minutes.  The heart began to beat spontaneously without need for cardioversion.   Maze Procedure (right atrial lesion set):  The retrograde cardioplegia cannula was removed and the small hole in the right atrium extended a short distance.  The AtriCure Synergy bipolar radiofrequency ablation clamp is utilized to create a series of linear lesions in the right atrium, each with one limb of the clamp along the endocardial surface and the other along the epicardial surface. The first lesion is placed from the posterior apex of the atriotomy incision and along the lateral wall of the right atrium to reach the lateral aspect of the superior vena cava. A second lesion is placed in the opposite direction from the posterior apex of the atriotomy incision along the lateral wall to reach the lateral aspect of the inferior vena cava. A third lesion is placed from the midportion of the atriotomy incision extending at a right angle to reach the tip of the right atrial appendage. A fourth lesion is placed from the anterior apex of the atriotomy incision in an anterior and inferior direction  to reach the acute margin of the heart. Finally, the cryotherapy probe is utilized to complete the right atrial lesion set by placing the probe along the endocardial surface of the right atrium from the anterior apex of the atriotomy incision to reach the tricuspid annulus at the 2:00 position. The right atriotomy incision is closed with a 2 layer closure of running 4-0 Prolene suture.   Procedure Completion:  Epicardial pacing wires are fixed to the right ventricular outflow tract and to the right atrial appendage. The patient is  rewarmed to 37C temperature. The aortic and left ventricular vents are removed.  The patient is weaned and disconnected from cardiopulmonary bypass.  The patient's rhythm at separation from bypass was AV paced.  The patient was weaned from cardioplegic bypass on low dose dopamine and milrinone infusions. Total cardiopulmonary bypass time for the operation was 229 minutes.  Followup transesophageal echocardiogram performed after separation from bypass revealed a well seated mitral annuloplasty ring.  The mitral valve was functioning normally. There was trivial residual mitral regurgitation. Left ventricular function was unchanged from preoperatively.  The aortic and superior vena cava cannula were removed uneventfully. Protamine was administered to reverse the anticoagulation. The femoral venous cannula was removed and manual pressure held on the groin for 30 minutes.  The mediastinum and pleural spaces were inspected for hemostasis and irrigated with saline solution. The mediastinum and both pleural space were drained using 4 chest tubes placed through separate stab incisions inferiorly.  The soft tissues anterior to the aorta were reapproximated loosely. The sternum is closed with double strength sternal wire. The soft tissues anterior to the sternum were closed in multiple layers and the skin is closed with a running subcuticular skin closure.   The post-bypass portion of the operation was notable for stable rhythm and hemodynamics.   No blood products were administered during the operation.   Patient Disposition:  The patient tolerated the procedure well and is transported to the surgical intensive care in stable condition. There are no intraoperative complications. All sponge instrument and needle counts are verified correct at completion of the operation.     Salvatore Decent. Cornelius Moras MD 07/22/2013 3:47 PM

## 2013-07-22 NOTE — Progress Notes (Signed)
Patient Name: Henry Page Date of Encounter: 07/22/2013    Principal Problem:   Acute combined systolic and diastolic congestive heart failure Active Problems:   Mitral regurgitation   Atrial fibrillation   Essential hypertension, benign   CAD (coronary artery disease)   CKD (chronic kidney disease), stage III   SUBJECTIVE  No chest pain or significant sob.  Throat congestion has improved.  He denies chest pain, palpitations, dyspnea, pnd, orthopnea, n, v, dizziness, syncope, edema, weight gain, or early satiety.  CURRENT MEDS . aminocaproic acid (AMICAR) for OHS   Intravenous To OR  . amiodarone  400 mg Oral BID PC  . amoxicillin  500 mg Oral TID  . aspirin EC  81 mg Oral Daily  . atorvastatin  40 mg Oral q1800  . bisacodyl  5 mg Oral Once  . cefUROXime (ZINACEF)  IV  1.5 g Intravenous To OR  . cefUROXime (ZINACEF)  IV  750 mg Intravenous To OR  . dexmedetomidine  0.1-0.7 mcg/kg/hr Intravenous To OR  . DOPamine  2-20 mcg/kg/min Intravenous To OR  . epinephrine  0.5-20 mcg/min Intravenous To OR  . heparin-papaverine-plasmalyte irrigation   Irrigation To OR  . heparin 30,000 units/NS 1000 mL solution for CELLSAVER   Other To OR  . insulin (NOVOLIN-R) infusion   Intravenous To OR  . magnesium sulfate  40 mEq Other To OR  . metoprolol tartrate  12.5 mg Oral Once  . metoprolol tartrate  25 mg Oral BID  . nitroGLYCERIN  2-200 mcg/min Intravenous To OR  . phenylephrine (NEO-SYNEPHRINE) Adult infusion  30-200 mcg/min Intravenous To OR  . potassium chloride  80 mEq Other To OR  . sodium chloride  3 mL Intravenous Q12H  . vancomycin 1000 mg in NS (1000 ml) irrigation for Dr. Cornelius Moras case   Irrigation To OR  . vancomycin  1,250 mg Intravenous To OR   OBJECTIVE  Filed Vitals:   07/21/13 0901 07/21/13 1411 07/21/13 2230 07/22/13 0537  BP: 138/92 144/105 137/102 134/77  Pulse: 98 82 73 64  Temp: 98.3 F (36.8 C) 98.4 F (36.9 C) 97.7 F (36.5 C) 97.7 F (36.5 C)  TempSrc:  Oral  Oral Oral  Resp:  16 18 18   Height:      Weight:    169 lb 9.6 oz (76.93 kg)  SpO2: 94% 94% 95% 95%    Intake/Output Summary (Last 24 hours) at 07/22/13 0723 Last data filed at 07/21/13 2233  Gross per 24 hour  Intake    963 ml  Output   2850 ml  Net  -1887 ml   Filed Weights   07/21/13 0527 07/22/13 0537  Weight: 174 lb 1.6 oz (78.971 kg) 169 lb 9.6 oz (76.93 kg)   PHYSICAL EXAM  General: Pleasant, NAD. Neuro: Alert and oriented X 3. Moves all extremities spontaneously. Psych: Normal affect. HEENT:  Normal  Neck: Supple, difficult to assess jvp 2/2 girth.   Lungs:  Resp regular and unlabored, CTA. Heart: RRR no s3, s4, 3/6 syst murmur loudest @ apex but heard throughout precordium and left back. Abdomen: Soft, protuberant, somewhat firm, non-tender, BS + x 4.  Extremities: No clubbing, cyanosis or edema. DP/PT/Radials 2+ and equal bilaterally.  Accessory Clinical Findings  CBC  Recent Labs  07/20/13 1010 07/20/13 1555 07/21/13 0821 07/22/13 0525  WBC 14.3* 12.1* 10.1 8.7  NEUTROABS  --  9.1*  --   --   HGB 13.1 13.0 11.9* 13.0  HCT 39.8 38.7* 37.3*  38.9*  MCV 97.3 97.0 96.6 96.0  PLT 325 303 267 277   Basic Metabolic Panel  Recent Labs  07/21/13 0821 07/22/13 0525  NA 138 138  K 4.2 4.1  CL 102 102  CO2 23 27  GLUCOSE 113* 86  BUN 34* 36*  CREATININE 1.72* 1.77*  CALCIUM 9.1 9.2   Liver Function Tests  Recent Labs  07/20/13 1010 07/20/13 1555  AST 32 28  ALT 41 38  ALKPHOS 100 100  BILITOT 0.8 0.8  PROT 6.9 6.7  ALBUMIN 3.7 3.6   Hemoglobin A1C  Recent Labs  07/20/13 1010  HGBA1C 5.2   pBNP 6120  TELE  Afib, rate controlled.  ECG  Afib, 89, poor r prog, lvh - no acute st/t changes.  Radiology/Studies  Dg Chest 2 View  07/20/2013   *RADIOLOGY REPORT*  Clinical Data: CHF versus pneumonia.  CHEST - 2 VIEW  Comparison: None.  Findings: Small bilateral pleural effusions, right slightly greater than left.  No focal  airspace opacities or evidence of edema. Heart is borderline in size.  Mild peribronchial thickening.  No acute bony abnormality.  IMPRESSION: Small bilateral pleural effusions.  Borderline cardiomegaly and bronchitic changes.   Original Report Authenticated By: Charlett Nose, M.D.   ASSESSMENT AND PLAN  1.  Acute on chronic diastolic CHF:  In setting of severe MR.  He has been more dyspneic with cough and congestion at home though he denies pnd, orthopnea, edema, or early satiety.  His weight is down 5 lbs since yesterday with excellent response to diuretics.   2.  Sev MR:  Pending MV repair and cabg today.  3.  CAD:  No chest pain.  4.  Afib:  Rate well controlled.  Cont BB.  CHA2DS2VASc= 5.  Not currently on oral anticoagulation 2/2 pending thoracic surgery.  Will look to add post-op.  5.  CKD III:  Creat up sl this AM consistent with diuresis.  Agree that patient ready for surgery today. I don't think there is any advantage to waiting longer.  Lindajo Royal [facility] 07/22/2013 7:23 AM

## 2013-07-22 NOTE — Anesthesia Postprocedure Evaluation (Signed)
  Anesthesia Post-op Note  Patient: Henry Page  Procedure(s) Performed: Procedure(s): MITRAL VALVE REPAIR (MVR) (N/A) CORONARY ARTERY BYPASS GRAFTING (CABG) (N/A) MAZE (N/A) INTRAOPERATIVE TRANSESOPHAGEAL ECHOCARDIOGRAM (N/A) PATENT FORAMEN OVALE CLOSURE (N/A)  Patient Location: SICU  Anesthesia Type:General  Level of Consciousness: Patient remains intubated per anesthesia plan  Airway and Oxygen Therapy: Patient remains intubated per anesthesia plan and Patient placed on Ventilator (see vital sign flow sheet for setting)  Post-op Pain: none  Post-op Assessment: Post-op Vital signs reviewed, Patient's Cardiovascular Status Stable and Respiratory Function Stable  Post-op Vital Signs: Reviewed and stable  Complications: No apparent anesthesia complications

## 2013-07-22 NOTE — Brief Op Note (Addendum)
07/20/2013 - 07/22/2013  1:54 PM  PATIENT:  Henry Page  67 y.o. male  PRE-OPERATIVE DIAGNOSIS:  1. Coronary Artery Disease  2. Severe MR  POST-OPERATIVE DIAGNOSIS:   1. Coronary artery disease 2. Mitral valve regurgitation 3. 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

## 2013-07-22 NOTE — Transfer of Care (Signed)
Immediate Anesthesia Transfer of Care Note  Patient: Henry Page  Procedure(s) Performed: Procedure(s): MITRAL VALVE REPAIR (MVR) (N/A) CORONARY ARTERY BYPASS GRAFTING (CABG) (N/A) MAZE (N/A) INTRAOPERATIVE TRANSESOPHAGEAL ECHOCARDIOGRAM (N/A) PATENT FORAMEN OVALE CLOSURE (N/A)  Patient Location: SICU  Anesthesia Type:General  Level of Consciousness: Patient remains intubated per anesthesia plan  Airway & Oxygen Therapy: Patient remains intubated per anesthesia plan and Patient placed on Ventilator (see vital sign flow sheet for setting)  Post-op Assessment: Report given to PACU RN  Post vital signs: Reviewed  Complications: No apparent anesthesia complications

## 2013-07-23 ENCOUNTER — Encounter (HOSPITAL_COMMUNITY): Payer: Self-pay | Admitting: Certified Registered Nurse Anesthetist

## 2013-07-23 ENCOUNTER — Inpatient Hospital Stay (HOSPITAL_COMMUNITY): Payer: BC Managed Care – PPO

## 2013-07-23 LAB — CREATININE, SERUM: GFR calc Af Amer: 57 mL/min — ABNORMAL LOW (ref >90)

## 2013-07-23 LAB — POCT I-STAT 3, ART BLOOD GAS (G3+)
Acid-base deficit: 3 mmol/L — ABNORMAL HIGH (ref 0.0–2.0)
Bicarbonate: 21.3 mEq/L (ref 20.0–24.0)
Bicarbonate: 22.1 mEq/L (ref 20.0–24.0)
Bicarbonate: 22.3 mEq/L (ref 20.0–24.0)
O2 Saturation: 94 %
O2 Saturation: 92 %
Patient temperature: 37.2
Patient temperature: 37.3
Patient temperature: 37.7
TCO2: 22 mmol/L (ref 0–100)
TCO2: 22 mmol/L (ref 0–100)
TCO2: 23 mmol/L (ref 0–100)
TCO2: 23 mmol/L (ref 0–100)
pCO2 arterial: 32.8 mmHg — ABNORMAL LOW (ref 35.0–45.0)
pCO2 arterial: 33.3 mmHg — ABNORMAL LOW (ref 35.0–45.0)
pCO2 arterial: 35 mmHg (ref 35.0–45.0)
pH, Arterial: 7.386 (ref 7.350–7.450)
pH, Arterial: 7.415 (ref 7.350–7.450)
pO2, Arterial: 63 mmHg — ABNORMAL LOW (ref 80.0–100.0)

## 2013-07-23 LAB — BASIC METABOLIC PANEL
BUN: 21 mg/dL (ref 6–23)
CO2: 21 mEq/L (ref 19–32)
Calcium: 7.1 mg/dL — ABNORMAL LOW (ref 8.4–10.5)
Chloride: 107 mEq/L (ref 96–112)
Creatinine, Ser: 1.13 mg/dL (ref 0.50–1.35)
GFR calc Af Amer: 70 mL/min — ABNORMAL LOW (ref >90)
GFR calc Af Amer: 76 mL/min — ABNORMAL LOW (ref >90)
GFR calc non Af Amer: 66 mL/min — ABNORMAL LOW (ref >90)
Potassium: 4.1 mEq/L (ref 3.5–5.1)
Potassium: 4.4 mEq/L (ref 3.5–5.1)
Sodium: 137 mEq/L (ref 135–145)

## 2013-07-23 LAB — CBC
HCT: 29.4 % — ABNORMAL LOW (ref 39.0–52.0)
HCT: 30.6 % — ABNORMAL LOW (ref 39.0–52.0)
MCH: 31 pg (ref 26.0–34.0)
MCH: 31.3 pg (ref 26.0–34.0)
MCHC: 33.3 g/dL (ref 30.0–36.0)
MCV: 93.9 fL (ref 78.0–100.0)
Platelets: 154 10*3/uL (ref 150–400)
Platelets: 146 10*3/uL — ABNORMAL LOW (ref 150–400)
RBC: 3.28 MIL/uL — ABNORMAL LOW (ref 4.22–5.81)
RDW: 15.4 % (ref 11.5–15.5)
RDW: 15.4 % (ref 11.5–15.5)
RDW: 15.5 % (ref 11.5–15.5)
WBC: 12.9 10*3/uL — ABNORMAL HIGH (ref 4.0–10.5)

## 2013-07-23 LAB — POCT I-STAT, CHEM 8
Calcium, Ion: 1.05 mmol/L — ABNORMAL LOW (ref 1.13–1.30)
Chloride: 101 mEq/L (ref 96–112)
Chloride: 105 mEq/L (ref 96–112)
Creatinine, Ser: 1.4 mg/dL — ABNORMAL HIGH (ref 0.50–1.35)
Creatinine, Ser: 1.4 mg/dL — ABNORMAL HIGH (ref 0.50–1.35)
Glucose, Bld: 131 mg/dL — ABNORMAL HIGH (ref 70–99)
Glucose, Bld: 156 mg/dL — ABNORMAL HIGH (ref 70–99)
HCT: 29 % — ABNORMAL LOW (ref 39.0–52.0)
Hemoglobin: 9.9 g/dL — ABNORMAL LOW (ref 13.0–17.0)
Potassium: 3.8 mEq/L (ref 3.5–5.1)
Potassium: 3.9 mEq/L (ref 3.5–5.1)

## 2013-07-23 LAB — GLUCOSE, CAPILLARY
Glucose-Capillary: 100 mg/dL — ABNORMAL HIGH (ref 70–99)
Glucose-Capillary: 112 mg/dL — ABNORMAL HIGH (ref 70–99)
Glucose-Capillary: 89 mg/dL (ref 70–99)
Glucose-Capillary: 111 mg/dL — ABNORMAL HIGH (ref 70–99)

## 2013-07-23 MED ORDER — INSULIN ASPART 100 UNIT/ML ~~LOC~~ SOLN
0.0000 [IU] | SUBCUTANEOUS | Status: DC
  Administered 2013-07-23: 20:00:00 via SUBCUTANEOUS
  Administered 2013-07-23 (×2): 2 [IU] via SUBCUTANEOUS

## 2013-07-23 MED ORDER — PROMETHAZINE HCL 25 MG/ML IJ SOLN: 12.5000 mg | Freq: Four times a day (QID) | INTRAMUSCULAR | Status: DC | PRN

## 2013-07-23 MED ORDER — CALCIUM CHLORIDE 10 % IV SOLN
1.0000 g | Freq: Once | INTRAVENOUS | Status: AC
  Administered 2013-07-23: 1 g via INTRAVENOUS

## 2013-07-23 MED ORDER — POTASSIUM CHLORIDE 10 MEQ/50ML IV SOLN
10.0000 meq | INTRAVENOUS | Status: AC
  Administered 2013-07-23 (×3): 10 meq via INTRAVENOUS

## 2013-07-23 MED ORDER — METOCLOPRAMIDE HCL 5 MG/ML IJ SOLN
10.0000 mg | Freq: Four times a day (QID) | INTRAMUSCULAR | Status: DC | PRN
  Administered 2013-07-24: 10 mg via INTRAVENOUS
  Filled 2013-07-23: qty 2

## 2013-07-23 MED ORDER — FUROSEMIDE 10 MG/ML IJ SOLN
10.0000 mg/h | INTRAVENOUS | Status: DC
  Administered 2013-07-23: 10 mg/h via INTRAVENOUS
  Filled 2013-07-23 (×3): qty 25

## 2013-07-23 MED ORDER — POTASSIUM CHLORIDE 10 MEQ/50ML IV SOLN
10.0000 meq | INTRAVENOUS | Status: AC | PRN
  Administered 2013-07-24 (×3): 10 meq via INTRAVENOUS
  Filled 2013-07-23: qty 150

## 2013-07-23 MED ORDER — AMIODARONE HCL IN DEXTROSE 360-4.14 MG/200ML-% IV SOLN
30.0000 mg/h | INTRAVENOUS | Status: DC
  Administered 2013-07-23 – 2013-07-24 (×2): 30 mg/h via INTRAVENOUS
  Filled 2013-07-23 (×4): qty 200

## 2013-07-23 MED ORDER — ALBUMIN HUMAN 5 % IV SOLN
12.5000 g | Freq: Once | INTRAVENOUS | Status: AC
  Administered 2013-07-23: 12.5 g via INTRAVENOUS

## 2013-07-23 MED FILL — Heparin Sodium (Porcine) Inj 1000 Unit/ML: INTRAMUSCULAR | Qty: 30 | Status: AC

## 2013-07-23 MED FILL — Sodium Bicarbonate IV Soln 8.4%: INTRAVENOUS | Qty: 50 | Status: AC

## 2013-07-23 MED FILL — Sodium Chloride IV Soln 0.9%: INTRAVENOUS | Qty: 1000 | Status: AC

## 2013-07-23 MED FILL — Mannitol IV Soln 20%: INTRAVENOUS | Qty: 500 | Status: AC

## 2013-07-23 MED FILL — Lidocaine HCl IV Inj 20 MG/ML: INTRAVENOUS | Qty: 5 | Status: AC

## 2013-07-23 MED FILL — Electrolyte-R (PH 7.4) Solution: INTRAVENOUS | Qty: 5000 | Status: AC

## 2013-07-23 MED FILL — Heparin Sodium (Porcine) Inj 1000 Unit/ML: INTRAMUSCULAR | Qty: 20 | Status: AC

## 2013-07-23 MED FILL — Potassium Chloride Inj 2 mEq/ML: INTRAVENOUS | Qty: 40 | Status: AC

## 2013-07-23 MED FILL — Sodium Chloride Irrigation Soln 0.9%: Qty: 3000 | Status: AC

## 2013-07-23 MED FILL — Magnesium Sulfate Inj 50%: INTRAMUSCULAR | Qty: 10 | Status: AC

## 2013-07-23 NOTE — Progress Notes (Signed)
[address]       [name] [address]             [phone]        CARDIOTHORACIC SURGERY PROGRESS NOTE   R1 Day Post-Op Procedure(s) (LRB): MITRAL VALVE REPAIR (MVR) (N/A) CORONARY ARTERY BYPASS GRAFTING (CABG) (N/A) MAZE (N/A) INTRAOPERATIVE TRANSESOPHAGEAL ECHOCARDIOGRAM (N/A) PATENT FORAMEN OVALE CLOSURE (N/A)  Subjective: Awake and alert on vent.  Breathing comfortably.  Denies pain.  Objective: Vital signs: BP Readings from Last 1 Encounters:  07/23/13 109/71   Pulse Readings from Last 1 Encounters:  07/23/13 79   Resp Readings from Last 1 Encounters:  07/23/13 20   Temp Readings from Last 1 Encounters:  07/23/13 99.5 F (37.5 C)     Hemodynamics: PAP: (26-38)/(13-20) 37/14 mmHg CO:  [3.2 L/min-4 L/min] 3.8 L/min CI:  [1.6 L/min/m2-2 L/min/m2] 2 L/min/m2  Physical Exam:  Rhythm:   Sinus brady - AAI paced  Breath sounds: Few rhonchi  Heart sounds:  RRR w/out murmur  Incisions:  Dressing dry, intact  Abdomen:  Soft, non-distended, non-tender  Extremities:  Warm, well-perfused    Intake/Output from previous day: 07/30 0701 - 07/31 0700 In: 7610.1 [I.V.:4900.1; Blood:800; NG/GT:60; IV Piggyback:1850] Out: 6845 [Urine:4305; Blood:1750; Chest Tube:790] Intake/Output this shift:    Lab Results:  Recent Labs  07/23/13 0010 07/23/13 0015 07/23/13 0430  WBC 13.9*  --  12.9*  HGB 10.2* 9.9* 10.4*  HCT 30.6* 29.0* 30.5*  PLT 142*  --  154   BMET:  Recent Labs  07/23/13 0010 07/23/13 0015 07/23/13 0430  NA 137 139 137  K 4.1 3.8 4.4  CL 107 105 107  CO2 21  --  21  GLUCOSE 136* 131* 119*  BUN 21 20 20   CREATININE 1.13 1.40* 1.22  CALCIUM 7.1*  --  7.5*    CBG (last 3)   Recent Labs  07/22/13 2147 07/23/13 0046 07/23/13 0351  GLUCAP 97 112* 111*   ABG    Component Value Date/Time   PHART 7.415 07/23/2013 0436   HCO3 20.9 07/23/2013 0436   TCO2 22 07/23/2013 0436   ACIDBASEDEF 3.0* 07/23/2013 0436   O2SAT  94.0 07/23/2013 0436   CXR: *RADIOLOGY REPORT*  Clinical Data: Postoperative radiograph.Intubated patient. Assess  support apparatus.  PORTABLE CHEST - 1 VIEW  Comparison: 07/22/2013, 1703 hours.  Findings: Endotracheal tube is 33 mm from the carina. Right IJ  vascular sheath transmitting a Swan-Ganz catheter. The tip is in  the right pulmonary artery. There is also a right IJ catheter that  terminates in the mid to lower SVC. CABG/median sternotomy.  Mitral annuloplasty. Cardiopericardial silhouette is mildly  enlarged. Pulmonary vascular congestion is present. Mild basilar  pulmonary edema. Hazy opacity over the right base is compatible  with a posteriorly layering pleural effusion and associated  atelectasis. Left basilar atelectasis is present as well.  Mediastinal drains appear similar. Left atrial clip noted.  IMPRESSION:  1. Support apparatus in good position.  2. Shifting pleural effusions, basilar atelectasis and mild  basilar pulmonary edema.  Original Report Authenticated By: Andreas Newport, M.D.   Assessment/Plan: S/P Procedure(s) (LRB): MITRAL VALVE REPAIR (MVR) (N/A) CORONARY ARTERY BYPASS GRAFTING (CABG) (N/A) MAZE (N/A) INTRAOPERATIVE TRANSESOPHAGEAL ECHOCARDIOGRAM (N/A) PATENT FORAMEN OVALE CLOSURE (N/A)  Overall stable POD1 Maintaining sinus rhythm - AAI paced Stable hemodynamics on low dose dopamine and milrinone Vent not weaned overnight due to marginal oxygenation, but PaO2 >70 presently and CXR looks okay -  should tolerate acute vent wean and extubation Expected post op acute blood loss anemia, mild, stable Expected post op volume excess Chronic kidney disease w/ pre-op acute exacerbation, creatinine decreased today   Wean vent and extubate as tolerated  Mobilize post extubation  Begin diuresis w/ lasix  Wean drips slowly after extubation   Jonesha Tsuchiya H 07/23/2013 7:57 AM

## 2013-07-23 NOTE — Progress Notes (Signed)
TELEMETRY: Reviewed telemetry pt in atrially paced: Filed Vitals:   07/23/13 0400 07/23/13 0500 07/23/13 0600 07/23/13 0700  BP: 103/65 84/58 99/66  122/75  Pulse: 90 90 90 90  Temp: 100 F (37.8 C) 99.9 F (37.7 C) 99.9 F (37.7 C) 99.5 F (37.5 C)  TempSrc: Core (Comment)     Resp: 16 16 16 16   Height:      Weight:   189 lb 2.5 oz (85.8 kg)   SpO2: 94% 93% 94% 95%    Intake/Output Summary (Last 24 hours) at 07/23/13 0725 Last data filed at 07/23/13 0700  Gross per 24 hour  Intake 7610.08 ml  Output   6845 ml  Net 765.08 ml    SUBJECTIVE Awake, alert on ventilator.  LABS: Basic Metabolic Panel:  Recent Labs  16/10/96 0010 07/23/13 0015 07/23/13 0430  NA 137 139 137  K 4.1 3.8 4.4  CL 107 105 107  CO2 21  --  21  GLUCOSE 136* 131* 119*  BUN 21 20 20   CREATININE 1.13 1.40* 1.22  CALCIUM 7.1*  --  7.5*  MG 2.6*  --   --    Liver Function Tests:  Recent Labs  07/20/13 1010 07/20/13 1555  AST 32 28  ALT 41 38  ALKPHOS 100 100  BILITOT 0.8 0.8  PROT 6.9 6.7  ALBUMIN 3.7 3.6   No results found for this basename: LIPASE, AMYLASE,  in the last 72 hours CBC:  Recent Labs  07/20/13 1010 07/20/13 1555  07/23/13 0010 07/23/13 0015 07/23/13 0430  WBC 14.3* 12.1*  < > 13.9*  --  12.9*  NEUTROABS  --  9.1*  --   --   --   --   HGB 13.1 13.0  < > 10.2* 9.9* 10.4*  HCT 39.8 38.7*  < > 30.6* 29.0* 30.5*  MCV 97.3 97.0  < > 93.9  --  93.0  PLT 325 303  < > 142*  --  154  < > = values in this interval not displayed. Hemoglobin A1C:  Recent Labs  07/20/13 1010  HGBA1C 5.2   Radiology/Studies:  Dg Chest 2 View  07/20/2013   *RADIOLOGY REPORT*  Clinical Data: CHF versus pneumonia.  CHEST - 2 VIEW  Comparison: None.  Findings: Small bilateral pleural effusions, right slightly greater than left.  No focal airspace opacities or evidence of edema. Heart is borderline in size.  Mild peribronchial thickening.  No acute bony abnormality.  IMPRESSION: Small  bilateral pleural effusions.  Borderline cardiomegaly and bronchitic changes.   Original Report Authenticated By: Charlett Nose, M.D.   Dg Chest 2 View  07/09/2013   *RADIOLOGY REPORT*  Clinical Data: Follow up congestive heart failure.  CHEST - 2 VIEW  Comparison: 07/05/2013  Findings: Pulmonary edema has resolved.  There are minimal bilateral pleural effusions.  The lungs are clear.  The cardiac silhouette is mildly enlarged.  No mediastinal or hilar masses.  IMPRESSION: Resolved pulmonary edema.  Minimal bilateral pleural effusions.   Original Report Authenticated By: Amie Portland, M.D.   Dg Chest Portable 1 View In Am  07/23/2013   *RADIOLOGY REPORT*  Clinical Data: Postoperative radiograph.Intubated patient.  Assess support apparatus.  PORTABLE CHEST - 1 VIEW  Comparison: 07/22/2013, 1703 hours.  Findings: Endotracheal tube is 33 mm from the carina.  Right IJ vascular sheath transmitting a Swan-Ganz catheter.  The tip is in the right pulmonary artery.  There is also a right IJ catheter that terminates in the  mid to lower SVC.  CABG/median sternotomy. Mitral annuloplasty.  Cardiopericardial silhouette is mildly enlarged.  Pulmonary vascular congestion is present. Mild basilar pulmonary edema.  Hazy opacity over the right base is compatible with a posteriorly layering pleural effusion and associated atelectasis.  Left basilar atelectasis is present as well. Mediastinal drains appear similar. Left atrial clip noted.  IMPRESSION:  1.  Support apparatus in good position. 2.  Shifting pleural effusions, basilar atelectasis and mild basilar pulmonary edema.   Original Report Authenticated By: Andreas Newport, M.D.   Dg Chest Portable 1 View  07/22/2013   *RADIOLOGY REPORT*  Clinical Data: Status post CABG, mitral valve repair and Maze procedure.  PORTABLE CHEST - 1 VIEW  Comparison: 07/20/2013  Findings: An endotracheal tube is present with the tip within 1 cm of the carina.  A Swan-Ganz catheter is present with  the tip in the proximal right pulmonary artery.  Additional central line also present with the tip in the lower SVC.  A nasogastric tube extends below the diaphragm.  A right chest tube is present.  No pneumothorax is identified. The heart size and mediastinal contours are stable.  There is left lower lobe atelectasis.  No overt pulmonary edema is seen.  IMPRESSION: Left lower lobe atelectasis postoperatively.  No pneumothorax.  The endotracheal tube tip projects within 1 cm of the carina by x-ray.   Original Report Authenticated By: Irish Lack, M.D.    PHYSICAL EXAM General: Well developed, on vent, in no acute distress. Head: Normal Neck: Negative for carotid bruits. JVD not elevated. Lungs: Clear bilaterally to auscultation without wheezes, rales, or rhonchi. Breathing is unlabored. Heart: RRR S1 S2 without murmurs or gallops. Mediastinal rub. Abdomen: Soft, non-tender, non-distended with normoactive bowel sounds. No hepatomegaly. Msk:  Strength and tone appears normal for age. Extremities: No clubbing, cyanosis or edema.  Distal pedal pulses are 2+ and equal bilaterally. Neuro: Alert and oriented X 3. Moves all extremities spontaneously. Psych:  Responds to questions appropriately with a normal affect.  ASSESSMENT AND PLAN: Doing well post CABG, Maze, and MV repair. On dopamine, neo, and milrinone drips. Atrially paced. PA pressures improved. Good urine output. Anticipate weaning off vent today. Wean pressors as tolerated.  Principal Problem:   S/P mitral valve repair Active Problems:   Mitral valve prolapse   Mitral regurgitation   Atrial fibrillation   Essential hypertension, benign   Acute combined systolic and diastolic congestive heart failure   CAD (coronary artery disease)   CKD (chronic kidney disease), stage III   S/P CABG x 4   S/P Maze operation for atrial fibrillation    Signed, Akeen Ledyard [place] MD,FACC 07/23/2013 7:29 AM

## 2013-07-23 NOTE — Procedures (Signed)
Extubation Procedure Note  Patient Details:   Name: Henry Page DOB: 10/25/1947 MRN: [id number]   Airway Documentation:     Evaluation  O2 sats: stable throughout Complications: No apparent complications Patient did tolerate procedure well. Bilateral Breath Sounds: Clear Suctioning: Airway Yes,  Pt extubated to a 6lpm [place], Sp02 92%, HR 80, rr 31, able to vocalize.  Melanee Spry 07/23/2013, 9:12 AM

## 2013-07-23 NOTE — Progress Notes (Signed)
Patient ID: Henry Page, male   DOB: 10/25/1947, 67 y.o.   MRN: [id number]  SICU Evening Rounds:  Hemodynamically stable.  CI 1.86 on Milrinone 0.3 and dop 3. A-paced 90  PO2 only 66 on 80% FIO2. Still on vent but has awakened and reportedly dropped BP and sats when he woke up.  CT output ok Urine output ok CBC    Component Value Date/Time   WBC 13.9* 07/23/2013 0010   RBC 3.26* 07/23/2013 0010   HGB 9.9* 07/23/2013 0015   HCT 29.0* 07/23/2013 0015   PLT 142* 07/23/2013 0010   MCV 93.9 07/23/2013 0010   MCH 31.3 07/23/2013 0010   MCHC 33.3 07/23/2013 0010   RDW 15.4 07/23/2013 0010   LYMPHSABS 1.8 07/20/2013 1555   MONOABS 1.1* 07/20/2013 1555   EOSABS 0.1 07/20/2013 1555   BASOSABS 0.0 07/20/2013 1555    BMET    Component Value Date/Time   NA 139 07/23/2013 0015   K 3.8 07/23/2013 0015   CL 105 07/23/2013 0015   CO2 21 07/23/2013 0010   GLUCOSE 131* 07/23/2013 0015   BUN 20 07/23/2013 0015   CREATININE 1.40* 07/23/2013 0015   CALCIUM 7.1* 07/23/2013 0010   GFRNONAA 66* 07/23/2013 0010   GFRAA 76* 07/23/2013 0010    Wean FIO2 as tolerated.

## 2013-07-23 NOTE — Progress Notes (Signed)
INITIAL NUTRITION ASSESSMENT  DOCUMENTATION CODES Per approved criteria  -Not Applicable   INTERVENTION: If prolonged intubation expected, recommend initiation of nutrition support. If enteral nutrition warranted, recommend initiation of Osmolite 1.5 via enteral feeding tube at 20 ml/h, advance by 10 ml q 4 hr, to goal of 50 ml/h. Add 30 ml Prostat liquid protein via tube TID. Goal regimen will provide: 2100 kcal, 120 grams protein, 914 ml free water. Pt will benefit from oral nutrition supplements throughout diet advancement after extubation to promote healing. RD to follow along and make recommendations accordingly.  NUTRITION DIAGNOSIS: Inadequate oral intake related to inability to eat as evidenced by NPO status.   Goal: Intake to meet >90% of estimated nutrition needs.  Monitor:  weight trends, lab trends, I/O's, vent settings, initiation of diet vs EN  Reason for Assessment: VDRF  67 y.o. male  Admitting Dx: S/P mitral valve repair  ASSESSMENT: Admitted for acute on chronic CHF, planned surgery of MV repair and CABG.  Underwent the following on 7/30: INTRAOPERATIVE TRANSESOPHAGEAL ECHOCARDIOGRAM CORONARY ARTERY BYPASS GRAFTING (CABG) x 4 MITRAL VALVE REPAIR MAZE  Per most recent MD note, pt is doing well post-op. Anticipate weaning off of vent today. Pt with good urine output.  Patient is currently intubated on ventilator support.  MV: 10.9 L/min Temp:Temp (24hrs), Avg:98.3 F (36.8 C), Min:97 F (36.1 C), Max:100 F (37.8 C)  Propofol: none   Height: Ht Readings from Last 1 Encounters:  07/21/13 5' 10.08" (1.78 m)    Weight: Wt Readings from Last 1 Encounters:  07/23/13 189 lb 2.5 oz (85.8 kg)    Ideal Body Weight: 166 lb/75.5 kg  % Ideal Body Weight: 114%  Wt Readings from Last 10 Encounters:  07/23/13 189 lb 2.5 oz (85.8 kg)  07/23/13 189 lb 2.5 oz (85.8 kg)  07/20/13 178 lb (80.74 kg)  07/20/13 178 lb 11.2 oz (81.058 kg)  07/09/13 172 lb 9.6  oz (78.291 kg)  07/09/13 172 lb 9.6 oz (78.291 kg)  07/09/13 172 lb 9.6 oz (78.291 kg)    Usual Body Weight: 172 lb  % Usual Body Weight: 110%  BMI:  Body mass index is 27.08 kg/(m^2). Overweight.  Estimated Nutritional Needs: Kcal: 2022 Protein: 105 - 120 g Fluid: 1.8 - 2 liters  Skin: chest incision  Diet Order:    EDUCATION NEEDS: -No education needs identified at this time   Intake/Output Summary (Last 24 hours) at 07/23/13 0757 Last data filed at 07/23/13 0700  Gross per 24 hour  Intake 7610.08 ml  Output   6845 ml  Net 765.08 ml    Last BM: 7/28  Labs:   Recent Labs Lab 07/22/13 0525  07/23/13 0010 07/23/13 0015 07/23/13 0430  NA 138  < > 137 139 137  K 4.1  < > 4.1 3.8 4.4  CL 102  --  107 105 107  CO2 27  --  21  --  21  BUN 36*  --  21 20 20   CREATININE 1.77*  --  1.13 1.40* 1.22  CALCIUM 9.2  --  7.1*  --  7.5*  MG  --   --  2.6*  --   --   GLUCOSE 86  < > 136* 131* 119*  < > = values in this interval not displayed.  CBG (last 3)   Recent Labs  07/22/13 2147 07/23/13 0046 07/23/13 0351  GLUCAP 97 112* 111*    Scheduled Meds: . acetaminophen  1,000 mg Oral Q6H  Or  . acetaminophen (TYLENOL) oral liquid 160 mg/5 mL  1,000 mg Per Tube Q6H  . aspirin EC  325 mg Oral Daily   Or  . aspirin  324 mg Per Tube Daily  . bisacodyl  10 mg Oral Daily   Or  . bisacodyl  10 mg Rectal Daily  . cefUROXime (ZINACEF)  IV  1.5 g Intravenous Q12H  . docusate sodium  200 mg Oral Daily  . famotidine (PEPCID) IV  20 mg Intravenous Q12H  . insulin aspart  0-24 Units Subcutaneous Q4H  . metoprolol tartrate  12.5 mg Oral BID   Or  . metoprolol tartrate  12.5 mg Per Tube BID  . [START ON 07/24/2013] pantoprazole  40 mg Oral Daily  . sodium chloride  3 mL Intravenous Q12H    Continuous Infusions: . sodium chloride 20 mL/hr at 07/22/13 2300  . sodium chloride 20 mL/hr at 07/22/13 2200  . sodium chloride 250 mL (07/23/13 0641)  . dexmedetomidine 0.7  mcg/kg/hr (07/23/13 0700)  . DOPamine 3 mcg/kg/min (07/23/13 0700)  . lactated ringers 20 mL/hr (07/22/13 1630)  . milrinone 0.3 mcg/kg/min (07/23/13 0700)  . nitroGLYCERIN Stopped (07/22/13 1630)  . phenylephrine (NEO-SYNEPHRINE) Adult infusion 30 mcg/min (07/23/13 0700)    Past Medical History  Diagnosis Date  . Mitral valve prolapse 07/06/2013  . Mitral regurgitation 07/06/2013  . Atrial fibrillation 07/06/2013  . Essential hypertension, benign 07/06/2013  . 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 Heart murmur   . Sleep apnea   . Anxiety   . Shortness of breath   . Kidney stones     some passed spontaneous, one retrieved by cystoscope   . CKD (chronic kidney disease), stage III   . S/P mitral valve repair 07/22/2013    Complex valvuloplasty including triangular resection of posterior leaflet with artificial Gore-tex neocord placement x2 and 34mm Sorin Memo 3D ring annuloplasty  . 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 S/P Maze operation for atrial fibrillation 07/22/2013    Complete bilateral atrial lesion set using bipolar radiofrequency and cryothermy ablation with clipping of LA appendage     Past Surgical History  Procedure Laterality Date  . Tonsillectomy    . Tee without cardioversion N/A 07/08/2013    Procedure: TRANSESOPHAGEAL ECHOCARDIOGRAM (TEE);  Surgeon: Laurey Morale, MD;  Location: [facility] ENDOSCOPY;  Service: Cardiovascular;  Laterality: N/A;  . Cystoscopy     Jarold Motto MS, RD, LDN Pager: [phone] After-hours pager: [phone]

## 2013-07-23 NOTE — Anesthesia Postprocedure Evaluation (Signed)
Anesthesia Post Note  Patient: Henry Page  Procedure(s) Performed: Procedure(s) (LRB): MITRAL VALVE REPAIR (MVR) (N/A) CORONARY ARTERY BYPASS GRAFTING (CABG) (N/A) MAZE (N/A) INTRAOPERATIVE TRANSESOPHAGEAL ECHOCARDIOGRAM (N/A) PATENT FORAMEN OVALE CLOSURE (N/A)  Anesthesia type: General  Patient location: ICU  Post pain: Pain level controlled  Post assessment: Post-op Vital signs reviewed  Last Vitals:  Filed Vitals:   07/23/13 1230  BP: 88/62  Pulse: 79  Temp:   Resp: 25    Post vital signs: stable  Level of consciousness: Patient remains intubated per anesthesia plan  Complications: No apparent anesthesia complications

## 2013-07-23 NOTE — Progress Notes (Signed)
Patient examined and record reviewed.Hemodynamics stable,labs satisfactory.Patient had stable day.Continue current care. VAN TRIGT III,Henry Page 07/23/2013   

## 2013-07-24 ENCOUNTER — Inpatient Hospital Stay (HOSPITAL_COMMUNITY): Payer: BC Managed Care – PPO

## 2013-07-24 ENCOUNTER — Encounter (HOSPITAL_COMMUNITY): Payer: Self-pay | Admitting: Thoracic Surgery (Cardiothoracic Vascular Surgery)

## 2013-07-24 LAB — CBC
Hemoglobin: 8.7 g/dL — ABNORMAL LOW (ref 13.0–17.0)
MCH: 31.6 pg (ref 26.0–34.0)
MCV: 92.4 fL (ref 78.0–100.0)
RBC: 2.75 MIL/uL — ABNORMAL LOW (ref 4.22–5.81)

## 2013-07-24 LAB — BASIC METABOLIC PANEL
CO2: 23 mEq/L (ref 19–32)
Glucose, Bld: 113 mg/dL — ABNORMAL HIGH (ref 70–99)
Potassium: 3.7 mEq/L (ref 3.5–5.1)
Sodium: 132 mEq/L — ABNORMAL LOW (ref 135–145)

## 2013-07-24 LAB — GLUCOSE, CAPILLARY: Glucose-Capillary: 111 mg/dL — ABNORMAL HIGH (ref 70–99)

## 2013-07-24 MED ORDER — TRAMADOL HCL 50 MG PO TABS
50.0000 mg | ORAL_TABLET | Freq: Four times a day (QID) | ORAL | Status: DC | PRN
  Administered 2013-07-24 – 2013-07-28 (×2): 50 mg via ORAL
  Filled 2013-07-24 (×2): qty 1

## 2013-07-24 MED ORDER — AMIODARONE HCL 200 MG PO TABS
200.0000 mg | ORAL_TABLET | Freq: Two times a day (BID) | ORAL | Status: DC
  Administered 2013-07-24: 200 mg via ORAL
  Filled 2013-07-24 (×3): qty 1

## 2013-07-24 MED ORDER — WARFARIN SODIUM 2.5 MG PO TABS
2.5000 mg | ORAL_TABLET | Freq: Every day | ORAL | Status: DC
  Administered 2013-07-24 – 2013-07-26 (×3): 2.5 mg via ORAL
  Filled 2013-07-24 (×4): qty 1

## 2013-07-24 MED ORDER — ALPRAZOLAM 0.25 MG PO TABS: 0.2500 mg | ORAL_TABLET | Freq: Two times a day (BID) | ORAL | Status: DC | PRN

## 2013-07-24 MED ORDER — ASPIRIN EC 81 MG PO TBEC
81.0000 mg | DELAYED_RELEASE_TABLET | Freq: Every day | ORAL | Status: DC
  Administered 2013-07-24 – 2013-07-31 (×8): 81 mg via ORAL
  Filled 2013-07-24 (×8): qty 1

## 2013-07-24 MED ORDER — WARFARIN - PHYSICIAN DOSING INPATIENT
Freq: Every day | Status: DC
  Administered 2013-07-24 – 2013-07-29 (×4)

## 2013-07-24 MED ORDER — MILRINONE IN DEXTROSE 20 MG/100ML IV SOLN: 0.0000 ug/kg/min | INTRAVENOUS | Status: DC

## 2013-07-24 MED ORDER — ATORVASTATIN CALCIUM 40 MG PO TABS
40.0000 mg | ORAL_TABLET | Freq: Every day | ORAL | Status: DC
Start: 2013-07-24 — End: 2013-07-31
  Administered 2013-07-24 – 2013-07-30 (×7): 40 mg via ORAL
  Filled 2013-07-24 (×8): qty 1

## 2013-07-24 MED ORDER — POTASSIUM CHLORIDE CRYS ER 10 MEQ PO TBCR
10.0000 meq | EXTENDED_RELEASE_TABLET | Freq: Two times a day (BID) | ORAL | Status: DC
  Administered 2013-07-24: 10 meq via ORAL
  Filled 2013-07-24 (×4): qty 1

## 2013-07-24 MED ORDER — MORPHINE SULFATE 2 MG/ML IJ SOLN
2.0000 mg | INTRAMUSCULAR | Status: DC | PRN
  Administered 2013-07-25: 2 mg via INTRAVENOUS
  Filled 2013-07-24: qty 1

## 2013-07-24 MED ORDER — POTASSIUM CHLORIDE CRYS ER 20 MEQ PO TBCR
EXTENDED_RELEASE_TABLET | ORAL | Status: AC
  Administered 2013-07-24: 10 meq via ORAL
  Filled 2013-07-24: qty 1

## 2013-07-24 MED ORDER — FUROSEMIDE 10 MG/ML IJ SOLN
40.0000 mg | Freq: Two times a day (BID) | INTRAMUSCULAR | Status: DC
  Administered 2013-07-24 (×2): 40 mg via INTRAVENOUS
  Filled 2013-07-24 (×2): qty 4

## 2013-07-24 NOTE — Progress Notes (Signed)
Patient ID: Henry Page, male   DOB: 10/25/1947, 67 y.o.   MRN: [id number]  SICU Evening Rounds:  Hemodynamically stable off drips.  A-paced 80  Feels better. Breathing easier.  BMET    Component Value Date/Time   NA 132* 07/24/2013 0415   K 3.7 07/24/2013 0415   CL 98 07/24/2013 0415   CO2 23 07/24/2013 0415   GLUCOSE 113* 07/24/2013 0415   BUN 27* 07/24/2013 0415   CREATININE 1.79* 07/24/2013 0415   CALCIUM 8.6 07/24/2013 0415   GFRNONAA 38* 07/24/2013 0415   GFRAA 44* 07/24/2013 0415    CBC    Component Value Date/Time   WBC 16.6* 07/24/2013 0415   RBC 2.75* 07/24/2013 0415   HGB 8.7* 07/24/2013 0415   HCT 25.4* 07/24/2013 0415   PLT 134* 07/24/2013 0415   MCV 92.4 07/24/2013 0415   MCH 31.6 07/24/2013 0415   MCHC 34.3 07/24/2013 0415   RDW 15.4 07/24/2013 0415   LYMPHSABS 1.8 07/20/2013 1555   MONOABS 1.1* 07/20/2013 1555   EOSABS 0.1 07/20/2013 1555   BASOSABS 0.0 07/20/2013 1555    Urine output 20/hr. Creat up some. Will hold off on further diuresis for now until creat stabilizes.

## 2013-07-24 NOTE — Progress Notes (Addendum)
TCTS DAILY ICU PROGRESS NOTE                   [address]            [name] [address]          [phone]   2 Days Post-Op Procedure(s) (LRB): MITRAL VALVE REPAIR (MVR) (N/A) CORONARY ARTERY BYPASS GRAFTING (CABG) (N/A) MAZE (N/A) INTRAOPERATIVE TRANSESOPHAGEAL ECHOCARDIOGRAM (N/A) PATENT FORAMEN OVALE CLOSURE (N/A)  Total Length of Stay:  LOS: 4 days   Subjective: Some discomfort, but mild.   Objective: Vital signs in last 24 hours: Temp:  [97 F (36.1 C)-99.5 F (37.5 C)] 98 F (36.7 C) (08/01 0400) Pulse Rate:  [77-130] 79 (08/01 0800) Cardiac Rhythm:  [-] Atrial paced (08/01 0400) Resp:  [10-32] 18 (08/01 0800) BP: (70-138)/(26-113) 104/59 mmHg (08/01 0800) SpO2:  [86 %-98 %] 95 % (08/01 0800) Arterial Line BP: (80-152)/(42-66) 112/47 mmHg (08/01 0800) FiO2 (%):  [40 %-50 %] 50 % (07/31 1200) Weight:  [182 lb 15.7 oz (83 kg)] 182 lb 15.7 oz (83 kg) (08/01 0500)  Filed Weights   07/22/13 0537 07/23/13 0600 07/24/13 0500  Weight: 169 lb 9.6 oz (76.93 kg) 189 lb 2.5 oz (85.8 kg) 182 lb 15.7 oz (83 kg)    Weight change: -6 lb 2.8 oz (-2.8 kg)   Hemodynamic parameters for last 24 hours: PAP: (36-44)/(15-21) 44/15 mmHg  Intake/Output from previous day: 07/31 0701 - 08/01 0700 In: 2675.6 [I.V.:2045.6; NG/GT:30; IV Piggyback:600] Out: 3470 [Urine:2620; Emesis/NG output:300; Chest Tube:550]  Intake/Output this shift: Total I/O In: 100 [IV Piggyback:100] Out: 55 [Urine:35; Chest Tube:20]  Current Meds: Scheduled Meds: . acetaminophen  1,000 mg Oral Q6H  . aspirin EC  81 mg Oral Daily  . atorvastatin  40 mg Oral q1800  . bisacodyl  10 mg Oral Daily   Or  . bisacodyl  10 mg Rectal Daily  . cefUROXime (ZINACEF)  IV  1.5 g Intravenous Q12H  . docusate sodium  200 mg Oral Daily  . insulin aspart  0-24 Units Subcutaneous Q4H  . metoprolol tartrate  12.5 mg Oral BID  . pantoprazole  40 mg Oral Daily  . sodium chloride  3 mL Intravenous Q12H   . warfarin  2.5 mg Oral q1800  . Warfarin - Physician Dosing Inpatient   Does not apply q1800   Continuous Infusions: . sodium chloride 250 mL (07/23/13 0641)  . amiodarone (NEXTERONE PREMIX) 360 mg/200 mL dextrose 30 mg/hr (07/24/13 0700)  . DOPamine 3 mcg/kg/min (07/23/13 1900)  . milrinone    . phenylephrine (NEO-SYNEPHRINE) Adult infusion 40 mcg/min (07/24/13 0800)   PRN Meds:.ALPRAZolam, metoCLOPramide (REGLAN) injection, metoprolol, morphine injection, ondansetron (ZOFRAN) IV, oxyCODONE, potassium chloride, promethazine, sodium chloride  General appearance: alert, cooperative and no distress Heart: regular rate and rhythm Lungs: mildly dim in bases Abdomen: soft, nontender, mild dist Extremities: minor edema Wound: incis dressed  Lab Results: CBC: Recent Labs  07/23/13 1500 07/23/13 1550 07/24/13 0415  WBC 17.7*  --  16.6*  HGB 9.7* 9.9* 8.7*  HCT 29.4* 29.0* 25.4*  PLT 146*  --  134*   BMET:  Recent Labs  07/23/13 0430  07/23/13 1550 07/24/13 0415  NA 137  --  136 132*  K 4.4  --  3.9 3.7  CL 107  --  101 98  CO2 21  --   --  23  GLUCOSE 119*  --  156* 113*  BUN 20  --  19 27*  CREATININE 1.22  < > 1.40* 1.79*  CALCIUM 7.5*  --   --  8.6  < > = values in this interval not displayed.  PT/INR:  Recent Labs  07/22/13 1700  LABPROT 18.5*  INR 1.59*   ABG    Component Value Date/Time   PHART 7.409 07/23/2013 1018   PCO2ART 35.0 07/23/2013 1018   PO2ART 64.0* 07/23/2013 1018   HCO3 22.1 07/23/2013 1018   TCO2 20 07/23/2013 1550   ACIDBASEDEF 2.0 07/23/2013 1018   O2SAT 92.0 07/23/2013 1018       Radiology: Dg Chest Port 1 View  07/24/2013   *RADIOLOGY REPORT*  Clinical Data: Mitral valve repair.  PORTABLE CHEST - 1 VIEW  Comparison: 07/23/2013.  Findings: Swan-Ganz catheter removed.  Right IJ vascular sheath and right IJ central line remain unchanged.  Bilateral thoracostomy tubes and mediastinal drain remain present.  Mitral annuloplasty and left atrial  clip.  CABG/median sternotomy.  Basilar atelectasis is present along with interstitial and mild alveolar pulmonary edema. Monitoring leads are projected over the chest.  There is no pneumothorax.  Cardiomegaly and mediastinal contours appears similar.  The endotracheal tube and enteric tubes have been removed.  IMPRESSION:  1.  Interval extubation and removal of enteric tube. 2.  Removal of Swan-Ganz catheter. 3.  Mild persistent pulmonary edema and basilar atelectasis. 4.  Postoperative changes mitral annuloplasty and CABG.  Unchanged cardiomegaly.   Original Report Authenticated By: Andreas Newport, M.D.   Dg Chest Portable 1 View In Am  07/23/2013   *RADIOLOGY REPORT*  Clinical Data: Postoperative radiograph.Intubated patient.  Assess support apparatus.  PORTABLE CHEST - 1 VIEW  Comparison: 07/22/2013, 1703 hours.  Findings: Endotracheal tube is 33 mm from the carina.  Right IJ vascular sheath transmitting a Swan-Ganz catheter.  The tip is in the right pulmonary artery.  There is also a right IJ catheter that terminates in the mid to lower SVC.  CABG/median sternotomy. Mitral annuloplasty.  Cardiopericardial silhouette is mildly enlarged.  Pulmonary vascular congestion is present. Mild basilar pulmonary edema.  Hazy opacity over the right base is compatible with a posteriorly layering pleural effusion and associated atelectasis.  Left basilar atelectasis is present as well. Mediastinal drains appear similar. Left atrial clip noted.  IMPRESSION:  1.  Support apparatus in good position. 2.  Shifting pleural effusions, basilar atelectasis and mild basilar pulmonary edema.   Original Report Authenticated By: Andreas Newport, M.D.   Dg Chest Portable 1 View  07/22/2013   *RADIOLOGY REPORT*  Clinical Data: Status post CABG, mitral valve repair and Maze procedure.  PORTABLE CHEST - 1 VIEW  Comparison: 07/20/2013  Findings: An endotracheal tube is present with the tip within 1 cm of the carina.  A Swan-Ganz catheter  is present with the tip in the proximal right pulmonary artery.  Additional central line also present with the tip in the lower SVC.  A nasogastric tube extends below the diaphragm.  A right chest tube is present.  No pneumothorax is identified. The heart size and mediastinal contours are stable.  There is left lower lobe atelectasis.  No overt pulmonary edema is seen.  IMPRESSION: Left lower lobe atelectasis postoperatively.  No pneumothorax.  The endotracheal tube tip projects within 1 cm of the carina by x-ray.   Original Report Authenticated By: Irish Lack, M.D.     Assessment/Plan: S/P Procedure(s) (LRB): MITRAL VALVE REPAIR (MVR) (N/A) CORONARY ARTERY BYPASS GRAFTING (CABG) (N/A) MAZE (N/A) INTRAOPERATIVE TRANSESOPHAGEAL ECHOCARDIOGRAM (N/A) PATENT  FORAMEN OVALE CLOSURE (N/A)  1 stable overall 2 wean neo to off as able 3 mostly in sinus rhythm sometimes AAI paced, junct at times. On amio gtt- monitor 4 volume overload and pulm edema- cont low dose dop/lasix gtt off- cont to give bolus. Watch creat/K+ 5 H/H down about a unit from yest- monitor 6 CBG's ok 7 CT's - 550 yesterday, cont for now, poss d/c later today 8 rehab/pulm toilet as able GOLD,WAYNE E 07/24/2013 8:14 AM   Creat starting to climb DC MT's, a-line and ambulate patient examined and medical record reviewed,agree with above note. VAN TRIGT III,Dorsie Burich 07/24/2013

## 2013-07-25 ENCOUNTER — Inpatient Hospital Stay (HOSPITAL_COMMUNITY): Payer: BC Managed Care – PPO

## 2013-07-25 LAB — COMPREHENSIVE METABOLIC PANEL
ALT: 26 U/L (ref 0–53)
AST: 52 U/L — ABNORMAL HIGH (ref 0–37)
Albumin: 2.6 g/dL — ABNORMAL LOW (ref 3.5–5.2)
Alkaline Phosphatase: 91 U/L (ref 39–117)
BUN: 34 mg/dL — ABNORMAL HIGH (ref 6–23)
CO2: 23 mEq/L (ref 19–32)
Calcium: 8.4 mg/dL (ref 8.4–10.5)
Chloride: 98 mEq/L (ref 96–112)
Creatinine, Ser: 2.12 mg/dL — ABNORMAL HIGH (ref 0.50–1.35)
GFR calc Af Amer: 36 mL/min — ABNORMAL LOW (ref >90)
GFR calc non Af Amer: 31 mL/min — ABNORMAL LOW (ref >90)
Glucose, Bld: 95 mg/dL (ref 70–99)
Potassium: 4.6 mEq/L (ref 3.5–5.1)
Sodium: 130 mEq/L — ABNORMAL LOW (ref 135–145)
Total Bilirubin: 0.5 mg/dL (ref 0.3–1.2)
Total Protein: 5.1 g/dL — ABNORMAL LOW (ref 6.0–8.3)

## 2013-07-25 LAB — CBC
HCT: 23.4 % — ABNORMAL LOW (ref 39.0–52.0)
Hemoglobin: 8 g/dL — ABNORMAL LOW (ref 13.0–17.0)
MCH: 31.6 pg (ref 26.0–34.0)
MCHC: 34.2 g/dL (ref 30.0–36.0)
MCV: 92.5 fL (ref 78.0–100.0)
Platelets: 84 10*3/uL — ABNORMAL LOW (ref 150–400)
RBC: 2.53 MIL/uL — ABNORMAL LOW (ref 4.22–5.81)
RDW: 15.3 % (ref 11.5–15.5)
WBC: 7.6 10*3/uL (ref 4.0–10.5)

## 2013-07-25 LAB — GLUCOSE, CAPILLARY
Glucose-Capillary: 100 mg/dL — ABNORMAL HIGH (ref 70–99)
Glucose-Capillary: 86 mg/dL (ref 70–99)

## 2013-07-25 LAB — PROTIME-INR
INR: 1.23 (ref 0.00–1.49)
Prothrombin Time: 15.2 seconds (ref 11.6–15.2)

## 2013-07-25 NOTE — Progress Notes (Signed)
Pt up to chair this am and reported "dizzy feeling" upon sitting.  Pt maintained LOC although became sluggish.  VSS.  Pt recovered within 2 minutes with O2 increased to 6L via [place].  Pt returned to bed @ 1300.  Similar incident occurred upon returning to bed with recovery in less than 1 minute.  Pt requested to sit on edge of bed for dinner and declined to ambulate stating "I feel too weak." Samba Cumba, [facility]

## 2013-07-25 NOTE — Progress Notes (Signed)
PT Cancellation Note  Patient Details Name: Henry Page MRN: [id number] DOB: 10/25/1947   Cancelled Treatment:    Reason Eval/Treat Not Completed: Medical issues which prohibited therapy (Pt to lie flat x 1 hour)   Noble Bodie 07/25/2013, 4:04 PM

## 2013-07-25 NOTE — Progress Notes (Signed)
3 Days Post-Op Procedure(s) (LRB): MITRAL VALVE REPAIR (MVR) (N/A) CORONARY ARTERY BYPASS GRAFTING (CABG) (N/A) MAZE (N/A) INTRAOPERATIVE TRANSESOPHAGEAL ECHOCARDIOGRAM (N/A) PATENT FORAMEN OVALE CLOSURE (N/A) Subjective:  No specific complaints  Objective: Vital signs in last 24 hours: Temp:  [97.5 F (36.4 C)-98.4 F (36.9 C)] 98.4 F (36.9 C) (08/02 0804) Pulse Rate:  [46-80] 80 (08/02 0900) Cardiac Rhythm:  [-] Atrial paced (08/02 0751) Resp:  [11-27] 20 (08/02 0900) BP: (77-122)/(48-76) 122/76 mmHg (08/02 0900) SpO2:  [92 %-99 %] 97 % (08/02 0900) Arterial Line BP: (82-122)/(38-57) 103/53 mmHg (08/01 2100) Weight:  [81.7 kg (180 lb 1.9 oz)] 81.7 kg (180 lb 1.9 oz) (08/02 0500)  Hemodynamic parameters for last 24 hours:    Intake/Output from previous day: 08/01 0701 - 08/02 0700 In: 2159.7 [P.O.:1500; I.V.:559.7; IV Piggyback:100] Out: 1215 [Urine:955; Chest Tube:260] Intake/Output this shift: Total I/O In: 40 [I.V.:40] Out: 185 [Urine:110; Chest Tube:75]  General appearance: alert and cooperative Heart: regular rate and rhythm, S1, S2 normal, no murmur, click, rub or gallop Lungs: rhonchi right lung Extremities: edema mild Wound: incision ok  Lab Results:  Recent Labs  07/24/13 0415 07/25/13 0400  WBC 16.6* 7.6  HGB 8.7* 8.0*  HCT 25.4* 23.4*  PLT 134* 84*   BMET:  Recent Labs  07/24/13 0415 07/25/13 0400  NA 132* 130*  K 3.7 4.6  CL 98 98  CO2 23 23  GLUCOSE 113* 95  BUN 27* 34*  CREATININE 1.79* 2.12*  CALCIUM 8.6 8.4    PT/INR:  Recent Labs  07/25/13 0400  LABPROT 15.2  INR 1.23   ABG    Component Value Date/Time   PHART 7.409 07/23/2013 1018   HCO3 22.1 07/23/2013 1018   TCO2 20 07/23/2013 1550   ACIDBASEDEF 2.0 07/23/2013 1018   O2SAT 92.0 07/23/2013 1018   CBG (last 3)   Recent Labs  07/24/13 1535 07/24/13 1916 07/25/13 0755  GLUCAP 104* 110* 79   CXR:  Bibasilar atelectasis. Mild interstitial  edema Assessment/Plan: S/P Procedure(s) (LRB): MITRAL VALVE REPAIR (MVR) (N/A) CORONARY ARTERY BYPASS GRAFTING (CABG) (N/A) MAZE (N/A) INTRAOPERATIVE TRANSESOPHAGEAL ECHOCARDIOGRAM (N/A) PATENT FORAMEN OVALE CLOSURE (N/A) Mobilize Continue IS Acute renal failure: will hold off on further diuretic for now and observe. Urine output is adequate. Wt is still 6 lbs over preop.Keep foley in for now to follow urine output.  Rhythm is marked sinus brady 30's with long PR interval. Will stop amio and observe following MAZE.  Continue coumadin  DC chest tubes and sleeve.   LOS: 5 days    Jaci Desanto K 07/25/2013

## 2013-07-25 NOTE — Progress Notes (Signed)
Patient ID: Henry Page, male   DOB: 10/25/1947, 67 y.o.   MRN: [id number]  SICU Evening Rounds:  Hemodynamically stable  Good urine output  OOB to chair today but has not walked  Breathing comfortably.

## 2013-07-26 ENCOUNTER — Inpatient Hospital Stay (HOSPITAL_COMMUNITY): Payer: BC Managed Care – PPO

## 2013-07-26 LAB — GLUCOSE, CAPILLARY

## 2013-07-26 LAB — BASIC METABOLIC PANEL
BUN: 27 mg/dL — ABNORMAL HIGH (ref 6–23)
Calcium: 8.1 mg/dL — ABNORMAL LOW (ref 8.4–10.5)
GFR calc Af Amer: 55 mL/min — ABNORMAL LOW (ref >90)
GFR calc non Af Amer: 48 mL/min — ABNORMAL LOW (ref >90)
Potassium: 4.1 mEq/L (ref 3.5–5.1)

## 2013-07-26 LAB — PROTIME-INR
INR: 1.17 (ref 0.00–1.49)
Prothrombin Time: 14.7 seconds (ref 11.6–15.2)

## 2013-07-26 LAB — CBC
HCT: 22.9 % — ABNORMAL LOW (ref 39.0–52.0)
MCH: 31.7 pg (ref 26.0–34.0)
MCHC: 34.1 g/dL (ref 30.0–36.0)
RDW: 15.4 % (ref 11.5–15.5)

## 2013-07-26 MED ORDER — POTASSIUM CHLORIDE CRYS ER 20 MEQ PO TBCR
40.0000 meq | EXTENDED_RELEASE_TABLET | Freq: Once | ORAL | Status: AC
  Administered 2013-07-26: 40 meq via ORAL
  Filled 2013-07-26: qty 2

## 2013-07-26 MED ORDER — FUROSEMIDE 10 MG/ML IJ SOLN
80.0000 mg | Freq: Once | INTRAMUSCULAR | Status: AC
  Administered 2013-07-26: 80 mg via INTRAVENOUS
  Filled 2013-07-26: qty 8

## 2013-07-26 NOTE — Progress Notes (Signed)
4 Days Post-Op Procedure(s) (LRB): MITRAL VALVE REPAIR (MVR) (N/A) CORONARY ARTERY BYPASS GRAFTING (CABG) (N/A) MAZE (N/A) INTRAOPERATIVE TRANSESOPHAGEAL ECHOCARDIOGRAM (N/A) PATENT FORAMEN OVALE CLOSURE (N/A) Subjective: No complaints  Objective: Vital signs in last 24 hours: Temp:  [97.7 F (36.5 C)-98.4 F (36.9 C)] 98.2 F (36.8 C) (08/03 0846) Pulse Rate:  [76-125] 79 (08/03 1100) Cardiac Rhythm:  [-] Atrial paced (08/03 1000)  Sinus 60 under pacer  Resp:  [11-22] 15 (08/03 1100) BP: (115-148)/(61-103) 138/74 mmHg (08/03 1100) SpO2:  [78 %-100 %] 100 % (08/03 1100) Weight:  [83.19 kg (183 lb 6.4 oz)] 83.19 kg (183 lb 6.4 oz) (08/03 0500)  Hemodynamic parameters for last 24 hours:    Intake/Output from previous day: 08/02 0701 - 08/03 0700 In: 1180 [P.O.:720; I.V.:460] Out: 2240 [Urine:2055; Chest Tube:185] Intake/Output this shift: Total I/O In: 80 [I.V.:80] Out: 385 [Urine:385]  General appearance: alert and cooperative Heart: regular rate and rhythm, S1, S2 normal, no murmur, click, rub or gallop Lungs: clear to auscultation bilaterally Extremities: edema mild Wound: incision ok  Lab Results:  Recent Labs  07/25/13 0400 07/26/13 0415  WBC 7.6 7.2  HGB 8.0* 7.8*  HCT 23.4* 22.9*  PLT 84* 95*   BMET:  Recent Labs  07/25/13 0400 07/26/13 0415  NA 130* 132*  K 4.6 4.1  CL 98 101  CO2 23 25  GLUCOSE 95 105*  BUN 34* 27*  CREATININE 2.12* 1.48*  CALCIUM 8.4 8.1*    PT/INR:  Recent Labs  07/26/13 0415  LABPROT 14.7  INR 1.17   ABG    Component Value Date/Time   PHART 7.409 07/23/2013 1018   HCO3 22.1 07/23/2013 1018   TCO2 20 07/23/2013 1550   ACIDBASEDEF 2.0 07/23/2013 1018   O2SAT 92.0 07/23/2013 1018   CBG (last 3)   Recent Labs  07/25/13 1606 07/25/13 1953 07/26/13 0833  GLUCAP 86 82 77   *RADIOLOGY REPORT*  Clinical Data: Status post mitral repair, maze  PORTABLE CHEST - 1 VIEW  Comparison: Prior radiograph from prior  07/25/2013  Findings: Cardiomegaly is stable. Median sternotomy wires are  intact. Mitral annuloplasty is again noted. Left atrial appendage  clip is noted. There has been interval removal of right IJ Cordis  catheter. Additional right IJ central venous catheter is in stable  position with tip overlying the mid SVC. Telemetry leads overlie  the chest.  Overall, the appearance of the lungs is not significantly changed  as compared to the most recent examination. Mild bibasilar  atelectasis persist, left greater than right. No overt pulmonary  edema. There is a small left pleural effusion.  Osseous structures are unchanged.  IMPRESSION:  Stable appearance of the chest with small left pleural effusion and  bibasilar atelectasis, left greater than right. No overt pulmonary  edema  Original Report Authenticated By: Rise Mu, M.D.   Assessment/Plan: S/P Procedure(s) (LRB): MITRAL VALVE REPAIR (MVR) (N/A) CORONARY ARTERY BYPASS GRAFTING (CABG) (N/A) MAZE (N/A) INTRAOPERATIVE TRANSESOPHAGEAL ECHOCARDIOGRAM (N/A) PATENT FORAMEN OVALE CLOSURE (N/A)  He is hemodynamically stable in sinus rhythm 60. Amio stopped yesterday due to marked sinus brady in 30's.   He walked a little this am but got weak and dizzy and had to stop. He has expected acute blood loss anemia that has trended down to Hgb 7.8 so I think transfusion may help him mobilize. He desats into 70's off oxygen. He still has 9 lbs over preop wt so continue diuresis.  Renal function improving.   LOS: 6  days    Alleen Borne 07/26/2013

## 2013-07-27 LAB — CBC
MCHC: 33.9 g/dL (ref 30.0–36.0)
RDW: 15.7 % — ABNORMAL HIGH (ref 11.5–15.5)
WBC: 7.4 10*3/uL (ref 4.0–10.5)

## 2013-07-27 LAB — BASIC METABOLIC PANEL
Chloride: 99 mEq/L (ref 96–112)
GFR calc Af Amer: 66 mL/min — ABNORMAL LOW (ref >90)
GFR calc non Af Amer: 57 mL/min — ABNORMAL LOW (ref >90)
Potassium: 4 mEq/L (ref 3.5–5.1)
Sodium: 135 mEq/L (ref 135–145)

## 2013-07-27 LAB — TYPE AND SCREEN
Antibody Screen: NEGATIVE
Unit division: 0

## 2013-07-27 LAB — PROTIME-INR
INR: 1.12 (ref 0.00–1.49)
Prothrombin Time: 14.2 seconds (ref 11.6–15.2)

## 2013-07-27 MED ORDER — SODIUM CHLORIDE 0.9 % IJ SOLN
10.0000 mL | Freq: Two times a day (BID) | INTRAMUSCULAR | Status: DC
  Administered 2013-07-27: 10 mL

## 2013-07-27 MED ORDER — ALUM & MAG HYDROXIDE-SIMETH 200-200-20 MG/5ML PO SUSP
15.0000 mL | ORAL | Status: DC | PRN
  Administered 2013-07-29: 30 mL via ORAL
  Filled 2013-07-27: qty 30

## 2013-07-27 MED ORDER — POTASSIUM CHLORIDE CRYS ER 20 MEQ PO TBCR
20.0000 meq | EXTENDED_RELEASE_TABLET | Freq: Every day | ORAL | Status: DC
  Administered 2013-07-27 – 2013-07-28 (×2): 20 meq via ORAL
  Filled 2013-07-27 (×3): qty 1

## 2013-07-27 MED ORDER — SODIUM CHLORIDE 0.9 % IJ SOLN
3.0000 mL | Freq: Two times a day (BID) | INTRAMUSCULAR | Status: DC
  Administered 2013-07-27 – 2013-07-30 (×4): 3 mL via INTRAVENOUS

## 2013-07-27 MED ORDER — AZELASTINE HCL 0.1 % NA SOLN
1.0000 | Freq: Every day | NASAL | Status: DC | PRN
  Filled 2013-07-27: qty 30

## 2013-07-27 MED ORDER — SODIUM CHLORIDE 0.9 % IV SOLN: 250.0000 mL | INTRAVENOUS | Status: DC | PRN

## 2013-07-27 MED ORDER — SODIUM CHLORIDE 0.9 % IJ SOLN: 3.0000 mL | INTRAMUSCULAR | Status: DC | PRN

## 2013-07-27 MED ORDER — WARFARIN SODIUM 5 MG PO TABS
5.0000 mg | ORAL_TABLET | Freq: Every day | ORAL | Status: DC
  Administered 2013-07-27 – 2013-07-30 (×4): 5 mg via ORAL
  Filled 2013-07-27 (×5): qty 1

## 2013-07-27 MED ORDER — MAGNESIUM HYDROXIDE 400 MG/5ML PO SUSP
30.0000 mL | Freq: Every day | ORAL | Status: DC | PRN
  Administered 2013-07-28: 30 mL via ORAL
  Filled 2013-07-27: qty 30

## 2013-07-27 MED ORDER — ZOLPIDEM TARTRATE 5 MG PO TABS: 5.0000 mg | ORAL_TABLET | Freq: Every evening | ORAL | Status: DC | PRN

## 2013-07-27 MED ORDER — LACTULOSE 10 GM/15ML PO SOLN: 20.0000 g | Freq: Every day | ORAL | Status: DC | PRN

## 2013-07-27 MED ORDER — SODIUM CHLORIDE 0.9 % IJ SOLN: 10.0000 mL | INTRAMUSCULAR | Status: DC | PRN

## 2013-07-27 MED ORDER — FLUOCINONIDE 0.05 % EX CREA
TOPICAL_CREAM | Freq: Two times a day (BID) | CUTANEOUS | Status: DC | PRN
  Filled 2013-07-27: qty 30

## 2013-07-27 MED ORDER — FUROSEMIDE 40 MG PO TABS
40.0000 mg | ORAL_TABLET | Freq: Every day | ORAL | Status: DC
  Administered 2013-07-27 – 2013-07-29 (×3): 40 mg via ORAL
  Filled 2013-07-27 (×4): qty 1

## 2013-07-27 MED ORDER — MOVING RIGHT ALONG BOOK
Freq: Once | Status: AC
  Administered 2013-07-27: 12:00:00
  Filled 2013-07-27: qty 1

## 2013-07-27 NOTE — Progress Notes (Signed)
Dr Laneta Simmers notified of patient BP increasing throughout the day to 140-160/70's, no new orders received.

## 2013-07-27 NOTE — Progress Notes (Addendum)
      [address]       [place] [address]             [phone]      5 Days Post-Op Procedure(s) (LRB): MITRAL VALVE REPAIR (MVR) (N/A) CORONARY ARTERY BYPASS GRAFTING (CABG) (N/A) MAZE (N/A) INTRAOPERATIVE TRANSESOPHAGEAL ECHOCARDIOGRAM (N/A) PATENT FORAMEN OVALE CLOSURE (N/A)  Subjective:  Henry Page states he is feeling much better.  He continues to have some fatigue with ambulation, but states he did not experience dizziness this morning with ambulation.  He also states that he is feeling better in regards to his breathing.  No BM, + Flatus  Objective: Vital signs in last 24 hours: Temp:  [97.7 F (36.5 C)-98.9 F (37.2 C)] 98.3 F (36.8 C) (08/04 0721) Pulse Rate:  [70-80] 70 (08/04 0900) Cardiac Rhythm:  [-] Atrial paced (08/04 0900) Resp:  [15-23] 19 (08/04 0900) BP: (128-160)/(74-100) 128/77 mmHg (08/04 0900) SpO2:  [89 %-100 %] 89 % (08/04 0900) Weight:  [176 lb 8 oz (80.06 kg)] 176 lb 8 oz (80.06 kg) (08/04 0600)  Intake/Output from previous day: 08/03 0701 - 08/04 0700 In: 1020 [P.O.:180; I.V.:400; Blood:440] Out: 6715 [Urine:6715] Intake/Output this shift: Total I/O In: 300 [P.O.:300] Out: -   General appearance: alert, cooperative and no distress Heart: regular rate and rhythm Lungs: clear to auscultation bilaterally Abdomen: soft, non-tender; bowel sounds normal; no masses,  no organomegaly Extremities: edema 1+ pitting R>L Wound: clean and dry  Lab Results:  Recent Labs  07/26/13 0415 07/27/13 0340  WBC 7.2 7.4  HGB 7.8* 8.7*  HCT 22.9* 25.7*  PLT 95* 130*   BMET:  Recent Labs  07/26/13 0415 07/27/13 0340  NA 132* 135  K 4.1 4.0  CL 101 99  CO2 25 29  GLUCOSE 105* 92  BUN 27* 22  CREATININE 1.48* 1.27  CALCIUM 8.1* 8.3*    PT/INR:  Recent Labs  07/27/13 0340  LABPROT 14.2  INR 1.12   ABG    Component Value Date/Time   PHART 7.409 07/23/2013 1018   HCO3 22.1 07/23/2013 1018   TCO2 20 07/23/2013 1550   ACIDBASEDEF 2.0 07/23/2013 1018   O2SAT 92.0 07/23/2013 1018   CBG (last 3)   Recent Labs  07/25/13 1606 07/25/13 1953 07/26/13 0833  GLUCAP 86 82 77    Assessment/Plan: S/P Procedure(s) (LRB): MITRAL VALVE REPAIR (MVR) (N/A) CORONARY ARTERY BYPASS GRAFTING (CABG) (N/A) MAZE (N/A) INTRAOPERATIVE TRANSESOPHAGEAL ECHOCARDIOGRAM (N/A) PATENT FORAMEN OVALE CLOSURE (N/A)  1. CV- NSR, good pressure control, currently paced at 70, underlying rate remains in 60s- off Amiodarone and no beta blocker ordered due to Bradycardia 2. Pulm- continues to require oxygen, has been weaned down to 2L via [place], encouraged use of IS, will wean as tolerated 3. Renal- creatinine trending down, remains volume overloaded, however this is improving, will continue diuresis 4. INR 1.12- will increase Coumadin to 5 mg tonight 5. Expected blood loss anemia- stable, improved after transfusion Hgb up to 8.7 6. LOC Constipation- will order lactulose prn 7. Dispo- patient stable this morning, remains bradycardia, increase Lopressor, possibly to 2000 soon   LOS: 7 days    BARRETT, Henry Page 07/27/2013  Patient seen and examined. Agree with above Transfer to 2000 - PTCU bed

## 2013-07-27 NOTE — Progress Notes (Signed)
Pt transferred from 2300 into PTCU bed, 2016. Pt amb from SICU pushing W/C with O2.  Pt hooked to tele box and CCMD notified. VSS. Pt assessed. Call bell in reach. Will continue to monitor pt.

## 2013-07-27 NOTE — Plan of Care (Signed)
Problem: Phase III Progression Outcomes Goal: Transfer to PCTU/Telemetry POD Outcome: Completed/Met Date Met:  07/27/13 Pt transferred to PCTU bed 2016 at 1525.

## 2013-07-27 NOTE — Progress Notes (Signed)
NUTRITION FOLLOW UP  DOCUMENTATION CODES  Per approved criteria   -Not Applicable    Intervention:   Continue current interventions. If oral intake declines, pt would benefit from oral nutrition supplements. RD to continue to follow nutrition care plan.  Nutrition Dx:   Increased nutrient needs r/t post-op healing AEB estimated needs.  Goal:   Intake to meet >90% of estimated nutrition needs.  Monitor:   weight trends, lab trends, I/O's, PO intake, supplement tolerance  Assessment:   Admitted for acute on chronic CHF, planned surgery of MV repair and CABG.   Underwent the following on 7/30:  INTRAOPERATIVE TRANSESOPHAGEAL ECHOCARDIOGRAM  CORONARY ARTERY BYPASS GRAFTING (CABG) x 4 MITRAL VALVE REPAIR  MAZE  Extubated 7/31. Stable this morning, has yet to have a BM - PA ordered lactulose prn.  Advanced to Heart Healthy diet on 8/2. Pt is currently eating well, consuming most >75% of meals.  Height: Ht Readings from Last 1 Encounters:  07/21/13 5' 10.08" (1.78 m)    Weight Status:   Wt Readings from Last 1 Encounters:  07/27/13 176 lb 8 oz (80.06 kg)  Wt down 13 lb x 5 days - likely 2/2 negative net fluid balance of -10 liters  Re-estimated needs:  Kcal: 1700 - 1900 Protein: 70 - 85 g  Fluid: 1.6 - 1.9 liters daily  Skin:  Chest incision R leg incision  Diet Order: Cardiac   Intake/Output Summary (Last 24 hours) at 07/27/13 1010 Last data filed at 07/27/13 0900  Gross per 24 hour  Intake   1260 ml  Output   6405 ml  Net  -5145 ml    Last BM: 7/28   Labs:   Recent Labs Lab 07/23/13 0010  07/23/13 1500  07/25/13 0400 07/26/13 0415 07/27/13 0340  NA 137  < >  --   < > 130* 132* 135  K 4.1  < >  --   < > 4.6 4.1 4.0  CL 107  < >  --   < > 98 101 99  CO2 21  < >  --   < > 23 25 29   BUN 21  < >  --   < > 34* 27* 22  CREATININE 1.13  < > 1.43*  < > 2.12* 1.48* 1.27  CALCIUM 7.1*  < >  --   < > 8.4 8.1* 8.3*  MG 2.6*  --  2.1  --   --   --   --    GLUCOSE 136*  < >  --   < > 95 105* 92  < > = values in this interval not displayed.  CBG (last 3)   Recent Labs  07/25/13 1606 07/25/13 1953 07/26/13 0833  GLUCAP 86 82 77    Scheduled Meds: . acetaminophen  1,000 mg Oral Q6H  . aspirin EC  81 mg Oral Daily  . atorvastatin  40 mg Oral q1800  . bisacodyl  10 mg Oral Daily   Or  . bisacodyl  10 mg Rectal Daily  . docusate sodium  200 mg Oral Daily  . furosemide  40 mg Oral Daily  . pantoprazole  40 mg Oral Daily  . potassium chloride  20 mEq Oral Daily  . sodium chloride  10-40 mL Intracatheter Q12H  . sodium chloride  3 mL Intravenous Q12H  . warfarin  5 mg Oral q1800  . Warfarin - Physician Dosing Inpatient   Does not apply q1800    Continuous Infusions: .  sodium chloride 250 mL (07/25/13 0400)    Jarold Motto MS, RD, LDN Pager: [phone] After-hours pager: [phone]

## 2013-07-27 NOTE — Evaluation (Signed)
Physical Therapy Evaluation Patient Details Name: Henry Page MRN: [id number] DOB: 10/25/1947 Today's Date: 07/27/2013 Time: 1478-2956 PT Time Calculation (min): 21 min  PT Assessment / Plan / Recommendation History of Present Illness  Patient is a 67 y/o male admitted with moderate mitral valve regurgitation.  Breathng worsened signif over 24 hours before admit.  Found to be in SVT in ER  Rx with diltiazem.  BNP elev at 489  CT of chest showed pulm edema, coronary atheroscleroisis in LAD and LCx and LM.   Now s/p MVR and CABGx4, MAZE procedure and closure of PFO on 07/22/13.   Clinical Impression  Patient presents with decreased independence with mobility due to sternal precautions, decreased activity tolerance and will benefit from skilled PT in the acute setting to allow return home with wife at independent level with mobility.  Recommend outpatient cardiac rehab upon D/C.    PT Assessment  Patient needs continued PT services    Follow Up Recommendations  No PT follow up    Does the patient have the potential to tolerate intense rehabilitation    N/A  Barriers to Discharge  None      Equipment Recommendations  Rolling walker with 5" wheels (versus none depending on progress)    Recommendations for Other Services  Cardiac rehab  Frequency Min 3X/week    Precautions / Restrictions Precautions Precautions: Sternal   Pertinent Vitals/Pain No pain complaints; HR 70 (paced) SpO2 92% on 2L at rest (dropped to 84% with ambulation), BP 145/78 after ambulation      Mobility  Bed Mobility Bed Mobility: Not assessed Details for Bed Mobility Assistance: up in chair Transfers Transfers: Sit to Stand;Stand to Sit Sit to Stand: From chair/3-in-1;4: Min guard Stand to Sit: To chair/3-in-1;4: Min guard Ambulation/Gait Ambulation/Gait Assistance: 4: Min guard Ambulation Distance (Feet): 400 Feet Assistive device: Other (Comment) Ambulation/Gait Assistance Details: pushing wheelchair;  slow steady gait Gait Pattern: Decreased stride length;Step-through pattern    Exercises Total Joint Exercises Ankle Circles/Pumps: AROM;10 reps;Both;Seated   PT Diagnosis: Generalized weakness;Abnormality of gait  PT Problem List: Decreased activity tolerance;Decreased mobility;Decreased knowledge of precautions PT Treatment Interventions: DME instruction;Gait training;Stair training;Functional mobility training;Therapeutic exercise;Therapeutic activities;Patient/family education;Balance training     PT Goals(Current goals can be found in the care plan section) Acute Rehab PT Goals Patient Stated Goal: To return to walking a mile in a month PT Goal Formulation: With patient Time For Goal Achievement: 08/03/13 Potential to Achieve Goals: Good  Visit Information  Last PT Received On: 07/27/13 History of Present Illness: Patient is a 67 y/o male admitted with moderate mitral valve regurgitation.  Breathng worsened signif over 24 hours before admit.  Found to be in SVT in ER  Rx with diltiazem.  BNP elev at 489  CT of chest showed pulm edema, coronary atheroscleroisis in LAD and LCx and LM.   Now s/p MVR and CABGx4, MAZE procedure and closure of PFO on 07/22/13.        Prior Functioning  Home Living Family/patient expects to be discharged to:: Private residence Living Arrangements: Spouse/significant other Available Help at Discharge: Available 24 hours/day Type of Home: House Home Access: Stairs to enter [employer] of Steps: 3 Entrance Stairs-Rails: Left;Right Home Layout: One level Home Equipment: None Prior Function Level of Independence: Independent Comments: was walking about a mile each morning prior to surgery; worked in Psychiatric nurse Communication: No difficulties Dominant Hand: Right    Cognition  Cognition Arousal/Alertness: Awake/alert Behavior During Therapy: [facility] for  tasks assessed/performed Overall Cognitive Status: Within Functional Limits  for tasks assessed    Extremity/Trunk Assessment Upper Extremity Assessment Upper Extremity Assessment: Defer to OT evaluation Lower Extremity Assessment Lower Extremity Assessment: Overall WFL for tasks assessed   Balance Balance Balance Assessed: No  End of Session PT - End of Session Equipment Utilized During Treatment: Gait belt;Oxygen Activity Tolerance: Patient tolerated treatment well Patient left: with call bell/phone within reach;in chair  GP     Julianah Marciel,CYNDI 07/27/2013, 12:12 PM  Sheran Lawless, PT [phone] 07/27/2013

## 2013-07-28 LAB — BASIC METABOLIC PANEL
BUN: 21 mg/dL (ref 6–23)
CO2: 31 mEq/L (ref 19–32)
Calcium: 8.5 mg/dL (ref 8.4–10.5)
Chloride: 98 mEq/L (ref 96–112)
Creatinine, Ser: 1.23 mg/dL (ref 0.50–1.35)
GFR calc Af Amer: 69 mL/min — ABNORMAL LOW (ref >90)
GFR calc non Af Amer: 59 mL/min — ABNORMAL LOW (ref >90)
Glucose, Bld: 83 mg/dL (ref 70–99)
Potassium: 4.6 mEq/L (ref 3.5–5.1)
Sodium: 135 mEq/L (ref 135–145)

## 2013-07-28 LAB — CBC
HCT: 28.1 % — ABNORMAL LOW (ref 39.0–52.0)
Hemoglobin: 9.1 g/dL — ABNORMAL LOW (ref 13.0–17.0)
MCH: 30.5 pg (ref 26.0–34.0)
MCHC: 32.4 g/dL (ref 30.0–36.0)
MCV: 94.3 fL (ref 78.0–100.0)
RBC: 2.98 MIL/uL — ABNORMAL LOW (ref 4.22–5.81)

## 2013-07-28 LAB — PROTIME-INR: Prothrombin Time: 13.9 seconds (ref 11.6–15.2)

## 2013-07-28 LAB — GLUCOSE, CAPILLARY: Glucose-Capillary: 96 mg/dL (ref 70–99)

## 2013-07-28 MED ORDER — BENAZEPRIL HCL 5 MG PO TABS
5.0000 mg | ORAL_TABLET | Freq: Every day | ORAL | Status: DC
  Administered 2013-07-28 – 2013-07-30 (×3): 5 mg via ORAL
  Filled 2013-07-28 (×4): qty 1

## 2013-07-28 MED ORDER — LACTULOSE 10 GM/15ML PO SOLN
30.0000 g | Freq: Every day | ORAL | Status: DC | PRN
  Filled 2013-07-28: qty 45

## 2013-07-28 NOTE — Progress Notes (Signed)
Pt had difficulty with bowel movement this morning, has not has BM since preop.  Upon admin of suppository, noticed pt was impacted.  Pt disimpacted without much difficulty, large hard brown bowel movement produced, tolerated fairly well.  Admin scheduled stool softeners and PRN dose of Milk of mag.  Pt to bed to rest, call bell in reach, very appreciative and "feels much better."  Will continue to monitor. Ave Filter

## 2013-07-28 NOTE — Progress Notes (Signed)
Ambulated 550 ft using rolling walker.on O2 at 2LPM/[place] tolerated well,O2 saturation post ambulation is 94%,HR is 94.Will continue to monitor.

## 2013-07-28 NOTE — Care Management Note (Signed)
    Page 1 of 2   07/31/2013     3:36:54 PM   CARE MANAGEMENT NOTE 07/31/2013  Patient:  Henry Page, Henry Page   Account Number:  [id number]  Date Initiated:  07/28/2013  Documentation initiated by:  Khilee Hendricksen  Subjective/Objective Assessment:   PT S/P MVR, CABG, MAZE, AND PFO CLOSURE ON 07/22/13.  PTA, PT INDEPENDENT, LIVES WITH SPOUSE.     Action/Plan:   WILL FOLLOW FOR HOME NEEDS AS PT PROGRESSES.   Anticipated DC Date:  07/31/2013   Anticipated DC Plan:  HOME W HOME HEALTH SERVICES      DC Planning Services  CM consult      [facility] Choice  HOME HEALTH   Choice offered to / List presented to:  C-1 Patient   DME arranged  [facility]      DME agency  [facility]     [facility] arranged  HH-1 RN      [facility] agency  [facility]   Status of service:  Completed, signed off Medicare Important Message given?   (If response is "NO", the following Medicare IM given date fields will be blank) Date Medicare IM given:   Date Additional Medicare IM given:    Discharge Disposition:  HOME W HOME HEALTH SERVICES  Per UR Regulation:  Reviewed for med. necessity/level of care/duration of stay  If discussed at Long Length of Stay Meetings, dates discussed:   07/30/2013    Comments:  07/31/13 Darice Vicario,RN,BSN [phone] PT FOR DC HOME TODAY.  HE WILL NEED HOME O2, AS CONT TO DESAT WITH AMBULATION.  REFERRAL TO [facility] FOR DME NEEDS. PORTABLE O2 TANK DELIVERED TO PT'S ROOM PRIOR TO DC. PT/WIFE WOULD LIKE HHRN TO VISIT FOR O2 SAT CHECKS AND RESTORATIVE CARE.  REFERRAL TO [facility], PER PT CHOICE.  START OF CARE 24-48H POST DC DATE.  PT DENIES NEED FOR RW.

## 2013-07-28 NOTE — Progress Notes (Signed)
1350-1405 Pt states too exhausted to walk. Still recovering from BM/impaction earlier. Encouraged pt to walk with staff or PT later and he states he will. Gave orange juice and milk at his request. Will follow up tomorrow. Luetta Nutting RNBSN

## 2013-07-28 NOTE — Progress Notes (Signed)
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 After checking the patient she decided to turn off the external pacer.Will closely continue to monitor the patient. Eaven Schwager Jamey Reas RN

## 2013-07-28 NOTE — Progress Notes (Signed)
Physical Therapy Treatment Patient Details Name: Henry Page MRN: [id number] DOB: 10/25/1947 Today's Date: 07/28/2013 Time: 4098-1191 PT Time Calculation (min): 26 min  PT Assessment / Plan / Recommendation  History of Present Illness Patient is a 66 y/o male admitted with moderate mitral valve regurgitation.  Breathng worsened signif over 24 hours before admit.  Found to be in SVT in ER  Rx with diltiazem.  BNP elev at 489  CT of chest showed pulm edema, coronary atheroscleroisis in LAD and LCx and LM.   Now s/p MVR and CABGx4, MAZE procedure and closure of PFO on 07/22/13.    PT Comments   Ambulated patient in hall today with encouragement. Patient with increased fatigue secondary to "rough morning". Patient desaturated to 86% during ambulation. VCs for PLB and rest breaks. Patient educated on sternal precaution compliance as well as proper use of incentive spirometer.  Patient encouraged to continue ambulation with staff.  Will continue to work with patient and progress activity as tolerated. (pt placed back on 2 liters [place] nsg aware)  Follow Up Recommendations  No PT follow up           Equipment Recommendations  Rolling walker with 5" wheels (versus none depending on progress)       Frequency Min 3X/week   Progress towards PT Goals Progress towards PT goals: Progressing toward goals  Plan Current plan remains appropriate    Precautions / Restrictions Precautions Precautions: Sternal Precaution Comments: educated and reinforced sternal precautions and importance of compliance Restrictions Weight Bearing Restrictions: No   Pertinent Vitals/Pain No pain reported at this time, spO2 on rm air 93 % prior to activity, 86 during, 88 post activity, replaced O2 and 97% on 2 liters post activity    Mobility  Bed Mobility Bed Mobility: Rolling Left;Left Sidelying to Sit;Sitting - Scoot to [place] of Bed Rolling Left: 5: Supervision Left Sidelying to Sit: 4: Min assist Supine to Sit: 5:  Supervision Details for Bed Mobility Assistance: VCs for sternal precautions and technique, good compliance Transfers Transfers: Sit to Stand;Stand to Sit Sit to Stand: 4: Min guard Stand to Sit: 4: Min guard Details for Transfer Assistance: VCs for technique with sternal precautions Ambulation/Gait Ambulation/Gait Assistance: 4: Min guard Ambulation Distance (Feet): 210 Feet Assistive device: Rolling walker Ambulation/Gait Assistance Details: VCs for upright posture and rest breaks, educated patient on PLB technique O2 sats monitored desaturation to 86% with ambulation Gait Pattern: Decreased stride length;Step-through pattern Gait velocity: decreased    Exercises Total Joint Exercises Ankle Circles/Pumps: AROM;10 reps;Both;Seated    PT Goals (current goals can now be found in the care plan section) Acute Rehab PT Goals Patient Stated Goal: To return to walking a mile in a month PT Goal Formulation: With patient Time For Goal Achievement: 08/03/13 Potential to Achieve Goals: Good  Visit Information  Last PT Received On: 07/28/13 Assistance Needed: +1 History of Present Illness: Patient is a 67 y/o male admitted with moderate mitral valve regurgitation.  Breathng worsened signif over 24 hours before admit.  Found to be in SVT in ER  Rx with diltiazem.  BNP elev at 489  CT of chest showed pulm edema, coronary atheroscleroisis in LAD and LCx and LM.   Now s/p MVR and CABGx4, MAZE procedure and closure of PFO on 07/22/13.     Subjective Data  Subjective: I have had a rough day Patient Stated Goal: To return to walking a mile in a month   Cognition  Cognition Arousal/Alertness: Awake/alert  Behavior During Therapy: WFL for tasks assessed/performed Overall Cognitive Status: Within Functional Limits for tasks assessed    Balance  Balance Balance Assessed: No  End of Session PT - End of Session Equipment Utilized During Treatment: Gait belt;Oxygen Activity Tolerance: Patient  tolerated treatment well Patient left: with call bell/phone within reach;in chair   GP     [name] 07/28/2013, 3:14 PM [name], PT DPT  [phone]

## 2013-07-28 NOTE — Progress Notes (Addendum)
6 Days Post-Op  Procedure(s) (LRB): MITRAL VALVE REPAIR (MVR) (N/A) CORONARY ARTERY BYPASS GRAFTING (CABG) (N/A) MAZE (N/A) INTRAOPERATIVE TRANSESOPHAGEAL ECHOCARDIOGRAM (N/A) PATENT FORAMEN OVALE CLOSURE (N/A) Subjective: Overall conts to feel better  Objective  Telemetry appears junctional in the 70's  Temp:  [97.6 F (36.4 C)-98.5 F (36.9 C)] 98 F (36.7 C) (08/05 0403) Pulse Rate:  [68-70] 70 (08/05 0403) Resp:  [17-21] 20 (08/05 0403) BP: (128-164)/(73-99) 164/98 mmHg (08/05 0403) SpO2:  [89 %-100 %] 99 % (08/05 0403) Weight:  [175 lb 11.3 oz (79.7 kg)] 175 lb 11.3 oz (79.7 kg) (08/05 0403)   Intake/Output Summary (Last 24 hours) at 07/28/13 0800 Last data filed at 07/28/13 0410  Gross per 24 hour  Intake    850 ml  Output   2325 ml  Net  -1475 ml       General appearance: alert, cooperative and no distress Heart: regular rate and rhythm Lungs: basilar crackles Abdomen: benign Extremities: + minor LE edema Wound: incis healing well  Lab Results:  Recent Labs  07/27/13 0340 07/28/13 0500  NA 135 135  K 4.0 4.6  CL 99 98  CO2 29 31  GLUCOSE 92 83  BUN 22 21  CREATININE 1.27 1.23  CALCIUM 8.3* 8.5   No results found for this basename: AST, ALT, ALKPHOS, BILITOT, PROT, ALBUMIN,  in the last 72 hours No results found for this basename: LIPASE, AMYLASE,  in the last 72 hours  Recent Labs  07/27/13 0340 07/28/13 0500  WBC 7.4 6.8  HGB 8.7* 9.1*  HCT 25.7* 28.1*  MCV 92.4 94.3  PLT 130* 184   No results found for this basename: CKTOTAL, CKMB, TROPONINI,  in the last 72 hours No components found with this basename: POCBNP,  No results found for this basename: DDIMER,  in the last 72 hours No results found for this basename: HGBA1C,  in the last 72 hours No results found for this basename: CHOL, HDL, LDLCALC, TRIG, CHOLHDL,  in the last 72 hours No results found for this basename: TSH, T4TOTAL, FREET3, T3FREE, THYROIDAB,  in the last 72 hours No  results found for this basename: VITAMINB12, FOLATE, FERRITIN, TIBC, IRON, RETICCTPCT,  in the last 72 hours  Medications: Scheduled . aspirin EC  81 mg Oral Daily  . atorvastatin  40 mg Oral q1800  . bisacodyl  10 mg Oral Daily   Or  . bisacodyl  10 mg Rectal Daily  . docusate sodium  200 mg Oral Daily  . furosemide  40 mg Oral Daily  . pantoprazole  40 mg Oral Daily  . potassium chloride  20 mEq Oral Daily  . sodium chloride  3 mL Intravenous Q12H  . warfarin  5 mg Oral q1800  . Warfarin - Physician Dosing Inpatient   Does not apply q1800     Radiology/Studies:  No results found.  INR:1.09 Will add last result for INR, ABG once components are confirmed Will add last 4 CBG results once components are confirmed  Assessment/Plan: S/P Procedure(s) (LRB): MITRAL VALVE REPAIR (MVR) (N/A) CORONARY ARTERY BYPASS GRAFTING (CABG) (N/A) MAZE (N/A) INTRAOPERATIVE TRANSESOPHAGEAL ECHOCARDIOGRAM (N/A) PATENT FORAMEN OVALE CLOSURE (N/A)  1 BP elevated, will add low dose ACEinh as creat has normalized 2 cont diuresis for volume overload 3 cont ac rx- coumadin increased to 5 mg yesterday 4 labs stable 5 rhythm is junc. Cont to observe off beta blocker  LOS: 8 days    GOLD,WAYNE E 8/5/20148:00 AM  Slow improvement  patient examined and medical record reviewed,agree with above note. VAN TRIGT III,Rashauna Tep 07/28/2013

## 2013-07-29 ENCOUNTER — Other Ambulatory Visit: Payer: Self-pay

## 2013-07-29 LAB — BASIC METABOLIC PANEL
BUN: 18 mg/dL (ref 6–23)
Chloride: 97 mEq/L (ref 96–112)
GFR calc non Af Amer: 62 mL/min — ABNORMAL LOW (ref >90)
Glucose, Bld: 107 mg/dL — ABNORMAL HIGH (ref 70–99)
Potassium: 5.4 mEq/L — ABNORMAL HIGH (ref 3.5–5.1)
Sodium: 132 mEq/L — ABNORMAL LOW (ref 135–145)

## 2013-07-29 MED ORDER — MAGNESIUM HYDROXIDE 400 MG/5ML PO SUSP
30.0000 mL | Freq: Every day | ORAL | Status: DC
  Filled 2013-07-29 (×2): qty 30

## 2013-07-29 NOTE — Progress Notes (Signed)
CARDIAC REHAB PHASE I   PRE:  Rate/Rhythm: 73? JR vs. SR  BP:  Supine: 139/86  Sitting:   Standing:    SaO2: 95%2L, 92%RA  MODE:  Ambulation: 550 ft   POST:  Rate/Rhythm: 81 ?JR vs.SR  BP:  Supine:   Sitting: 132/71  Standing:    SaO2: 86%RA, put on 2L and went to 76% walking, 4L to keep sats above 90%. 7829-5621 Pt c/o generalized weakness during walk. Pt walked 550 ft with rolling walker and asst x 1. Started out on RA to 2L to 4L to keep sats above 90%. Pt took 2 rest breaks. Said nose is very stuffy today. Encouraged IS. To recliner with call bell and put back to 2L.   Luetta Nutting, RN BSN  07/29/2013 9:17 AM

## 2013-07-29 NOTE — Progress Notes (Addendum)
[address]       [place] [address]             [phone]      7 Days Post-Op  Procedure(s) (LRB): MITRAL VALVE REPAIR (MVR) (N/A) CORONARY ARTERY BYPASS GRAFTING (CABG) (N/A) MAZE (N/A) INTRAOPERATIVE TRANSESOPHAGEAL ECHOCARDIOGRAM (N/A) PATENT FORAMEN OVALE CLOSURE (N/A) Subjective: Had some trouble with constipation/discomfort yesterday but did have BM  Objective  Telemetry junct in 70's   Temp:  [97.5 F (36.4 C)-99.4 F (37.4 C)] 97.5 F (36.4 C) (08/06 0341) Pulse Rate:  [67-76] 67 (08/06 0341) Resp:  [16-20] 16 (08/06 0341) BP: (133-139)/(71-78) 139/78 mmHg (08/06 0341) SpO2:  [93 %-98 %] 98 % (08/06 0341) Weight:  [172 lb 8 oz (78.245 kg)] 172 lb 8 oz (78.245 kg) (08/06 0341)   Intake/Output Summary (Last 24 hours) at 07/29/13 0749 Last data filed at 07/29/13 0341  Gross per 24 hour  Intake    240 ml  Output   1975 ml  Net  -1735 ml       General appearance: alert, cooperative and no distress Heart: regular rate and rhythm Lungs: dim in lower fields Abdomen: soft, nontender, nondistended Extremities: minor edema Wound: incis healing well  Lab Results:  Recent Labs  07/28/13 0500 07/29/13 0400  NA 135 132*  K 4.6 5.4*  CL 98 97  CO2 31 31  GLUCOSE 83 107*  BUN 21 18  CREATININE 1.23 1.19  CALCIUM 8.5 8.7   No results found for this basename: AST, ALT, ALKPHOS, BILITOT, PROT, ALBUMIN,  in the last 72 hours No results found for this basename: LIPASE, AMYLASE,  in the last 72 hours  Recent Labs  07/27/13 0340 07/28/13 0500  WBC 7.4 6.8  HGB 8.7* 9.1*  HCT 25.7* 28.1*  MCV 92.4 94.3  PLT 130* 184   No results found for this basename: CKTOTAL, CKMB, TROPONINI,  in the last 72 hours No components found with this basename: POCBNP,  No results found for this basename: DDIMER,  in the last 72 hours No results found for this basename: HGBA1C,  in the last 72 hours No results found for this basename: CHOL, HDL,  LDLCALC, TRIG, CHOLHDL,  in the last 72 hours No results found for this basename: TSH, T4TOTAL, FREET3, T3FREE, THYROIDAB,  in the last 72 hours No results found for this basename: VITAMINB12, FOLATE, FERRITIN, TIBC, IRON, RETICCTPCT,  in the last 72 hours  Medications: Scheduled . aspirin EC  81 mg Oral Daily  . atorvastatin  40 mg Oral q1800  . benazepril  5 mg Oral Daily  . bisacodyl  10 mg Oral Daily   Or  . bisacodyl  10 mg Rectal Daily  . docusate sodium  200 mg Oral Daily  . furosemide  40 mg Oral Daily  . pantoprazole  40 mg Oral Daily  . potassium chloride  20 mEq Oral Daily  . sodium chloride  3 mL Intravenous Q12H  . warfarin  5 mg Oral q1800  . Warfarin - Physician Dosing Inpatient   Does not apply q1800     Radiology/Studies:  No results found.  INR:1.33 Will add last result for INR, ABG once components are confirmed Will add last 4 CBG results once components are confirmed  Assessment/Plan: S/P Procedure(s) (LRB): MITRAL VALVE REPAIR (MVR) (N/A) CORONARY ARTERY BYPASS GRAFTING (CABG) (N/A) MAZE (N/A) INTRAOPERATIVE TRANSESOPHAGEAL ECHOCARDIOGRAM (N/A) PATENT FORAMEN OVALE CLOSURE (N/A)  1 doing well 2 BP improved.  Creat improving even on ACEI 3 junct rhythm, pacer was sensing incorrectly and is off 4 wean O2 off and push rehab/pulm toilet 5 K+ is up, will hold K+ for now 6 cont AC RX, riding- will cont to dose coumadin at 5 mg for now    LOS: 9 days    GOLD,WAYNE E 8/6/20147:49 AM  patient seen and examined, agree with above Feels much better today

## 2013-07-29 NOTE — Progress Notes (Signed)
Physical Therapy Treatment Patient Details Name: Henry Page MRN: [id number] DOB: 10/25/1947 Today's Date: 07/29/2013 Time: 6578-4696 PT Time Calculation (min): 23 min  PT Assessment / Plan / Recommendation  History of Present Illness Patient is a 67 y/o male admitted with moderate mitral valve regurgitation.  Breathng worsened signif over 24 hours before admit.  Found to be in SVT in ER  Rx with diltiazem.  BNP elev at 489  CT of chest showed pulm edema, coronary atheroscleroisis in LAD and LCx and LM.   Now s/p MVR and CABGx4, MAZE procedure and closure of PFO on 07/22/13.    PT Comments   Patient demonstrates continued improvements in activity tolerance and mobility. Good compliance with sternal precautions. Educated patient on techniques for increased energy conservation and O2 supply. Patient ambulated on 2 liters with O2 >95%, on 1 liter patient dropped to 88%. Patient on room air at rest 90%. Replaced 1 liter.  Patient continues to demonstrate daily progress. Will continue to work with patient as indicated.  Follow Up Recommendations  No PT follow up           Equipment Recommendations  Rolling walker with 5" wheels (versus none depending on progress)       Frequency Min 3X/week   Progress towards PT Goals Progress towards PT goals: Progressing toward goals  Plan Current plan remains appropriate    Precautions / Restrictions Precautions Precautions: Sternal Precaution Comments: educated and reinforced sternal precautions and importance of compliance Restrictions Weight Bearing Restrictions: No   Pertinent Vitals/Pain No pain at this time, SpO2 on 1 liter pre activity 98%, rm air 93%, ambulation on 2 liters >95% on 1 liter dropped to 88%     Mobility  Bed Mobility Bed Mobility: Not assessed Transfers Transfers: Sit to Stand;Stand to Sit Sit to Stand: 5: Supervision Stand to Sit: 5: Supervision Details for Transfer Assistance: VCs for technique with sternal  precautions Ambulation/Gait Ambulation/Gait Assistance: 5: Supervision Ambulation Distance (Feet): 650 Feet Assistive device: Rolling walker (ambulated on supplemental O2) Ambulation/Gait Assistance Details: VCs for rest break, PLB to increase O2, educated on importance of energy conservation Gait Pattern: Decreased stride length;Step-through pattern Gait velocity: decreased General Gait Details: multiple rest breaks to assess vitals and discuss breathing techniques with teach back    Exercises Total Joint Exercises Ankle Circles/Pumps: AROM;10 reps;Both;Seated   PT Diagnosis:    PT Problem List:   PT Treatment Interventions:     PT Goals (current goals can now be found in the care plan section) Acute Rehab PT Goals Patient Stated Goal: To return to walking a mile in a month PT Goal Formulation: With patient Time For Goal Achievement: 08/03/13 Potential to Achieve Goals: Good  Visit Information  Last PT Received On: 07/29/13 Assistance Needed: +1 History of Present Illness: Patient is a 67 y/o male admitted with moderate mitral valve regurgitation.  Breathng worsened signif over 24 hours before admit.  Found to be in SVT in ER  Rx with diltiazem.  BNP elev at 489  CT of chest showed pulm edema, coronary atheroscleroisis in LAD and LCx and LM.   Now s/p MVR and CABGx4, MAZE procedure and closure of PFO on 07/22/13.     Subjective Data  Subjective: I feel a little better today Patient Stated Goal: To return to walking a mile in a month   Cognition  Cognition Arousal/Alertness: Awake/alert Behavior During Therapy: WFL for tasks assessed/performed Overall Cognitive Status: Within Functional Limits for tasks assessed  Balance  Balance Balance Assessed: Yes Improved stability with functional activities; turns, weight shifts, and reaching.    End of Session PT - End of Session Equipment Utilized During Treatment: Gait belt;Oxygen Activity Tolerance: Patient tolerated treatment  well Patient left: with call bell/phone within reach;in chair Nurse Communication: Mobility status (02 saturations >95% on 2 liters, 88% on 1 liter)   GP     [name] 07/29/2013, 3:10 PM [name], PT DPT  [phone]

## 2013-07-29 NOTE — Progress Notes (Signed)
Ambulated 400 ft using rolling walker on O2 at 2 LPM/[place],tolerated well,O2 saturation post ambulation is 96%.

## 2013-07-30 LAB — PROTIME-INR: INR: 1.69 — ABNORMAL HIGH (ref 0.00–1.49)

## 2013-07-30 MED ORDER — SODIUM CHLORIDE 0.9 % IJ SOLN: 3.0000 mL | INTRAMUSCULAR | Status: DC | PRN

## 2013-07-30 MED ORDER — SODIUM CHLORIDE 0.9 % IJ SOLN
3.0000 mL | Freq: Two times a day (BID) | INTRAMUSCULAR | Status: DC
  Administered 2013-07-30: 3 mL via INTRAVENOUS

## 2013-07-30 NOTE — Discharge Summary (Signed)
Physician Discharge Summary  Patient ID: Henry Page MRN: [id number] DOB/AGE: 68/01/1947 68 y.o.  Admit date: 07/20/2013 Discharge date: 07/31/2013  Admission Diagnoses:  Patient Active Problem List   Diagnosis Date Noted  . CAD (coronary artery disease) 07/21/2013  . CKD (chronic kidney disease), stage III   . Mitral valve prolapse 07/06/2013  . Mitral regurgitation 07/06/2013  . Atrial fibrillation 07/06/2013  . Essential hypertension, benign 07/06/2013  . Acute combined systolic and diastolic congestive heart failure 07/06/2013   Discharge Diagnoses:   Patient Active Problem List   Diagnosis Date Noted  . S/P mitral valve repair 07/22/2013  . 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  . S/P Maze operation for atrial fibrillation 07/22/2013  . CAD (coronary artery disease) 07/21/2013  . CKD (chronic kidney disease), stage III   . Mitral valve prolapse 07/06/2013  . Mitral regurgitation 07/06/2013  . Atrial fibrillation 07/06/2013  . Essential hypertension, benign 07/06/2013  . Acute combined systolic and diastolic congestive heart failure 07/06/2013   Discharged Condition: good  History of Present Illness:   Patient is a 67 year old married white male known history of mitral valve prolapse and mitral regurgitation who was transferred from [facility] for evaluation of severe mitral regurgitation with rapid atrial fibrillation and acute combined systolic and diastolic congestive heart failure. The patient reports that he has known for many years that he had a heart murmur. He has been told in the past that he had mitral valve prolapse and mitral regurgitation. He has been treated intermittently for hypertension and in the past had been seen by Dr. Excell Page, most recently in 2008. The patient has otherwise remained remarkably healthy all of his life and physically active. He does note a long history of mild exertional shortness of breath. However, this had been quite stable until last week  when he somewhat acutely developed severe exertional shortness of breath and intermittent episodes of resting shortness of breath prompting presentation to the Emergency Department.  Workup revealed an elevated BNP level of 489.  CT scan of the chest was obtained and showed Pulmonary Edema with Coronary atherosclerosis of the LAD and Circumflex artery.  He was treated with diuresis and placed on a Beta Blocker with improvement of symptoms.  He underwent TTE which showed  Mitral Valve Prolapse of the posterior leaflet and severe regurgitation.  Ejection fraction was preserved.  The patient was transferred to [facility] for further workup.  Upon arrival to [facility] the patient was admitted to the Cardiology service.  Cardiothoracic surgery consult was obtained.  The patient was evaluated by Dr. Cornelius Moras on 07/06/2013 at which time it was felt the patient should undergo TEE and cardiac catheterization for better evaluation of his Valvular disease and Coronary Artery Disease.  It was also recommended the patient be loaded with IV Amiodarone.  The patient was taken for catheterization on 07/07/2013 at which time the patient was found to have multi-vessel CAD.  He underwent TEE on 07/08/2013 which confirmed the presence of Mitral Valve Prolapse of P2 Leaflet.  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  The patient responded well to treatment and was eventually discharged home in stable condition.  He presented for follow up at [facility] on 07/20/2013 and was evaluated by Dr. Cornelius Moras.  At that visit the patient was again experiencing worsening shortness of breath.  There was concern the patient was suffering from a CHF exacerbation and would warrant admission.  The patient was agreeable to this  and was again admitted to the Cardiology service.    Hospital Course:    Upon arrival to [facility] he was again treated with diuretic therapy.  The patient felt better and was taken to the operating room on  07/22/2013.  He underwent CABG x 4 utilizing LIMA to LAD, Sequential SVG to OM and Ramus Intermediate, and SVG to RCA, Endoscopic Saphenous vein harvest of the right thigh, MV Repair with a 34 mm Sorin Memo 3D ring, and a Complete Cox MAZE procedure with clipping of Left Atrial Appendage, and closure of Patent Foramen Ovale.  The patient tolerated the procedure well and was taken to the SICU in stable condition.  During his stay in the ICU the patient was weaned down off all cardiac drips.  He was weaned off the vent and extubated POD #1.  He was aggressively diuresis for hypervolemia.  His chest tubes and wires were removed without difficulty.  Patient developed marked Bradycardia with heart rates in the 30s, resulting in subsequent discontinuation of Amiodarone.  He was started on Coumadin for stroke prophylaxis.  The patient received blood transfusion for blood loss anemia associated with weakness and dizziness with ambulation.  The patient continued to progress and was transferred to the stepdown unit once medically stable.  The patient developed some junctional bradycardia.  He remained off Beta Blocker.  His heart rate has since stabilized and the patient is maintaining NSR.  His pacing wires have been removed.  His INR is 1.76, he is currently on 5 mg of coumadin with a goal INR on 2.5-3.5.  He is ambulating without difficulty and tolerating a cardiac diet.  He does desat when ambulating, requiring 2 liters of oxygen with exertion, therefore, home oxygen has been arranged.  The patient is medically stable at this time for discharge home.     Follow Up: He will need to follow up with Dr. Cornelius Moras in 3 weeks with a CXR.  He will also need to follow up with Dr. Excell Page in 2 weeks.  He will also need to have his INR checked on Monday 08/03/2013 at [facility].            Significant Diagnostic Studies:   Cardiac Catheterization:   Hemodynamics:  RA: 10/11/9  RV: 43/6  PCWP: 21/25/21  PA: 43/25 (32)   Cardiac Output  Thermodilution: 3.78 Index of 1.94  Fick : 4.01 Index of 2.06  Arterial Sat: 97%  PA Sat: 61%.  LV pressure: 99/17  Aortic pressure: 106/75  Angiography  Left Main: distal 40% , calcified  Left anterior Descending: proximal 40 % - 60% calcified stenosis. Mid 60-70% stenosis. diags are small - moderate in size and are otherwise normal.  Left Circumflex: moderate sized system. 50 % lesion in OM branch  Ramus Intermediate: Large vessel 40-50% stenosis in proximal vessel.  Right Coronary Artery: large , dominant, mid 70 - 80% (long stenosis) . PDA  LV Gram: normal LV function , EF 55%, Moderate - severe MR  Transesophageal Echocardiogram:   - Left ventricle: The cavity size was normal. There was mild concentric hypertrophy. Systolic function was normal. The estimated ejection fraction was in the range of 55% to 60%. Wall motion was normal; there were no regional wall motion abnormalities. - Aortic valve: There was no stenosis. - Aorta: Normal caliber, mild plaque. 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 - Left atrium: The atrium was moderately dilated. No evidence  of thrombus in the atrial cavity or appendage. - Pulmonary veins: Systolic flow reversal in pulmonary vein doppler pattern. - Right ventricle: The cavity size was normal. Systolic function was normal. - Right atrium: No evidence of thrombus in the atrial cavity or appendage.  Treatments: surgery:   Procedure:  Coronary Artery Bypass Grafting x 4  Left Internal Mammary Artery to Distal Left Anterior Descending Coronary Artery  Saphenous Vein Graft to Distal Right Coronary Artery  Sequential Saphenous Vein Graft to Ramus Intermediate Branch and Obtuse Marginal Branch of Left Circumflex Coronary Artery  Endoscopic Vein Harvest from Right Thigh  Mitral Valve Repair Complex valvuloplasty including triangular resection  of Posterior Leaflet  Artificial Gore-tex neocord placement x2  Sorin Memo 3D ring annuloplasty (size 34mm, catalog #[id number], serial [id number])  Maze Procedure  complete biatrial atrial lesion set using bipolar radiofrequency and cryothermy ablation  clipping of left atrial appendage (Atriclip size 45mm)  Closure of Patent Foramen Ovale     Disposition: 01-Home or Self Care  Discharge medications:    Medication List    STOP taking these medications       amiodarone 200 MG tablet  Commonly known as:  PACERONE     amoxicillin 500 MG capsule  Commonly known as:  AMOXIL     CORICIDIN HBP FLU PO     metoprolol tartrate 25 MG tablet  Commonly known as:  LOPRESSOR     nitroGLYCERIN 0.4 MG SL tablet  Commonly known as:  NITROSTAT      TAKE these medications       acetaminophen 325 MG tablet  Commonly known as:  TYLENOL  Take 2 tablets (650 mg total) by mouth every 4 (four) hours as needed.     ALPRAZolam 0.5 MG tablet  Commonly known as:  XANAX  Take 0.5 mg by mouth 2 (two) times daily as needed for sleep.     aspirin 81 MG EC tablet  Take 1 tablet (81 mg total) by mouth daily.     atorvastatin 40 MG tablet  Commonly known as:  LIPITOR  Take 1 tablet (40 mg total) by mouth daily at 6 PM.     azelastine 137 MCG/SPRAY nasal spray  Commonly known as:  ASTELIN  Place 1 spray into the nose daily as needed for rhinitis. Use in each nostril as directed     benazepril 10 MG tablet  Commonly known as:  LOTENSIN  Take 1 tablet (10 mg total) by mouth daily.     desoximetasone 0.25 % cream  Commonly known as:  TOPICORT  Apply 1 application topically daily as needed.     traMADol 50 MG tablet  Commonly known as:  ULTRAM  Take 1 tablet (50 mg total) by mouth every 6 (six) hours as needed for pain.     warfarin 5 MG tablet  Commonly known as:  COUMADIN  Take 1 tablet (5 mg total) by mouth daily. Or as directed by Coumadin Clinic            The patient has been  discharged on:   1.Beta Blocker:  Yes [   ]                              No   [x   ]  If No, reason: Bradycardia  2.Ace Inhibitor/ARB: Yes [x   ]                                     No  [    ]                                     If No, reason:  3.Statin:   Yes [ x  ]                  No  [   ]                  If No, reason:  4.Ecasa:  Yes  [  x ]                  No   [   ]                  If No, reason:      Signed: BARRETT, ERIN 07/30/2013, 9:06 AM

## 2013-07-30 NOTE — Progress Notes (Signed)
Patient ambulated 400 ft in hallway using walker while on 2L nasal cannula. Transferred to room 2001; report and care released to [facility], Rn.[name], Gordy Savers

## 2013-07-30 NOTE — Progress Notes (Signed)
Physical Therapy Treatment Patient Details Name: Henry Page MRN: [id number] DOB: 10/25/1947 Today's Date: 07/30/2013 Time: 4098-1191 PT Time Calculation (min): 25 min  PT Assessment / Plan / Recommendation  History of Present Illness Patient is a 67 y/o male admitted with moderate mitral valve regurgitation.  Breathng worsened signif over 24 hours before admit.  Found to be in SVT in ER  Rx with diltiazem.  BNP elev at 489  CT of chest showed pulm edema, coronary atheroscleroisis in LAD and LCx and LM.   Now s/p MVR and CABGx4, MAZE procedure and closure of PFO on 07/22/13.    PT Comments   Pt continues to demonstrate steady progress towards PT goals. Feel patient will be able to dc home with spouse assist when medically ready.  Patient may need home O2 secondary to desaturation with ambulation.    Follow Up Recommendations  No PT follow up           Equipment Recommendations  Rolling walker with 5" wheels       Frequency Min 3X/week   Progress towards PT Goals Progress towards PT goals: Progressing toward goals  Plan Current plan remains appropriate    Precautions / Restrictions Precautions Precautions: Sternal Precaution Comments: educated and reinforced sternal precautions and importance of compliance Restrictions Weight Bearing Restrictions: No   Pertinent Vitals/Pain No pain, SpO2 on rm air 93% with ambulation 81% on room air, on 1 liter 84%, 94% on 2 liters with ambulation    Mobility  Bed Mobility Bed Mobility: Rolling Left;Left Sidelying to Sit;Sitting - Scoot to [place] of Bed Rolling Left: 5: Supervision Left Sidelying to Sit: 4: Min assist Supine to Sit: 5: Supervision Details for Bed Mobility Assistance: VCs for sternal precautions and technique, good compliance Transfers Transfers: Sit to Stand;Stand to Sit Sit to Stand: 5: Supervision Stand to Sit: 5: Supervision Details for Transfer Assistance: VCs for technique with sternal  precautions Ambulation/Gait Ambulation/Gait Assistance: 5: Supervision Ambulation Distance (Feet): 600 Feet Assistive device: Rolling walker (ambulated on supplemental O2) Ambulation/Gait Assistance Details: ambulated on 2 liters Gait Pattern: Decreased stride length;Step-through pattern Gait velocity: improved General Gait Details: 3 standing rest breaks to assess vitals Stairs: Yes Stairs Assistance: 5: Supervision Stairs Assistance Details (indicate cue type and reason): cues for energy conservation Stair Management Technique: One rail Right;Forwards Number of Stairs: 6    Exercises     PT Diagnosis:    PT Problem List:   PT Treatment Interventions:     PT Goals (current goals can now be found in the care plan section) Acute Rehab PT Goals Patient Stated Goal: To return to walking a mile in a month PT Goal Formulation: With patient Time For Goal Achievement: 08/03/13 Potential to Achieve Goals: Good  Visit Information  Last PT Received On: 07/30/13 Assistance Needed: +1 History of Present Illness: Patient is a 67 y/o male admitted with moderate mitral valve regurgitation.  Breathng worsened signif over 24 hours before admit.  Found to be in SVT in ER  Rx with diltiazem.  BNP elev at 489  CT of chest showed pulm edema, coronary atheroscleroisis in LAD and LCx and LM.   Now s/p MVR and CABGx4, MAZE procedure and closure of PFO on 07/22/13.     Subjective Data  Subjective: I feel a little better today Patient Stated Goal: To return to walking a mile in a month   Cognition  Cognition Arousal/Alertness: Awake/alert Behavior During Therapy: WFL for tasks assessed/performed Overall Cognitive Status: Within  Functional Limits for tasks assessed    Balance  Balance Balance Assessed: No  End of Session PT - End of Session Equipment Utilized During Treatment: Gait belt;Oxygen Activity Tolerance: Patient tolerated treatment well Patient left: in bed;with call bell/phone within  reach;with family/visitor present Nurse Communication: Mobility status (02 saturations >95% on 2 liters, 88% on 1 liter)   GP     [name] 07/30/2013, 4:32 PM [name], PT DPT  [phone]

## 2013-07-30 NOTE — Progress Notes (Signed)
Pt ambulated for the second time today with assistance around unit before moving to room 2001. Pt tolerated well.

## 2013-07-30 NOTE — Progress Notes (Signed)
EPW removed intact per order/protocal. No bleeding or ectopy noted. Patient instructed to lay supine in bed x1hr. VSS. Call bell near.Mamie Levers

## 2013-07-30 NOTE — Progress Notes (Addendum)
[address]       [place] [address]             [phone]      8 Days Post-Op  Procedure(s) (LRB): MITRAL VALVE REPAIR (MVR) (N/A) CORONARY ARTERY BYPASS GRAFTING (CABG) (N/A) MAZE (N/A) INTRAOPERATIVE TRANSESOPHAGEAL ECHOCARDIOGRAM (N/A) PATENT FORAMEN OVALE CLOSURE (N/A) Subjective:  feels fair, tired and worn out this am from poor sleep and active day yesterday  Objective  Telemetry sinus rhythm  Temp:  [98.3 F (36.8 C)-98.9 F (37.2 C)] 98.3 F (36.8 C) (08/07 0544) Pulse Rate:  [72-76] 72 (08/07 0544) Resp:  [16-20] 20 (08/07 0544) BP: (114-134)/(68-79) 134/79 mmHg (08/07 0544) SpO2:  [95 %-96 %] 96 % (08/07 0544) Weight:  [169 lb 11.2 oz (76.975 kg)] 169 lb 11.2 oz (76.975 kg) (08/07 0544)   Intake/Output Summary (Last 24 hours) at 07/30/13 0749 Last data filed at 07/30/13 0738  Gross per 24 hour  Intake      0 ml  Output   2400 ml  Net  -2400 ml       General appearance: alert, cooperative and no distress Heart: regular rate and rhythm Lungs: dim in bases Abdomen: benign  Extremities: no edema Wound: incis healing well  Lab Results:  Recent Labs  07/28/13 0500 07/29/13 0400  NA 135 132*  K 4.6 5.4*  CL 98 97  CO2 31 31  GLUCOSE 83 107*  BUN 21 18  CREATININE 1.23 1.19  CALCIUM 8.5 8.7   No results found for this basename: AST, ALT, ALKPHOS, BILITOT, PROT, ALBUMIN,  in the last 72 hours No results found for this basename: LIPASE, AMYLASE,  in the last 72 hours  Recent Labs  07/28/13 0500  WBC 6.8  HGB 9.1*  HCT 28.1*  MCV 94.3  PLT 184   No results found for this basename: CKTOTAL, CKMB, TROPONINI,  in the last 72 hours No components found with this basename: POCBNP,  No results found for this basename: DDIMER,  in the last 72 hours No results found for this basename: HGBA1C,  in the last 72 hours No results found for this basename: CHOL, HDL, LDLCALC, TRIG, CHOLHDL,  in the last 72 hours No results  found for this basename: TSH, T4TOTAL, FREET3, T3FREE, THYROIDAB,  in the last 72 hours No results found for this basename: VITAMINB12, FOLATE, FERRITIN, TIBC, IRON, RETICCTPCT,  in the last 72 hours  Medications: Scheduled . aspirin EC  81 mg Oral Daily  . atorvastatin  40 mg Oral q1800  . benazepril  5 mg Oral Daily  . bisacodyl  10 mg Oral Daily   Or  . bisacodyl  10 mg Rectal Daily  . docusate sodium  200 mg Oral Daily  . furosemide  40 mg Oral Daily  . magnesium hydroxide  30 mL Oral Daily  . pantoprazole  40 mg Oral Daily  . sodium chloride  3 mL Intravenous Q12H  . warfarin  5 mg Oral q1800  . Warfarin - Physician Dosing Inpatient   Does not apply q1800     Radiology/Studies:  No results found.  INR:1.69 Will add last result for INR, ABG once components are confirmed Will add last 4 CBG results once components are confirmed  Assessment/Plan: S/P Procedure(s) (LRB): MITRAL VALVE REPAIR (MVR) (N/A) CORONARY ARTERY BYPASS GRAFTING (CABG) (N/A) MAZE (N/A) INTRAOPERATIVE TRANSESOPHAGEAL ECHOCARDIOGRAM (N/A) PATENT FORAMEN OVALE CLOSURE (N/A)  1 overall good progress, cont pulm toilet and rehab  2 appears dry and below weight, will d/c lasix at this point 3 INR conts to rise on current dose- cont 5 mg for now 4 rhythm has stabilized- d/c epw's, 5 poss d/c in the am  LOS: 10 days    GOLD,WAYNE E 8/7/20147:49 AM   Chart reviewed, patient examined, agree with above. Goal INR is around 2-2.5 but he does not have to be therapeutic prior to discharge.

## 2013-07-30 NOTE — Progress Notes (Signed)
CARDIAC REHAB PHASE I   PRE:  Rate/Rhythm: 76 SR    BP: sitting 139/68    SaO2: 96 1L  MODE:  Ambulation: 1040 ft   POST:  Rate/Rhythm: 88 SR    BP: sitting 134/82     SaO2: 87 1L  Pt tolerated very well with increased distance. Denied SOB. 2-3 spot checks for SaO2, around 87 1L, up with rest. Briefly 83 but do not feel that was accurate. Encouraged IS and more walking and sitting up.  [phone]   [name] CES, ACSM 07/30/2013 8:58 AM

## 2013-07-31 LAB — PROTIME-INR
INR: 1.76 — ABNORMAL HIGH (ref 0.00–1.49)
Prothrombin Time: 20 seconds — ABNORMAL HIGH (ref 11.6–15.2)

## 2013-07-31 MED ORDER — BENAZEPRIL HCL 10 MG PO TABS: 10.0000 mg | ORAL_TABLET | Freq: Every day | ORAL | Status: DC

## 2013-07-31 MED ORDER — ASPIRIN 81 MG PO TBEC: 81.0000 mg | DELAYED_RELEASE_TABLET | Freq: Every day | ORAL | Status: DC

## 2013-07-31 MED ORDER — TRAMADOL HCL 50 MG PO TABS: 50.0000 mg | ORAL_TABLET | Freq: Four times a day (QID) | ORAL | Status: DC | PRN

## 2013-07-31 MED ORDER — BENAZEPRIL HCL 10 MG PO TABS
10.0000 mg | ORAL_TABLET | Freq: Every day | ORAL | Status: DC
  Administered 2013-07-31: 10 mg via ORAL
  Filled 2013-07-31: qty 1

## 2013-07-31 MED ORDER — WARFARIN SODIUM 5 MG PO TABS: 5.0000 mg | ORAL_TABLET | Freq: Every day | ORAL | Status: DC

## 2013-07-31 NOTE — Progress Notes (Signed)
CARDIAC REHAB PHASE I   PRE:  Rate/Rhythm: 79 SR    BP: sitting 140/80    SaO2: 96 1L, 93 RA  MODE:  Ambulation: 1050 ft   POST:  Rate/Rhythm: 86     BP: sitting 130/76     SaO2: 83 RA, 90-94 2L  SATURATION QUALIFICATIONS: (This note is used to comply with regulatory documentation for home oxygen)  Patient Saturations on Room Air at Rest = 93%  Patient Saturations on Room Air while Ambulating = 83-88%  Patient Saturations on 2 Liters of oxygen while Ambulating = 90-94%  Please briefly explain why patient needs home oxygen:  Pt does not seem to need O2 at rest however when walking SaO2 slowly decreases. Doing well with IS (2000 mL), not very SOB walking. Increased O2 to 2L and able to maintain >90 rest of long walk. No c/o. To recliner after walk, took O2 off. Ed completed and pt requests his name be sent to [facility]  [id number] Henry Page [place] CES, ACSM 07/31/2013 9:24 AM

## 2013-07-31 NOTE — Progress Notes (Addendum)
       [address]       [place] [address]             [phone]          9 Days Post-Op Procedure(s) (LRB): MITRAL VALVE REPAIR (MVR) (N/A) CORONARY ARTERY BYPASS GRAFTING (CABG) (N/A) MAZE (N/A) INTRAOPERATIVE TRANSESOPHAGEAL ECHOCARDIOGRAM (N/A) PATENT FORAMEN OVALE CLOSURE (N/A)  Subjective: Just back from walking with cardiac rehab.  Feels well, but still requires 2L O2 when ambulating.    Objective: Vital signs in last 24 hours: Patient Vitals for the past 24 hrs:  BP Temp Temp src Pulse Resp SpO2 Weight  07/31/13 0404 128/78 mmHg 98.6 F (37 C) Oral 73 20 95 % 169 lb 1.6 oz (76.703 kg)  07/30/13 2019 138/74 mmHg 98.7 F (37.1 C) Oral 75 20 96 % -  07/30/13 1331 129/78 mmHg 97.9 F (36.6 C) Oral 82 18 92 % -  07/30/13 1100 124/70 mmHg - - 84 18 94 % -  07/30/13 1030 119/67 mmHg - - 80 - 93 % -  07/30/13 1000 120/69 mmHg - - 82 20 93 % -  07/30/13 0945 165/82 mmHg - - 78 18 93 % -  07/30/13 0930 165/82 mmHg - - 101 - 95 % -  07/30/13 0915 123/75 mmHg - - 86 18 95 % -  07/30/13 0905 137/71 mmHg - - 81 18 95 % -   Current Weight  07/31/13 169 lb 1.6 oz (76.703 kg)  PRE OP WEIGHT: 77 kg    Intake/Output from previous day: 08/07 0701 - 08/08 0700 In: 1080 [P.O.:1080] Out: 2800 [Urine:2800]    PHYSICAL EXAM:  Heart: RRR Lungs: Clear, slightly decreased BS in bases Wound: Clean and dry Extremities: No significant LE edema    Lab Results: CBC:No results found for this basename: WBC, HGB, HCT, PLT,  in the last 72 hours BMET:  Recent Labs  07/29/13 0400  NA 132*  K 5.4*  CL 97  CO2 31  GLUCOSE 107*  BUN 18  CREATININE 1.19  CALCIUM 8.7    PT/INR:  Recent Labs  07/31/13 0625  LABPROT 20.0*  INR 1.76*      Assessment/Plan: S/P Procedure(s) (LRB): MITRAL VALVE REPAIR (MVR) (N/A) CORONARY ARTERY BYPASS GRAFTING (CABG) (N/A) MAZE (N/A) INTRAOPERATIVE TRANSESOPHAGEAL ECHOCARDIOGRAM (N/A) PATENT FORAMEN OVALE  CLOSURE (N/A) CV-Rhythm stable, BPs a bit elevated overnight. Increase Lotensin, no beta blocker due to marked bradycardia/junctional rhythm early postop. Continue Coumadin. Pulm- still desats with exertion.  Will arrange home O2. Hyperkalemia- K discontinued, Cr normalized.  Will watch. Possibly home later today with home O2.    LOS: 11 days    COLLINS,GINA H 07/31/2013   Chart reviewed, patient examined, agree with above. He looks good and can go home today with oxygen as needed.

## 2013-08-03 ENCOUNTER — Telehealth: Payer: Self-pay | Admitting: Cardiovascular Disease

## 2013-08-03 NOTE — Telephone Encounter (Signed)
New Problem    [facility] and states that the PT's heart rate is  80-128 ,Beating at fast rates consistentely. Pt had a Cabx4

## 2013-08-03 NOTE — Telephone Encounter (Signed)
Returned call to patient spoke to wife she stated husband's heart beat very irregular,heart beat is ranging 80 to 128 beats/min.Patient is dizzy and weak.Dr.Cooper out of office today spoke to DOD Dr.Klein he advised to restart metoprolol tart 25 mg twice a day.Patient is taking warfarin.Advised to continue all other medications.Advised to keep post hospital appointment with Dr.Cooper 08/05/13.

## 2013-08-05 ENCOUNTER — Ambulatory Visit (INDEPENDENT_AMBULATORY_CARE_PROVIDER_SITE_OTHER): Payer: BC Managed Care – PPO | Admitting: Cardiovascular Disease

## 2013-08-05 ENCOUNTER — Encounter: Payer: Self-pay | Admitting: Cardiovascular Disease

## 2013-08-05 VITALS — BP 128/88 | HR 88 | Ht 70.0 in | Wt 167.0 lb

## 2013-08-05 DIAGNOSIS — I340 Nonrheumatic mitral (valve) insufficiency: Secondary | ICD-10-CM

## 2013-08-05 DIAGNOSIS — I4891 Unspecified atrial fibrillation: Secondary | ICD-10-CM

## 2013-08-05 DIAGNOSIS — Z9889 Other specified postprocedural states: Secondary | ICD-10-CM

## 2013-08-05 DIAGNOSIS — I2510 Atherosclerotic heart disease of native coronary artery without angina pectoris: Secondary | ICD-10-CM

## 2013-08-05 DIAGNOSIS — I059 Rheumatic mitral valve disease, unspecified: Secondary | ICD-10-CM

## 2013-08-05 MED ORDER — AMIODARONE HCL 200 MG PO TABS: 200.0000 mg | ORAL_TABLET | Freq: Two times a day (BID) | ORAL | Status: DC

## 2013-08-05 NOTE — Patient Instructions (Addendum)
Your physician has recommended you make the following change in your medication: START Amiodarone 200mg  take one by mouth twice a day  Your physician recommends that you schedule a follow-up appointment on 08/31/13 for an EKG--if you are still in Atrial Fibrillation then we will arrange Cardioversion  Your physician recommends that you schedule a follow-up appointment in: 6 WEEKS with Dr Excell Seltzer  Your physician has requested that you have an echocardiogram in 5 WEEKS. Echocardiography is a painless test that uses sound waves to create images of your heart. It provides your doctor with information about the size and shape of your heart and how well your heart's chambers and valves are working. This procedure takes approximately one hour. There are no restrictions for this procedure.

## 2013-08-05 NOTE — Progress Notes (Signed)
HPI:  67 year old gentleman presenting for followup evaluation. He has previously been followed in our office back in 2007- 2008 with mitral valve prolapse of mitral regurgitation. He was lost to followup until he was admitted in July 2014 with congestive heart failure, rapid atrial fibrillation, and severe mitral regurgitation. He underwent evaluation in anticipation of cardiac surgery. This included a heart catheterization demonstrating multivessel coronary disease and a TEE confirming severe MR with a P2 flail. He underwent surgery July 28 with 4 vessel CABG (LIMA to LAD, sequential saphenous vein graft to OM and ramus intermedius, and vein graft to RCA), mitral valve repair, Cryo-Cox-Maze, left atrial appendage clipping, and PFO closure.  The patient is now 2 weeks out from surgery. He's noticed that since his been home his heart rate has been irregular a little faster than when he was in the hospital. He otherwise is doing well and feels like he is making pretty good progress. He walked a half mile this morning. His oxygen saturations have been staying over 90% with activity. He denies chest pain, shortness of breath, or swelling. He's a little anxious about atrial fibrillation and does not want to be on long-term anticoagulation.   Outpatient Encounter Prescriptions as of 08/05/2013  Medication Sig Dispense Refill  . acetaminophen (TYLENOL) 325 MG tablet Take 2 tablets (650 mg total) by mouth every 4 (four) hours as needed.      . ALPRAZolam (XANAX) 0.5 MG tablet Take 0.5 mg by mouth 2 (two) times daily as needed for sleep.      Marland Kitchen aspirin EC 81 MG EC tablet Take 1 tablet (81 mg total) by mouth daily.      Marland Kitchen atorvastatin (LIPITOR) 40 MG tablet Take 1 tablet (40 mg total) by mouth daily at 6 PM.  30 tablet  11  . azelastine (ASTELIN) 137 MCG/SPRAY nasal spray Place 1 spray into the nose 2 (two) times daily. Use in each nostril as directed      . benazepril (LOTENSIN) 10 MG tablet Take 1 tablet (10  mg total) by mouth daily.  30 tablet  1  . desoximetasone (TOPICORT) 0.25 % cream Apply 1 application topically daily as needed.      . traMADol (ULTRAM) 50 MG tablet Take 1 tablet (50 mg total) by mouth every 6 (six) hours as needed for pain.  30 tablet  0  . warfarin (COUMADIN) 5 MG tablet Take 1 tablet (5 mg total) by mouth daily. Or as directed by Coumadin Clinic  30 tablet  2  . metoprolol tartrate (LOPRESSOR) 25 MG tablet Take one tablet twice daily      . NITROSTAT 0.4 MG SL tablet Prn for chest pain      . [DISCONTINUED] azelastine (ASTELIN) 137 MCG/SPRAY nasal spray Place 1 spray into the nose daily as needed for rhinitis. Use in each nostril as directed       No facility-administered encounter medications on file as of 08/05/2013.    Allergies  Allergen Reactions  . Meperidine And Related Itching  . Shellfish Allergy     flulike symptoms   . Sulfa Antibiotics     "very sick"    Past Medical History  Diagnosis Date  . Mitral valve prolapse 07/06/2013  . Mitral regurgitation 07/06/2013  . Atrial fibrillation 07/06/2013  . Essential hypertension, benign 07/06/2013  . 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 Heart murmur   . Sleep apnea   . Anxiety   . Shortness  of breath   . Kidney stones     some passed spontaneous, one retrieved by cystoscope   . CKD (chronic kidney disease), stage III   . S/P mitral valve repair 07/22/2013    Complex valvuloplasty including triangular resection of posterior leaflet with artificial Gore-tex neocord placement x2 and 34mm Sorin Memo 3D ring annuloplasty  . 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 S/P Maze operation for atrial fibrillation 07/22/2013    Complete bilateral atrial lesion set using bipolar radiofrequency and cryothermy ablation with clipping of LA appendage     ROS: Negative except as per HPI  BP 128/88  Pulse 88  Ht  5\' 10"  (1.778 m)  Wt 75.751 kg (167 lb)  BMI 23.96 kg/m2  SpO2 98%  PHYSICAL EXAM: Pt is alert and oriented, NAD HEENT: normal Neck: JVP - normal, carotids 2+= without bruits Lungs: CTA bilaterally Chest: Sternotomy site healing well CV: Irregularly irregular without murmur or gallop Abd: soft, NT, Positive BS, no hepatomegaly Ext: no C/C/E, distal pulses intact and equal Skin: warm/dry no rash  EKG:  Atrial fibrillation 80 beats per minute, otherwise within normal limits.  ASSESSMENT AND PLAN: 1. Severe mitral regurgitation. The patient is doing well now 2 weeks out from surgery. His exam demonstrates no murmur. Will check an echocardiogram before he follows up in 6 weeks to establish a postoperative baseline.  2. Atrial fibrillation. The patient is back in atrial fibrillation. I am going to start him on amiodarone 200 mg twice daily. He returns to see Dr. Cornelius Moras on September 8 and we will have him come to our office for an EKG at that time. If he is still in atrial fibrillation we'll plan on elective cardioversion. Home health is checking his INR's and we will verify that he has been in the therapeutic range.  3. Coronary artery disease. He is status post multivessel CABG done at the time of his mitral valve repair. No anginal symptoms at present.  4. Anticoagulation. He understands the need to stay on warfarin at least in the intermediate term. This will be indicated long-term if he continues with paroxysms of atrial fibrillation. If he requires long-term anticoagulation I will likely change him to a novel agent. Will notify the coumadin clinic that we are starting Amiodarone so that his INR is watched closely since amio can increase warfarin effect.  Tonny Bollman 08/05/2013 2:34 PM

## 2013-08-06 ENCOUNTER — Ambulatory Visit: Payer: Self-pay | Admitting: *Deleted

## 2013-08-06 ENCOUNTER — Ambulatory Visit (INDEPENDENT_AMBULATORY_CARE_PROVIDER_SITE_OTHER): Payer: BC Managed Care – PPO | Admitting: *Deleted

## 2013-08-06 DIAGNOSIS — I4891 Unspecified atrial fibrillation: Secondary | ICD-10-CM

## 2013-08-06 DIAGNOSIS — Z9889 Other specified postprocedural states: Secondary | ICD-10-CM

## 2013-08-06 DIAGNOSIS — Z7901 Long term (current) use of anticoagulants: Secondary | ICD-10-CM

## 2013-08-06 LAB — POCT INR: INR: 4

## 2013-08-07 ENCOUNTER — Telehealth: Payer: Self-pay | Admitting: Cardiovascular Disease

## 2013-08-07 NOTE — Telephone Encounter (Signed)
New Prob  Per wife pt is not feeling well today. She is wondering if it is due to the new medication that he was placed on recently.

## 2013-08-07 NOTE — Telephone Encounter (Signed)
I spoke with the pt's wife and she said today the pt is not feeling well.  The pt has lost 2 more pounds and feels weak.  BP 124/75, pulse 82, O2 sat 97%. I made her aware that this is most likely normal 2 weeks out from cardiac surgery.  The pt's H & H are low due to surgery and the pt is still recovering.  I made her aware that it is not unusual for a pt to have a "bad day" after having recent surgery.  The pt will continue current medications and continue to monitor weight. She will call the office with any other questions or concerns.

## 2013-08-10 ENCOUNTER — Ambulatory Visit (INDEPENDENT_AMBULATORY_CARE_PROVIDER_SITE_OTHER): Payer: BC Managed Care – PPO | Admitting: *Deleted

## 2013-08-10 ENCOUNTER — Telehealth: Payer: Self-pay | Admitting: Cardiovascular Disease

## 2013-08-10 DIAGNOSIS — Z9889 Other specified postprocedural states: Secondary | ICD-10-CM

## 2013-08-10 DIAGNOSIS — I4891 Unspecified atrial fibrillation: Secondary | ICD-10-CM

## 2013-08-10 DIAGNOSIS — Z7901 Long term (current) use of anticoagulants: Secondary | ICD-10-CM

## 2013-08-10 LAB — POCT INR: INR: 3.1

## 2013-08-10 NOTE — Telephone Encounter (Signed)
New problem   Henry Page/[facility] stated pt is concern about wt loss 2lb in one week is also having depression. He has oxygen that was ordered from hospital that isn't being used. They need order for discontinuing fax to [phone].

## 2013-08-11 NOTE — Telephone Encounter (Signed)
Left message on Angie's voicemail that these are normal findings after cardiac surgery. We can go ahead and fax and order to discontinue oxygen.

## 2013-08-13 ENCOUNTER — Ambulatory Visit (INDEPENDENT_AMBULATORY_CARE_PROVIDER_SITE_OTHER): Payer: BC Managed Care – PPO | Admitting: *Deleted

## 2013-08-13 DIAGNOSIS — Z9889 Other specified postprocedural states: Secondary | ICD-10-CM

## 2013-08-13 DIAGNOSIS — Z7901 Long term (current) use of anticoagulants: Secondary | ICD-10-CM

## 2013-08-13 DIAGNOSIS — I4891 Unspecified atrial fibrillation: Secondary | ICD-10-CM

## 2013-08-13 LAB — POCT INR: INR: 2.5

## 2013-08-13 NOTE — Telephone Encounter (Signed)
F/up  Letter to stop oxygen has not been received// pt is not sing it and they are still being charge// please call back.

## 2013-08-13 NOTE — Telephone Encounter (Signed)
Order faxed to discontinue home oxygen therapy. Angie aware.

## 2013-08-17 DIAGNOSIS — I2510 Atherosclerotic heart disease of native coronary artery without angina pectoris: Secondary | ICD-10-CM

## 2013-08-18 ENCOUNTER — Ambulatory Visit (INDEPENDENT_AMBULATORY_CARE_PROVIDER_SITE_OTHER): Payer: BC Managed Care – PPO | Admitting: *Deleted

## 2013-08-18 DIAGNOSIS — Z9889 Other specified postprocedural states: Secondary | ICD-10-CM

## 2013-08-18 DIAGNOSIS — Z7901 Long term (current) use of anticoagulants: Secondary | ICD-10-CM

## 2013-08-18 DIAGNOSIS — I4891 Unspecified atrial fibrillation: Secondary | ICD-10-CM

## 2013-08-18 LAB — POCT INR: INR: 3.3

## 2013-08-20 ENCOUNTER — Encounter: Payer: Self-pay | Admitting: Cardiovascular Disease

## 2013-08-20 ENCOUNTER — Telehealth: Payer: Self-pay | Admitting: Cardiovascular Disease

## 2013-08-20 DIAGNOSIS — I4891 Unspecified atrial fibrillation: Secondary | ICD-10-CM

## 2013-08-20 NOTE — Telephone Encounter (Signed)
I spoke with the pt's wife and the pt's BP at this time is 171/81. The pt has already taken his Benazepril today and I instructed her to have the pt take an additional Benazepril 10mg  at this time. She will continue to monitor the pt's BP.  I will forward this message to Dr Excell Seltzer for further recommendations.

## 2013-08-20 NOTE — Telephone Encounter (Signed)
Patient's wife called wanting someone to call her back in reference to his BP reading today..  161/92  177/99

## 2013-08-20 NOTE — Telephone Encounter (Signed)
This encounter was created in error - please disregard.

## 2013-08-20 NOTE — Telephone Encounter (Signed)
New Prob     States pt BP has been high lately and would like to speak to a nurse regarding this.

## 2013-08-21 ENCOUNTER — Encounter: Payer: Self-pay | Admitting: Cardiovascular Disease

## 2013-08-21 MED ORDER — HYDROCHLOROTHIAZIDE 25 MG PO TABS: 25.0000 mg | ORAL_TABLET | Freq: Every day | ORAL | Status: DC

## 2013-08-21 NOTE — Addendum Note (Signed)
Addended by: Iona Coach on: 08/21/2013 12:05 PM   Modules accepted: Orders

## 2013-08-21 NOTE — Telephone Encounter (Signed)
Per Dr Excell Seltzer the pt can start HCTZ 25mg  daily in addition to his current medications. The earlier BP of 171/103 was prior to the pt's medications.  Currently the pt's BP is 134/75.  The pt's pulse is also better and running in the upper 50"s. I will send Rx to [facility].

## 2013-08-21 NOTE — Telephone Encounter (Signed)
New Problem  Pt bp as of this morning.   171/103   Request a call back.

## 2013-08-21 NOTE — Telephone Encounter (Addendum)
I did make the pt's wife aware that if the pt's SBP remains below 140 then the pt can hold off on starting HCTZ and continue to monitor BP.  The pt will also need labs checked next week. (BMP and CBC)

## 2013-08-21 NOTE — Telephone Encounter (Deleted)
New Problem  Pt bp as of this morning.   171/103   Pt request a call back.

## 2013-08-21 NOTE — Telephone Encounter (Deleted)
Error. Duplicate message

## 2013-08-21 NOTE — Telephone Encounter (Signed)
This encounter was created in error - please disregard.

## 2013-08-22 ENCOUNTER — Telehealth: Payer: Self-pay | Admitting: Internal Medicine

## 2013-08-22 NOTE — Telephone Encounter (Signed)
I talked with ms Villafranca about her husbands BP and medications schedule. They are concerned that the BP is higher in the afternoon, around 150's systolic. I told he that they could take the HCTZ at noon instead of at night.

## 2013-08-25 ENCOUNTER — Ambulatory Visit (INDEPENDENT_AMBULATORY_CARE_PROVIDER_SITE_OTHER): Payer: BC Managed Care – PPO | Admitting: *Deleted

## 2013-08-25 VITALS — BP 130/84 | HR 62 | Resp 18

## 2013-08-25 DIAGNOSIS — Z7901 Long term (current) use of anticoagulants: Secondary | ICD-10-CM

## 2013-08-25 DIAGNOSIS — I4891 Unspecified atrial fibrillation: Secondary | ICD-10-CM

## 2013-08-25 DIAGNOSIS — Z9889 Other specified postprocedural states: Secondary | ICD-10-CM

## 2013-08-25 DIAGNOSIS — I480 Paroxysmal atrial fibrillation: Secondary | ICD-10-CM

## 2013-08-25 LAB — BASIC METABOLIC PANEL
Calcium: 9.1 mg/dL (ref 8.4–10.5)
Creatinine, Ser: 1.6 mg/dL — ABNORMAL HIGH (ref 0.4–1.5)
GFR: 45.1 mL/min — ABNORMAL LOW (ref >60.00)

## 2013-08-25 LAB — CBC WITH DIFFERENTIAL/PLATELET
Basophils Relative: 0.4 % (ref 0.0–3.0)
Eosinophils Absolute: 0 10*3/uL (ref 0.0–0.7)
Lymphocytes Relative: 26.4 % (ref 12.0–46.0)
MCHC: 32.8 g/dL (ref 30.0–36.0)
Neutrophils Relative %: 66.4 % (ref 43.0–77.0)
RBC: 4.21 Mil/uL — ABNORMAL LOW (ref 4.22–5.81)
WBC: 8.7 10*3/uL (ref 4.5–10.5)

## 2013-08-25 LAB — POCT INR: INR: 3.2

## 2013-08-25 NOTE — Patient Instructions (Signed)
Patient aware to keep appointments with cardiac surgeon and Dr.Cooper.

## 2013-08-25 NOTE — Progress Notes (Signed)
Patient here for EKG pre cardioversion. He is in normal sinus rhythm today/vent rate of 62. Advised patient that he does not need to be set up for a cardioversion at this time. Patient aware to stay on Amiodarone 200mg  BID and HCTZ 25mg  every day and Coumadin along with his other medications and follow up with Dr.Cooper on 10/1 at 10am.

## 2013-08-27 ENCOUNTER — Other Ambulatory Visit: Payer: Self-pay | Admitting: *Deleted

## 2013-08-27 DIAGNOSIS — I059 Rheumatic mitral valve disease, unspecified: Secondary | ICD-10-CM

## 2013-08-27 DIAGNOSIS — I2510 Atherosclerotic heart disease of native coronary artery without angina pectoris: Secondary | ICD-10-CM

## 2013-08-31 ENCOUNTER — Encounter: Payer: Self-pay | Admitting: Thoracic Surgery (Cardiothoracic Vascular Surgery)

## 2013-08-31 ENCOUNTER — Ambulatory Visit
Admission: RE | Admit: 2013-08-31 | Discharge: 2013-08-31 | Disposition: A | Discharge status: Home / Self Care | Payer: BC Managed Care – PPO | Source: Ambulatory Visit | Attending: Thoracic Surgery (Cardiothoracic Vascular Surgery) | Admitting: Thoracic Surgery (Cardiothoracic Vascular Surgery)

## 2013-08-31 ENCOUNTER — Ambulatory Visit (INDEPENDENT_AMBULATORY_CARE_PROVIDER_SITE_OTHER): Payer: Self-pay | Admitting: Thoracic Surgery (Cardiothoracic Vascular Surgery)

## 2013-08-31 ENCOUNTER — Ambulatory Visit: Payer: Self-pay | Admitting: Thoracic Surgery (Cardiothoracic Vascular Surgery)

## 2013-08-31 VITALS — BP 159/98 | HR 60 | Resp 20 | Ht 70.0 in | Wt 168.0 lb

## 2013-08-31 DIAGNOSIS — I2510 Atherosclerotic heart disease of native coronary artery without angina pectoris: Secondary | ICD-10-CM

## 2013-08-31 DIAGNOSIS — I059 Rheumatic mitral valve disease, unspecified: Secondary | ICD-10-CM

## 2013-08-31 DIAGNOSIS — I340 Nonrheumatic mitral (valve) insufficiency: Secondary | ICD-10-CM

## 2013-08-31 DIAGNOSIS — I4891 Unspecified atrial fibrillation: Secondary | ICD-10-CM

## 2013-08-31 DIAGNOSIS — Z8774 Personal history of (corrected) congenital malformations of heart and circulatory system: Secondary | ICD-10-CM

## 2013-08-31 DIAGNOSIS — Z9889 Other specified postprocedural states: Secondary | ICD-10-CM

## 2013-08-31 DIAGNOSIS — Z951 Presence of aortocoronary bypass graft: Secondary | ICD-10-CM

## 2013-08-31 DIAGNOSIS — Z8679 Personal history of other diseases of the circulatory system: Secondary | ICD-10-CM

## 2013-08-31 MED ORDER — AMIODARONE HCL 200 MG PO TABS: 200.0000 mg | ORAL_TABLET | Freq: Every day | ORAL | Status: DC

## 2013-08-31 NOTE — Progress Notes (Signed)
[address]       [name] [address]             [phone]     CARDIOTHORACIC SURGERY OFFICE NOTE  Referring Provider is Pricilla Riffle, MD PCP is Ignatius Specking., MD   HPI:  Patient returns for routine followup status post mitral valve repair, coronary artery bypass grafting x4, Maze procedure, and closure of patent foramen ovale on 07/22/2013. His postoperative recovery has been uncomplicated. Since hospital discharge the patient has continued to do exceptionally well.  He was seen in followup by Dr. Excell Seltzer on 08/06/2013 and he is scheduled to undergo a routine followup echocardiogram later this month. His prothrombin time has been monitored carefully and his Coumadin dose adjusted through the Coumadin clinic. Functionally the patient has enjoyed a fairly rapid recovery. He states that his breathing is already considerably better than it was prior to surgery. He has minimal residual soreness in his chest and he is no longer taking any sort of pain relievers. Early after hospital discharge she states that his pulse was occasionally irregular, but for the last several weeks it has remained remarkably regular. He is exercising regularly and walking more than a mile every morning. He monitors his pulse and oxygen level carefully. Overall he is delighted with how he feels at this point and he is eager to increase his physical activity further. He has no complaints.   Current Outpatient Prescriptions  Medication Sig Dispense Refill  . acetaminophen (TYLENOL) 325 MG tablet Take 2 tablets (650 mg total) by mouth every 4 (four) hours as needed.      Marland Kitchen amiodarone (PACERONE) 200 MG tablet Take 1 tablet (200 mg total) by mouth 2 (two) times daily.  60 tablet  6  . aspirin EC 81 MG EC tablet Take 1 tablet (81 mg total) by mouth daily.      Marland Kitchen atorvastatin (LIPITOR) 40 MG tablet Take 1 tablet (40 mg total) by mouth daily at 6 PM.  30 tablet  11  . benazepril (LOTENSIN) 10 MG tablet  Take 1 tablet (10 mg total) by mouth daily.  30 tablet  1  . hydrochlorothiazide (HYDRODIURIL) 25 MG tablet Take 1 tablet (25 mg total) by mouth daily.  90 tablet  3  . metoprolol tartrate (LOPRESSOR) 25 MG tablet Take one tablet twice daily      . warfarin (COUMADIN) 5 MG tablet Take 1 tablet (5 mg total) by mouth daily. Or as directed by Coumadin Clinic  30 tablet  2   No current facility-administered medications for this visit.      Physical Exam:   BP 159/98  Pulse 60  Resp 20  Ht 5\' 10"  (1.778 m)  Wt 168 lb (76.204 kg)  BMI 24.11 kg/m2  SpO2 98%  General:  Well-appearing  Chest:   Clear to auscultation with symmetrical breath sounds  CV:   Regular rate and rhythm without murmur  Incisions:  Healing nicely, sternum is stable  Abdomen:  Soft and nontender  Extremities:  Warm and well-perfused with no lower extremity edema  Diagnostic Tests:  2 channel telemetry rhythm strip demonstrates normal sinus rhythm  *RADIOLOGY REPORT*  Clinical Data: Recent heart surgery.  CHEST - 2 VIEW  Comparison: 07/26/2013.  Findings: Trachea is midline. Heart size stable. Sternotomy wires  are unchanged in position. Right IJ central line and epicardial  pacer wires have been removed in the interval. Minimal residual  bibasilar atelectasis or  scarring. No edema. No pneumothorax. No  pleural fluid. Degenerative changes are seen in the spine.  IMPRESSION:  Minimal residual bibasilar atelectasis or scarring.  Original Report Authenticated By: Leanna Battles, M.D.    Impression:  Patient is doing exceptionally well less than 6 weeks following mitral valve repair, coronary artery bypass grafting, Maze procedure, and closure of patent foramen ovale. He is maintaining sinus rhythm and experiencing a fairly rapid physical recovery.  Plan:  I've encouraged patient to continue to increase his physical activity as tolerated with his primary limitation at this point remaining that he refrain from  heavy lifting or strenuous use of his arms or shoulders for another 6-8 weeks. I think he can return to work October 1 including plans for travel as he is accustomed.  I've instructed him to decrease his dose of amiodarone to 200 mg daily. I would not refill his prescription when his current amiodarone prescription runs out. We will plan to see him back in 2 months for routine followup and rhythm check.   Salvatore Decent. Cornelius Moras, MD 08/31/2013 11:44 AM

## 2013-08-31 NOTE — Patient Instructions (Signed)
The patient should continue to avoid any heavy lifting or strenuous use of arms or shoulders for at least a total of three months from the time of surgery.  The patient may return to driving an automobile as long as they are no longer requiring oral narcotic pain relievers during the daytime.  It would be wise to start driving only short distances during the daylight and gradually increase from there as they feel comfortable.  The patient may return to work as long as they can refrain from any heavy lifting or strenuous use of their arms or shoulders for at least 3 months from the time of surgery.  The patient is to decrease their dose of Amiodarone to 200 mg daily until their current prescription runs out, then stop taking it altogether.

## 2013-09-01 ENCOUNTER — Ambulatory Visit (INDEPENDENT_AMBULATORY_CARE_PROVIDER_SITE_OTHER): Payer: BC Managed Care – PPO | Admitting: *Deleted

## 2013-09-01 DIAGNOSIS — Z7901 Long term (current) use of anticoagulants: Secondary | ICD-10-CM

## 2013-09-01 DIAGNOSIS — Z9889 Other specified postprocedural states: Secondary | ICD-10-CM

## 2013-09-01 DIAGNOSIS — I4891 Unspecified atrial fibrillation: Secondary | ICD-10-CM

## 2013-09-03 ENCOUNTER — Telehealth: Payer: Self-pay | Admitting: Physician Assistant

## 2013-09-03 NOTE — Telephone Encounter (Signed)
Pt accidentally took pm medications this am. Wife was not sure what to do this pm. No symptoms or other concerns.  Reviewed medications. No issues with most meds, but I did request she hold the benazepril if his SBP is < 130 since he will take it again in the morning. Wife stated she would do so.

## 2013-09-11 ENCOUNTER — Ambulatory Visit (INDEPENDENT_AMBULATORY_CARE_PROVIDER_SITE_OTHER): Payer: BC Managed Care – PPO | Admitting: *Deleted

## 2013-09-11 DIAGNOSIS — Z7901 Long term (current) use of anticoagulants: Secondary | ICD-10-CM

## 2013-09-11 DIAGNOSIS — Z9889 Other specified postprocedural states: Secondary | ICD-10-CM

## 2013-09-11 DIAGNOSIS — I4891 Unspecified atrial fibrillation: Secondary | ICD-10-CM

## 2013-09-11 LAB — POCT INR: INR: 3.9

## 2013-09-16 ENCOUNTER — Ambulatory Visit (HOSPITAL_COMMUNITY): Payer: BC Managed Care – PPO | Attending: Cardiovascular Disease | Admitting: Radiology

## 2013-09-16 DIAGNOSIS — I509 Heart failure, unspecified: Secondary | ICD-10-CM | POA: Insufficient documentation

## 2013-09-16 DIAGNOSIS — I079 Rheumatic tricuspid valve disease, unspecified: Secondary | ICD-10-CM | POA: Insufficient documentation

## 2013-09-16 DIAGNOSIS — I379 Nonrheumatic pulmonary valve disorder, unspecified: Secondary | ICD-10-CM | POA: Insufficient documentation

## 2013-09-16 DIAGNOSIS — I059 Rheumatic mitral valve disease, unspecified: Secondary | ICD-10-CM

## 2013-09-16 DIAGNOSIS — Z951 Presence of aortocoronary bypass graft: Secondary | ICD-10-CM

## 2013-09-16 DIAGNOSIS — I340 Nonrheumatic mitral (valve) insufficiency: Secondary | ICD-10-CM

## 2013-09-16 DIAGNOSIS — I341 Nonrheumatic mitral (valve) prolapse: Secondary | ICD-10-CM

## 2013-09-16 DIAGNOSIS — I4891 Unspecified atrial fibrillation: Secondary | ICD-10-CM | POA: Insufficient documentation

## 2013-09-16 DIAGNOSIS — Z9889 Other specified postprocedural states: Secondary | ICD-10-CM

## 2013-09-16 DIAGNOSIS — I2510 Atherosclerotic heart disease of native coronary artery without angina pectoris: Secondary | ICD-10-CM | POA: Insufficient documentation

## 2013-09-16 NOTE — Progress Notes (Signed)
Echocardiogram performed.  

## 2013-09-19 ENCOUNTER — Telehealth: Payer: Self-pay | Admitting: Physician Assistant

## 2013-09-19 ENCOUNTER — Other Ambulatory Visit: Payer: Self-pay | Admitting: Thoracic Surgery (Cardiothoracic Vascular Surgery)

## 2013-09-19 NOTE — Telephone Encounter (Signed)
Patient's wife called with questions about her husband's medication. She is confused about the amiodarone. She thought he was supposed to stop it once his current bottle ran out. But, a refill was sent to the pharmacy. They have a new bottle with 30 tabs. I reviewed Dr. Orvan July note. I explained to her that it appears the plan was to finish out his previous bottle and stop. I have asked her to keep him on the Amiodarone and call Dr. Orvan July office Monday to confirm. Tereso Newcomer, PA-C   09/19/2013 2:08 PM

## 2013-09-21 ENCOUNTER — Other Ambulatory Visit: Payer: Self-pay | Admitting: Physician Assistant

## 2013-09-21 ENCOUNTER — Telehealth: Payer: Self-pay | Admitting: *Deleted

## 2013-09-21 ENCOUNTER — Other Ambulatory Visit: Payer: Self-pay

## 2013-09-21 MED ORDER — BENAZEPRIL HCL 10 MG PO TABS: 10.0000 mg | ORAL_TABLET | Freq: Every day | ORAL | Status: DC

## 2013-09-21 NOTE — Telephone Encounter (Addendum)
Reviewed with Dr. Cornelius Moras. CAN LUCCI can stop Amiodarone now. Signed,  Tereso Newcomer, PA-C   09/21/2013 4:54 PM

## 2013-09-21 NOTE — Telephone Encounter (Signed)
wife notified about ok for pt to d/c amiodarone as of today per Dr. Cornelius Moras and Tereso Newcomer, PA

## 2013-09-23 ENCOUNTER — Ambulatory Visit (INDEPENDENT_AMBULATORY_CARE_PROVIDER_SITE_OTHER): Payer: BC Managed Care – PPO | Admitting: Cardiovascular Disease

## 2013-09-23 ENCOUNTER — Encounter: Payer: Self-pay | Admitting: Cardiovascular Disease

## 2013-09-23 VITALS — BP 142/74 | HR 64 | Wt 168.0 lb

## 2013-09-23 DIAGNOSIS — Z9889 Other specified postprocedural states: Secondary | ICD-10-CM

## 2013-09-23 DIAGNOSIS — I4891 Unspecified atrial fibrillation: Secondary | ICD-10-CM

## 2013-09-23 DIAGNOSIS — I2510 Atherosclerotic heart disease of native coronary artery without angina pectoris: Secondary | ICD-10-CM

## 2013-09-23 NOTE — Patient Instructions (Addendum)
Your physician recommends that you continue on your current medications as directed. Please refer to the Current Medication list given to you today.  YOU CAN STOP COUMADIN AT THE END OF October.  Your physician wants you to follow-up in: January 2015 with Dr Excell Seltzer.  You will receive a reminder letter in the mail two months in advance. If you don't receive a letter, please call our office to schedule the follow-up appointment.

## 2013-09-23 NOTE — Progress Notes (Signed)
HPI:  67 year old gentleman presenting for followup evaluation. The patient has been followed for coronary artery disease, mitral regurgitation, and atrial fibrillation. He underwent 4 vessel CABG, mitral valve repair, and Maze procedure 07/22/2013. The patient is feeling very well. He is walking 2 miles every day without exertional symptoms. He monitors his heart rate regularly. He has not appreciated any irregularity to his rhythm. He denies shortness of breath, palpitations, edema, or chest pain.  Outpatient Encounter Prescriptions as of 09/23/2013  Medication Sig Dispense Refill  . acetaminophen (TYLENOL) 325 MG tablet Take 2 tablets (650 mg total) by mouth every 4 (four) hours as needed.      . ALPRAZolam (XANAX) 0.5 MG tablet as needed.      Marland Kitchen aspirin EC 81 MG EC tablet Take 1 tablet (81 mg total) by mouth daily.      Marland Kitchen atorvastatin (LIPITOR) 40 MG tablet Take 1 tablet (40 mg total) by mouth daily at 6 PM.  30 tablet  11  . benazepril (LOTENSIN) 10 MG tablet Take 1 tablet (10 mg total) by mouth daily.  30 tablet  6  . hydrochlorothiazide (HYDRODIURIL) 25 MG tablet Take 1 tablet (25 mg total) by mouth daily.  90 tablet  3  . metoprolol tartrate (LOPRESSOR) 25 MG tablet Take one tablet twice daily      . warfarin (COUMADIN) 5 MG tablet as directed by [facility]in Clinic      . [DISCONTINUED] warfarin (COUMADIN) 5 MG tablet Take 1 tablet (5 mg total) by mouth daily. Or as directed by Coumadin Clinic  30 tablet  2   No facility-administered encounter medications on file as of 09/23/2013.    Allergies  Allergen Reactions  . Meperidine And Related Itching  . Shellfish Allergy     flulike symptoms   . Sulfa Antibiotics     "very sick"    Past Medical History  Diagnosis Date  . Mitral valve prolapse 07/06/2013  . Mitral regurgitation 07/06/2013  . Atrial fibrillation 07/06/2013  . Essential hypertension, benign 07/06/2013  . Acute combined systolic and diastolic congestive heart failure  07/06/2013  . Heart murmur   . Sleep apnea   . Anxiety   . Shortness of breath   . Kidney stones     some passed spontaneous, one retrieved by cystoscope   . CKD (chronic kidney disease), stage III   . S/P mitral valve repair 07/22/2013    Complex valvuloplasty including triangular resection of posterior leaflet with artificial Gore-tex neocord placement x2 and 34mm Sorin Memo 3D ring annuloplasty  . 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 S/P Maze operation for atrial fibrillation 07/22/2013    Complete bilateral atrial lesion set using bipolar radiofrequency and cryothermy ablation with clipping of LA appendage    ROS: Negative except as per HPI  BP 142/74  Pulse 64  Wt 76.204 kg (168 lb)  BMI 24.11 kg/m2  PHYSICAL EXAM: Pt is alert and oriented, NAD HEENT: normal Neck: JVP - normal, carotids 2+= without bruits Lungs: CTA bilaterally CV: RRR without murmur or gallop Abd: soft, NT, Positive BS, no hepatomegaly Ext: no C/C/E, distal pulses intact and equal Skin: warm/dry no rash  EKG:  Sinus bradycardia 57 beats per minute, nonspecific ST abnormality.  2-D echocardiogram 09/16/2013: Study Conclusions  - Left ventricle: The cavity size was normal. There was mild concentric hypertrophy. Systolic function was normal.  The estimated ejection fraction was in the range of 50% to 55%. Wall motion was normal; there were no regional wall motion abnormalities. Features are consistent with a pseudonormal left ventricular filling pattern, with concomitant abnormal relaxation and increased filling pressure (grade 2 diastolic dysfunction). - Ventricular septum: Septal motion showed dyssynergy. These changes are consistent with a post-thoracotomy state. - Aorta: Ascending aortic diameter: 40mm (S). - Mitral valve: Prior procedures included surgical repair. Trivial regurgitation. Mean gradient: 3mm Hg (D).  Valve area by pressure half-time: 2.37cm^2. - Left atrium: The atrium was mildly dilated. - Tricuspid valve: Trivial regurgitation. - Pulmonary arteries: PA peak pressure: 32mm Hg (S).  ASSESSMENT AND PLAN: 1. Mitral regurgitation status post mitral valve repair. His echocardiogram was reviewed and his repair is intact without significant mitral regurgitation. The transvalvular gradients are within normal range. I would like to see him back in followup in 3 months.  2. Atrial fibrillation. The patient is maintaining sinus rhythm after his Maze procedure. I would like him to continue warfarin for another month which was taken up to 3 months from surgery. I think at that point as long as he is in sinus rhythm he can stop warfarin. He should remain on aspirin 81 mg.  3. Hypertension. Blood pressure is controlled on benazepril, hydrochlorothiazide, and metoprolol  4. Hyperlipidemia. The patient is on atorvastatin 40 mg. I asked him to try to get in with his primary care physician in the next few months to have a lipid panel as well as a metabolic panel checked.  Overall he is making excellent progress. He will return to work. A note was written for him today. I will see him back in 3 months. He returns to see Dr. Cornelius Moras next month.  Tonny Bollman 09/23/2013 10:44 AM

## 2013-10-01 ENCOUNTER — Telehealth: Payer: Self-pay | Admitting: Cardiovascular Disease

## 2013-10-01 NOTE — Telephone Encounter (Signed)
New Problem:  Pt's wife states the pt's BP is low. 102/61 and 88/59 were both taken last night. Pt's wife would like to be advised

## 2013-10-01 NOTE — Telephone Encounter (Signed)
Agree with plan as outlined.

## 2013-10-01 NOTE — Telephone Encounter (Signed)
I spoke with the pt's wife about the pt's BP being low yesterday.  The pt in general did not feel well yesterday and he did have dizziness when changing position quickly.  They spoke with the on-call staff and no changes were recommended.  The pt did not check his BP this morning and is currently at work.  I advised that the pt remain well hydrated, hold HCTZ at this time and monitor BP.  If the pt's SBP is above 120 then the pt can resume HCTZ at a lower dose of 12.5mg  daily. If SBP reaches 140 and above then the pt will resume HCTZ 25mg  daily. Pt's wife agreed with plan and will call the office with any other questions or concerns.

## 2013-10-06 ENCOUNTER — Ambulatory Visit (INDEPENDENT_AMBULATORY_CARE_PROVIDER_SITE_OTHER): Payer: BC Managed Care – PPO | Admitting: *Deleted

## 2013-10-06 DIAGNOSIS — I4891 Unspecified atrial fibrillation: Secondary | ICD-10-CM

## 2013-10-06 DIAGNOSIS — Z9889 Other specified postprocedural states: Secondary | ICD-10-CM

## 2013-10-06 DIAGNOSIS — Z7901 Long term (current) use of anticoagulants: Secondary | ICD-10-CM

## 2013-10-29 ENCOUNTER — Other Ambulatory Visit: Payer: Self-pay

## 2013-10-30 ENCOUNTER — Other Ambulatory Visit: Payer: Self-pay | Admitting: *Deleted

## 2013-11-02 ENCOUNTER — Ambulatory Visit (INDEPENDENT_AMBULATORY_CARE_PROVIDER_SITE_OTHER): Payer: BC Managed Care – PPO | Admitting: Thoracic Surgery (Cardiothoracic Vascular Surgery)

## 2013-11-02 ENCOUNTER — Encounter: Payer: Self-pay | Admitting: Thoracic Surgery (Cardiothoracic Vascular Surgery)

## 2013-11-02 VITALS — BP 184/90 | HR 56 | Resp 20 | Ht 70.0 in | Wt 168.0 lb

## 2013-11-02 DIAGNOSIS — Z8679 Personal history of other diseases of the circulatory system: Secondary | ICD-10-CM

## 2013-11-02 DIAGNOSIS — Z951 Presence of aortocoronary bypass graft: Secondary | ICD-10-CM

## 2013-11-02 DIAGNOSIS — Z9889 Other specified postprocedural states: Secondary | ICD-10-CM

## 2013-11-02 NOTE — Patient Instructions (Signed)
Patient may resume normal physical activity without any particular limitations at this time.   Endocarditis is a potentially serious infection of heart valves or inside lining of the heart.  It occurs more commonly in patients with diseased heart valves (such as patient's with aortic or mitral valve disease) and in patients who have undergone heart valve repair or replacement.  Certain surgical and dental procedures may put you at risk, such as dental cleaning, other dental procedures, or any surgery involving the respiratory, urinary, gastrointestinal tract, gallbladder or prostate gland.   To minimize your chances for develooping endocarditis, maintain good oral health and seek prompt medical attention for any infections involving the mouth, teeth, gums, skin or urinary tract.  Always notify your doctor or dentist about your underlying heart valve condition before having any invasive procedures. You will need to take antibiotics before certain procedures.        

## 2013-11-02 NOTE — Progress Notes (Signed)
[address]       [place] [address]             [phone]     CARDIOTHORACIC SURGERY OFFICE NOTE  Primary Cardiologist is Henry Bollman, MD Referring Provider is Henry Riffle, MD PCP is Henry Page., MD   HPI:  Patient returns for routine followup status post mitral valve repair, coronary artery bypass grafting x4, Maze procedure, and closure of patent foramen ovale on 07/22/2013. His postoperative recovery has been uncomplicated. He was last seen in our office on 08/31/2013 and since then he has been seen in followup by Dr. Excell Page on 09/23/2013.  He continues to maintain sinus rhythm and he recently was taken off of Coumadin. The patient reports that he is doing exceptionally well. He complains that 2 weeks ago he did with his ankle and this has limited how much he has been walking. Prior that he was walking to and after mild everyday without problem. He notes that his breathing in nature size tolerance is dramatically better than it was prior to surgery.  He is not having any shortness of breath. He is not having any tachypalpitations or dizzy spells. He states that his strength has not completely back to baseline but overall he is making excellent progress.    Current Outpatient Prescriptions  Medication Sig Dispense Refill  . ALPRAZolam (XANAX) 0.5 MG tablet as needed.      Marland Kitchen aspirin EC 81 MG EC tablet Take 1 tablet (81 mg total) by mouth daily.      Marland Kitchen atorvastatin (LIPITOR) 40 MG tablet Take 1 tablet (40 mg total) by mouth daily at 6 PM.  30 tablet  11  . benazepril (LOTENSIN) 10 MG tablet Take 1 tablet (10 mg total) by mouth daily.  30 tablet  6  . hydrochlorothiazide (HYDRODIURIL) 25 MG tablet ON HOLD 10/01/13  90 tablet  3  . metoprolol tartrate (LOPRESSOR) 25 MG tablet Take one tablet twice daily       No current facility-administered medications for this visit.      Physical Exam:   BP 184/90  Pulse 56  Resp 20  Ht 5\' 10"  (1.778 m)  Wt  168 lb (76.204 kg)  BMI 24.11 kg/m2  SpO2 98%  General:  Well-appearing  Chest:   Clear to auscultation  CV:   Regular rate and rhythm without murmur  Incisions:  Completely healed, sternum is stable  Abdomen:  Soft and nontender  Extremities:  Warm and well-perfused with no lower extremity edema  Diagnostic Tests:  2 channel telemetry rhythm strip demonstrates normal sinus rhythm    Transthoracic Echocardiography  Patient: Henry, Page MR #: [id number] Study Date: 09/16/2013 Gender: M Age: 67 Height: 177.8cm Weight: 75.8kg BSA: 1.53m^2 Pt. Status: Room:  ATTENDING Henry Page, Henry Page PERFORMING Henry Page, [facility] SONOGRAPHER Henry Page, RDCS cc:  ------------------------------------------------------------ LV EF: 50% - 55%  ------------------------------------------------------------ Indications: 424.0 Mitral valve disease. 427.31 Atrial Fibrillation. 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  ------------------------------------------------------------ History: PMH: History of mitral valve prolapse with severe mitral regurgitation. Acquired from the patient and from the patient's chart. Atrial fibrillation. Coronary artery disease. Congestive heart failure. Mitral valve disease, post annuloplasty.  ------------------------------------------------------------ Study Conclusions  - Left ventricle: The cavity size was normal. There was mild concentric hypertrophy. Systolic function was normal. The estimated ejection fraction was in the range of 50% to 55%. Wall motion was normal; there were no  regional wall motion abnormalities. Features are consistent with a pseudonormal left ventricular filling pattern, with concomitant abnormal relaxation and increased filling pressure (grade 2 diastolic dysfunction). - Ventricular septum: Septal motion showed dyssynergy. These changes are consistent with a post-thoracotomy state. -  Aorta: Ascending aortic diameter: 40mm (S). - Mitral valve: Prior procedures included surgical repair. Trivial regurgitation. Mean gradient: 3mm Hg (D). Valve area by pressure half-time: 2.37cm^2. - Left atrium: The atrium was mildly dilated. - Tricuspid valve: Trivial regurgitation. - Pulmonary arteries: PA peak pressure: 32mm Hg (S).  ------------------------------------------------------------ Labs, prior tests, procedures, and surgery: Transesophageal echocardiography (July 2014). The mitral valve showed moderate to severe regurgitation. EF was 50%. Flail P2 of the mitral valve.  Coronary artery bypass grafting (July 2014). Mitral annuloplasty with a 30mm Sorin ring. Maze procedure, PFO closure, and left atrial appendage clipping. Transthoracic echocardiography. M-mode, complete 2D, spectral Doppler, and color Doppler. Height: Height: 177.8cm. Height: 70in. Weight: Weight: 75.8kg. Weight: 166.7lb. Body mass index: BMI: 24kg/m^2. Body surface area: BSA: 1.53m^2. Blood pressure: 128/88. Patient status: Outpatient. Location: [facility]  ------------------------------------------------------------  ------------------------------------------------------------ Left ventricle: The cavity size was normal. There was mild concentric hypertrophy. Systolic function was normal. The estimated ejection fraction was in the range of 50% to 55%. Wall motion was normal; there were no regional wall motion abnormalities. Features are consistent with a pseudonormal left ventricular filling pattern, with concomitant abnormal relaxation and increased filling pressure (grade 2 diastolic dysfunction).  ------------------------------------------------------------ Aortic valve: Trileaflet; normal thickness, mildly calcified leaflets. Mobility was not restricted. Doppler: Transvalvular velocity was within the normal range. There was no stenosis. No  regurgitation.  ------------------------------------------------------------ Aorta: Aortic root: The aortic root was normal in size.  ------------------------------------------------------------ Mitral valve: Well visualized. Prior procedures included surgical repair. Mobility was not restricted. Doppler: Transvalvular velocity was within the normal range. There was no evidence for stenosis. Trivial regurgitation. Valve area by pressure half-time: 2.37cm^2. Indexed valve area by pressure half-time: 1.23cm^2/m^2. Valve area by continuity equation (using LVOT flow): 2.06cm^2. Indexed valve area by continuity equation (using LVOT flow): 1.07cm^2/m^2. Mean gradient: 3mm Hg (D). Peak gradient: 9mm Hg (D).  ------------------------------------------------------------ Left atrium: The atrium was mildly dilated.  ------------------------------------------------------------ Right ventricle: The cavity size was normal. Wall thickness was normal. Systolic function was normal.  ------------------------------------------------------------ Ventricular septum: Septal motion showed dyssynergy. These changes are consistent with a post-thoracotomy state.  ------------------------------------------------------------ Pulmonic valve: Structurally normal valve. Cusp separation was normal. Doppler: Transvalvular velocity was within the normal range. There was no evidence for stenosis. Trivial regurgitation.  ------------------------------------------------------------ Tricuspid valve: Structurally normal valve. Leaflet separation was normal. Doppler: Transvalvular velocity was within the normal range. Trivial regurgitation.  ------------------------------------------------------------ Pulmonary artery: The main pulmonary artery was normal-sized. Systolic pressure was within the normal range.  ------------------------------------------------------------ Right atrium: The atrium was normal in  size.  ------------------------------------------------------------ Pericardium: The pericardium was normal in appearance. There was no pericardial effusion.  ------------------------------------------------------------ Systemic veins: Inferior vena cava: Not visualized.  ------------------------------------------------------------  2D measurements Normal Doppler measurements Normal Left ventricle Main pulmonary LVID ED, 50.5 mm 43-52 artery chord, Pressure, 32 mm Hg =30 PLAX S LVID ES, 34.7 mm 23-38 Left ventricle chord, Ea, lat 5.04 cm/s ------ PLAX ann, tiss FS, chord, 31 % >29 DP PLAX E/Ea, lat 24.6 ------ LVPW, ED 9.69 mm ------ ann, tiss IVS/LVPW 1.3 <1.3 DP ratio, ED Ea, med 2.52 cm/s ------ Ventricular septum ann, tiss IVS, ED 12.6 mm ------ DP LVOT E/Ea, med 49.2 ------ Diam, S 24 mm ------ ann, tiss 1  Area 4.52 cm^2 ------ DP Diam 24 mm ------ LVOT Aorta Peak vel, 127 cm/s ------ Root diam, 32 mm ------ S ED VTI, S 21.3 cm ------ AAo AP 40 mm ------ Peak 6 mm Hg ------ diam, S gradient, Left atrium S AP dim 53 mm ------ Stroke vol 96.4 ml ------ AP dim 2.75 cm/m^2 <2.2 Stroke 49.9 ml/m^2 ------ index index Mitral valve Peak E vel 124 cm/s ------ Peak A vel 62.7 cm/s ------ Mean vel, 74.7 cm/s ------ D Decelerati 335 ms 150-23 on time 0 Pressure 93 ms ------ half-time Mean 3 mm Hg ------ gradient, D Peak 9 mm Hg ------ gradient, D Peak E/A 2 ------ ratio Area (PHT) 2.37 cm^2 ------ Area index 1.23 cm^2/m ------ (PHT) ^2 Area 2.06 cm^2 ------ (LVOT) continuity Area index 1.07 cm^2/m ------ (LVOT ^2 cont) Annulus 46.8 cm ------ VTI Tricuspid valve Regurg 259 cm/s ------ peak vel Peak RV-RA 27 mm Hg ------ gradient, S Systemic veins Estimated 5 mm Hg ------ CVP Right ventricle Pressure, 32 mm Hg <30 S Sa vel, 6.36 cm/s ------ lat ann, tiss DP  ------------------------------------------------------------ Prepared and  Electronically Authenticated by  Lorine Bears 2014-09-24T15:[id number]    Impression:  The patient is doing very well 3 months following mitral valve repair, coronary artery bypass grafting, and Maze procedure. He is maintaining sinus rhythm and clinically doing exceptionally well. His blood pressure is somewhat high in the office today but the patient reports that it was normal this morning when he checked at and he commonly gets increased blood pressure when he goes to the doctor.    Plan:  We have not made any changes in the patient's current medications. I suggested that he continue to keep a close eye on his blood pressure. Overall he seems to be doing exceptionally well. At this point he may continue to increase his physical activity without any particular limitations. All of his questions been addressed. We will have him return in 3 months for routine followup and rhythm check   Clarence H. Cornelius Moras, MD 11/02/2013 12:55 PM

## 2014-01-04 ENCOUNTER — Ambulatory Visit: Payer: Self-pay | Admitting: Cardiovascular Disease

## 2014-01-05 ENCOUNTER — Telehealth: Payer: Self-pay | Admitting: Cardiovascular Disease

## 2014-01-05 NOTE — Telephone Encounter (Signed)
REVIEWED PT'S MEDS WITH WIFE   SHOULD BE OKAY ON  REFILLS  UNTIL  MARCH APPT./CY

## 2014-01-05 NOTE — Telephone Encounter (Signed)
New Prob   Pts wife called to reschedule pts appt due to weather. She has some questions regarding his medication refills given the next available appt being in March. Please call.

## 2014-01-06 ENCOUNTER — Ambulatory Visit: Payer: BC Managed Care – PPO | Admitting: Cardiovascular Disease

## 2014-02-01 ENCOUNTER — Encounter: Payer: Self-pay | Admitting: Thoracic Surgery (Cardiothoracic Vascular Surgery)

## 2014-02-01 ENCOUNTER — Ambulatory Visit (INDEPENDENT_AMBULATORY_CARE_PROVIDER_SITE_OTHER): Payer: BC Managed Care – PPO | Admitting: Thoracic Surgery (Cardiothoracic Vascular Surgery)

## 2014-02-01 VITALS — BP 176/97 | HR 64 | Resp 16 | Ht 70.0 in | Wt 177.0 lb

## 2014-02-01 DIAGNOSIS — Z8679 Personal history of other diseases of the circulatory system: Secondary | ICD-10-CM

## 2014-02-01 DIAGNOSIS — Z951 Presence of aortocoronary bypass graft: Secondary | ICD-10-CM

## 2014-02-01 DIAGNOSIS — I2510 Atherosclerotic heart disease of native coronary artery without angina pectoris: Secondary | ICD-10-CM

## 2014-02-01 DIAGNOSIS — I059 Rheumatic mitral valve disease, unspecified: Secondary | ICD-10-CM

## 2014-02-01 DIAGNOSIS — Z8774 Personal history of (corrected) congenital malformations of heart and circulatory system: Secondary | ICD-10-CM

## 2014-02-01 DIAGNOSIS — I4891 Unspecified atrial fibrillation: Secondary | ICD-10-CM

## 2014-02-01 DIAGNOSIS — I340 Nonrheumatic mitral (valve) insufficiency: Secondary | ICD-10-CM

## 2014-02-01 DIAGNOSIS — Z9889 Other specified postprocedural states: Secondary | ICD-10-CM

## 2014-02-01 NOTE — Patient Instructions (Signed)
  Endocarditis is a potentially serious infection of heart valves or inside lining of the heart.  It occurs more commonly in patients with diseased heart valves (such as patient's with aortic or mitral valve disease) and in patients who have undergone heart valve repair or replacement.  Certain surgical and dental procedures may put you at risk, such as dental cleaning, other dental procedures, or any surgery involving the respiratory, urinary, gastrointestinal tract, gallbladder or prostate gland.   To minimize your chances for develooping endocarditis, maintain good oral health and seek prompt medical attention for any infections involving the mouth, teeth, gums, skin or urinary tract.  Always notify your doctor or dentist about your underlying heart valve condition before having any invasive procedures. You will need to take antibiotics before certain procedures.   Patient may resume normal physical activity without any particular limitations at this time, and return to our office as needed should any further problems or questions arise.    

## 2014-02-01 NOTE — Progress Notes (Signed)
[address]       [place], [address]             [phone]     CARDIOTHORACIC SURGERY OFFICE NOTE  Referring Provider is Fay Records, MD PCP is Glenda Chroman., MD   HPI:  Mr. Delariva is a 68 yo male who returns for routine followup status post mitral valve repair, coronary artery bypass grafting x4, Maze procedure, and closure of patent foramen ovale on 07/22/2013. His post-operative recovery has been uncomplicated. He was last seen by Dr. Burt Knack on 10/02/2013. He continues to maintain sinus rhythm. He reports having no symptoms and that he has been walking everyday, most recently he walked/ran 3 miles without complaint. He states that he "feels better than he has in 20 years". He has noticed a sizable increase in his exercise tolerance. He is making a conscious effort to eat a heart healthy diet. He denies any SOB, chest pain, dizziness, or lightheadedness.     Current Outpatient Prescriptions  Medication Sig Dispense Refill  . aspirin EC 81 MG EC tablet Take 1 tablet (81 mg total) by mouth daily.      Marland Kitchen atorvastatin (LIPITOR) 40 MG tablet Take 1 tablet (40 mg total) by mouth daily at 6 PM.  30 tablet  11  . benazepril (LOTENSIN) 10 MG tablet Take 1 tablet (10 mg total) by mouth daily.  30 tablet  6  . metoprolol tartrate (LOPRESSOR) 25 MG tablet Take one tablet twice daily       No current facility-administered medications for this visit.      Physical Exam:   BP 176/97  Pulse 64  Resp 16  Ht 5\' 10"  (1.778 m)  Wt 177 lb (80.287 kg)  BMI 25.40 kg/m2  SpO2 97%  General:  Well developed, well nourished, well appearing  Chest:   Clear to auscultation  CV:   RRR, no murmurs, rubs, or gallops  Incisions:  Healing nicely  Abdomen:  Soft and nontender  Extremities:  Warm and well perfused  Diagnostic Tests:  2 channel telemetry rhythm strip demonstrates normal sinus rhythm    Transthoracic Echocardiography  Patient:    Bailee, Mackin MR #:        [id number] Study Date: 09/16/2013 Gender:     M Age:        61 Height:     177.8cm Weight:     75.8kg BSA:        1.58m^2 Pt. Status: Room:    ATTENDING    Catalina Antigua, Gala Lewandowsky  PERFORMING   [facility]  SONOGRAPHER  Victorio Palm, RDCS cc:  ------------------------------------------------------------ LV EF: 50% -   55%  ------------------------------------------------------------ Indications:      424.0 Mitral valve disease.  427.31 Atrial Fibrillation.  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  ------------------------------------------------------------ History:   PMH:  History of mitral valve prolapse with severe mitral regurgitation. Acquired from the patient and from the patient's chart.  Atrial fibrillation.  Coronary artery disease.  Congestive heart failure.  Mitral valve disease, post annuloplasty.  ------------------------------------------------------------ Study Conclusions  - Left ventricle: The cavity size was normal. There was mild   concentric hypertrophy. Systolic function was normal. The   estimated ejection fraction was in the range of 50% to   55%. Wall motion was normal; there were no regional wall   motion abnormalities. Features are  consistent with a   pseudonormal left ventricular filling pattern, with   concomitant abnormal relaxation and increased filling   pressure (grade 2 diastolic dysfunction). - Ventricular septum: Septal motion showed dyssynergy. These   changes are consistent with a post-thoracotomy state. - Aorta: Ascending aortic diameter: 14mm (S). - Mitral valve: Prior procedures included surgical repair.   Trivial regurgitation. Mean gradient: 26mm Hg (D). Valve   area by pressure half-time: 2.37cm^2. - Left atrium: The atrium was mildly dilated. - Tricuspid valve: Trivial regurgitation. - Pulmonary arteries: PA peak pressure: 17mm Hg  (S).  ------------------------------------------------------------ Labs, prior tests, procedures, and surgery: Transesophageal echocardiography (July 2014).    The mitral valve showed moderate to severe regurgitation.  EF was 50%. Flail P2 of the mitral valve.  Coronary artery bypass grafting (July 2014).    Mitral annuloplasty with a 5mm Sorin ring. Maze procedure, PFO closure, and left atrial appendage clipping. Transthoracic echocardiography.  M-mode, complete 2D, spectral Doppler, and color Doppler.  Height:  Height: 177.8cm. Height: 70in.  Weight:  Weight: 75.8kg. Weight: 166.7lb.  Body mass index:  BMI: 24kg/m^2.  Body surface area:    BSA: 1.40m^2.  Blood pressure:     128/88.  Patient status:  Outpatient.  Location:  [facility]  ------------------------------------------------------------  ------------------------------------------------------------ Left ventricle:  The cavity size was normal. There was mild concentric hypertrophy. Systolic function was normal. The estimated ejection fraction was in the range of 50% to 55%. Wall motion was normal; there were no regional wall motion abnormalities. Features are consistent with a pseudonormal left ventricular filling pattern, with concomitant abnormal relaxation and increased filling pressure (grade 2 diastolic dysfunction).  ------------------------------------------------------------ Aortic valve:   Trileaflet; normal thickness, mildly calcified leaflets. Mobility was not restricted.  Doppler: Transvalvular velocity was within the normal range. There was no stenosis.  No regurgitation.  ------------------------------------------------------------ Aorta:  Aortic root: The aortic root was normal in size.  ------------------------------------------------------------ Mitral valve:  Well visualized. Prior procedures included surgical repair. Mobility was not restricted.  Doppler: Transvalvular velocity was within  the normal range. There was no evidence for stenosis.  Trivial regurgitation. Valve area by pressure half-time: 2.37cm^2. Indexed valve area by pressure half-time: 1.23cm^2/m^2. Valve area by continuity equation (using LVOT flow): 2.06cm^2. Indexed valve area by continuity equation (using LVOT flow): 1.07cm^2/m^2.    Mean gradient: 44mm Hg (D). Peak gradient: 42mm Hg (D).  ------------------------------------------------------------ Left atrium:  The atrium was mildly dilated.  ------------------------------------------------------------ Right ventricle:  The cavity size was normal. Wall thickness was normal. Systolic function was normal.  ------------------------------------------------------------ Ventricular septum:   Septal motion showed dyssynergy. These changes are consistent with a post-thoracotomy state.  ------------------------------------------------------------ Pulmonic valve:    Structurally normal valve.   Cusp separation was normal.  Doppler:  Transvalvular velocity was within the normal range. There was no evidence for stenosis.  Trivial regurgitation.  ------------------------------------------------------------ Tricuspid valve:   Structurally normal valve.   Leaflet separation was normal.  Doppler:  Transvalvular velocity was within the normal range.  Trivial regurgitation.  ------------------------------------------------------------ Pulmonary artery:   The main pulmonary artery was normal-sized. Systolic pressure was within the normal range.   ------------------------------------------------------------ Right atrium:  The atrium was normal in size.  ------------------------------------------------------------ Pericardium:  The pericardium was normal in appearance. There was no pericardial effusion.  ------------------------------------------------------------ Systemic veins: Inferior vena cava: Not  visualized.  ------------------------------------------------------------  2D measurements        Normal  Doppler measurements   Normal Left ventricle  Main pulmonary LVID ED,   50.5 mm     43-52   artery chord,                         Pressure,    32 mm Hg  =30 PLAX                           S LVID ES,   34.7 mm     23-38   Left ventricle chord,                         Ea, lat    5.04 cm/s   ------ PLAX                           ann, tiss FS, chord,   31 %      >29     DP PLAX                           E/Ea, lat  24.6        ------ LVPW, ED   9.69 mm     ------  ann, tiss IVS/LVPW    1.3        <1.3    DP ratio, ED                      Ea, med    2.52 cm/s   ------ Ventricular septum             ann, tiss IVS, ED    12.6 mm     ------  DP LVOT                           E/Ea, med  49.2        ------ Diam, S      24 mm     ------  ann, tiss     1 Area       4.52 cm^2   ------  DP Diam         24 mm     ------  LVOT Aorta                          Peak vel,   127 cm/s   ------ Root diam,   32 mm     ------  S ED                             VTI, S     21.3 cm     ------ AAo AP       40 mm     ------  Peak          6 mm Hg  ------ diam, S                        gradient, Left atrium                    S AP dim       53 mm     ------  Stroke vol 96.4 ml     ------ AP dim  2.75 cm/m^2 <2.2    Stroke     49.9 ml/m^2 ------ index                          index                                Mitral valve                                Peak E vel  124 cm/s   ------                                Peak A vel 62.7 cm/s   ------                                Mean vel,  74.7 cm/s   ------                                D                                Decelerati  335 ms     150-23                                on time                0                                Pressure     93 ms     ------                                half-time                                Mean           3 mm Hg  ------                                gradient,                                D                                Peak          9 mm Hg  ------                                gradient,  D                                Peak E/A      2        ------                                ratio                                Area (PHT) 2.37 cm^2   ------                                Area index 1.23 cm^2/m ------                                (PHT)           ^2                                Area       2.06 cm^2   ------                                (LVOT)                                continuity                                Area index 1.07 cm^2/m ------                                (LVOT           ^2                                cont)                                Annulus    46.8 cm     ------                                VTI                                Tricuspid valve                                Regurg      259 cm/s   ------                                peak vel  Peak RV-RA   27 mm Hg  ------                                gradient,                                S                                Systemic veins                                Estimated     5 mm Hg  ------                                CVP                                Right ventricle                                Pressure,    32 mm Hg  <30                                S                                Sa vel,    6.36 cm/s   ------                                lat ann,                                tiss DP   ------------------------------------------------------------ Prepared and Electronically Authenticated by  Kathlyn Sacramento 2014-09-24T15:[id number]   Impression:  Mr. Bente is doing remarkably well recovering post mitral valve repair, coronary artery bypass grafting x4, Maze procedure, and closure of patent  foramen ovale on 07/22/2013. He is able to do all the things that he wants to do without any symptoms. He is keeping to a regular exercise routine and trying to maintain a heart healthy diet. He expresses concerns about a low heart rate, even when exercising but he was encouraged that this is normal especially patients who are taking a beta blocker.  Late followup echocardiogram looks good with no significant residual mitral regurgitation and preserved left ventricular systolic function.  Plan:  BP slightly elevated this visit, but have been lower at home per patient.  Continue current exercise and nutrition plan.  F/u in 6 months for a 1 year rhythm check.   Nicholes Rough, PA-S     I have seen and examined the patient and agree with the assessment and plan as outlined.    Valentina Gu. Roxy Manns, MD 02/01/2014 3:04 PM

## 2014-02-24 ENCOUNTER — Ambulatory Visit: Payer: BC Managed Care – PPO | Admitting: Cardiovascular Disease

## 2014-02-24 ENCOUNTER — Other Ambulatory Visit: Payer: Self-pay

## 2014-02-25 ENCOUNTER — Encounter: Payer: Self-pay | Admitting: Cardiovascular Disease

## 2014-02-25 ENCOUNTER — Other Ambulatory Visit: Payer: BC Managed Care – PPO

## 2014-02-25 ENCOUNTER — Ambulatory Visit (INDEPENDENT_AMBULATORY_CARE_PROVIDER_SITE_OTHER): Payer: BC Managed Care – PPO | Admitting: Cardiovascular Disease

## 2014-02-25 VITALS — BP 168/104 | HR 63 | Ht 70.0 in | Wt 183.0 lb

## 2014-02-25 DIAGNOSIS — I509 Heart failure, unspecified: Secondary | ICD-10-CM

## 2014-02-25 DIAGNOSIS — I10 Essential (primary) hypertension: Secondary | ICD-10-CM

## 2014-02-25 DIAGNOSIS — I2510 Atherosclerotic heart disease of native coronary artery without angina pectoris: Secondary | ICD-10-CM

## 2014-02-25 DIAGNOSIS — Z9889 Other specified postprocedural states: Secondary | ICD-10-CM

## 2014-02-25 DIAGNOSIS — I5041 Acute combined systolic (congestive) and diastolic (congestive) heart failure: Secondary | ICD-10-CM

## 2014-02-25 LAB — HEPATIC FUNCTION PANEL
ALBUMIN: 4 g/dL (ref 3.5–5.2)
ALT: 24 U/L (ref 0–53)
AST: 23 U/L (ref 0–37)
Alkaline Phosphatase: 71 U/L (ref 39–117)
Bilirubin, Direct: 0.1 mg/dL (ref 0.0–0.3)
TOTAL PROTEIN: 7.3 g/dL (ref 6.0–8.3)
Total Bilirubin: 0.8 mg/dL (ref 0.3–1.2)

## 2014-02-25 LAB — LIPID PANEL
CHOLESTEROL: 124 mg/dL (ref 0–200)
HDL: 36.2 mg/dL — AB (ref >39.00)
LDL CALC: 42 mg/dL (ref 0–99)
TRIGLYCERIDES: 228 mg/dL — AB (ref 0.0–149.0)
Total CHOL/HDL Ratio: 3
VLDL: 45.6 mg/dL — AB (ref 0.0–40.0)

## 2014-02-25 NOTE — Progress Notes (Signed)
HPI:   68 year-old male presenting for cardiology follow-up. He's followed for CAD, mitral regurgitation, and atrial fibrillation. The patient underwent 4 vessel CABG, mitral valve repair, and cryo- Cox-Maze procedure in July 2014. He's had an excellent recovery and feels very well. He is walking on a treadmill 2 miles daily without exertional symptoms. He specifically denies chest pain, chest pressure, shortness of breath, edema, or palpitations. He's had no episodes of lightheadedness or presyncope. He has a long history of white coat hypertension. He checks his blood pressure at home on a daily basis and reports readings in the 120s over 70s. Blood pressure is in the same range at the pharmacy yesterday. He noted that at one time his heart rate was in the low 40s on his home monitor. At the time he was asymptomatic.  Outpatient Encounter Prescriptions as of 02/25/2014  Medication Sig  . aspirin EC 81 MG EC tablet Take 1 tablet (81 mg total) by mouth daily.  Marland Kitchen atorvastatin (LIPITOR) 40 MG tablet Take 1 tablet (40 mg total) by mouth daily at 6 PM.  . benazepril (LOTENSIN) 10 MG tablet Take 1 tablet (10 mg total) by mouth daily.  . metoprolol tartrate (LOPRESSOR) 25 MG tablet Take one tablet twice daily    Allergies  Allergen Reactions  . Meperidine And Related Itching  . Shellfish Allergy     flulike symptoms   . Sulfa Antibiotics     "very sick"    Past Medical History  Diagnosis Date  . Mitral valve prolapse 07/06/2013  . Mitral regurgitation 07/06/2013  . Atrial fibrillation 07/06/2013  . Essential hypertension, benign 07/06/2013  . 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 Heart murmur   . Sleep apnea   . Anxiety   . Shortness of breath   . Kidney stones     some passed spontaneous, one retrieved by cystoscope   . CKD (chronic kidney disease), stage III   . 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 S/P Maze operation for atrial fibrillation 07/22/2013    Complete bilateral atrial lesion set using bipolar radiofrequency and cryothermy ablation with clipping of LA appendage    ROS: Negative except as per HPI  BP 168/104  Pulse 63  Ht 5\' 10"  (1.778 m)  Wt 183 lb (83.008 kg)  BMI 26.26 kg/m2  PHYSICAL EXAM: Pt is alert and oriented, NAD HEENT: normal Neck: JVP - normal, carotids 2+= without bruits Lungs: CTA bilaterally CV: RRR without murmur or gallop Abd: soft, NT, Positive BS, no hepatomegaly Ext: no C/C/E, distal pulses intact and equal Skin: warm/dry no rash  EKG:  Sinus rhythm with ventricular bigeminy. Heart rate 63 beats per minute.  ASSESSMENT AND PLAN: 1. Mitral valve disease status post surgical repair. His exam is within normal limits. His postoperative echo showed an intact repair with no residual mitral regurgitation. He is New York Heart Association functional class I. He will return in 6 months for followup evaluation.  2. Coronary artery disease, native vessel. Patient is stable without symptoms of angina. He's on aspirin, a statin drug, and a beta blocker.  3. Ventricular bigeminy. The patient is asymptomatic. He had bigeminy on  his EKG but on my exam he was back in a regular sinus rhythm. I explained to him that if he has frequent PVCs he may see low heart rate readings on his home monitor.  4. Hypertension. He clearly has significant white coat hypertension, but home readings are within range. He will continue on metoprolol and benazepril.  5. Hyperlipidemia. The patient is on atorvastatin 40 mg. Recommend lipids and LFTs when he returns in 6 months.  Sherren Mocha 02/25/2014 1:14 PM

## 2014-02-25 NOTE — Patient Instructions (Signed)
Your physician recommends that you continue on your current medications as directed. Please refer to the Current Medication list given to you today.  Your physician recommends that you return for lab work in: 6 months prior to your office visit with Dr. Burt Knack - you need to fast for this appointment (nothing to eat or drink after midnight the night before your appointment)  Your physician wants you to follow-up in: 6 months with Dr. Emelda Fear will receive a reminder letter in the mail two months in advance. If you don't receive a letter, please call our office to schedule the follow-up appointment.

## 2014-04-15 ENCOUNTER — Other Ambulatory Visit: Payer: Self-pay | Admitting: *Deleted

## 2014-04-15 MED ORDER — BENAZEPRIL HCL 10 MG PO TABS: 10.0000 mg | ORAL_TABLET | Freq: Every day | ORAL | Status: DC

## 2014-05-04 ENCOUNTER — Encounter: Payer: Self-pay | Admitting: Cardiovascular Disease

## 2014-05-04 NOTE — Telephone Encounter (Signed)
New message        Pt would like to know if he needs to do fasting labs before his next office visit. If so he would like to do it in [place].

## 2014-05-04 NOTE — Telephone Encounter (Signed)
This encounter was created in error - please disregard.

## 2014-07-07 ENCOUNTER — Other Ambulatory Visit (HOSPITAL_COMMUNITY): Payer: Self-pay | Admitting: Physician Assistant

## 2014-08-06 ENCOUNTER — Other Ambulatory Visit: Payer: Self-pay | Admitting: Cardiovascular Disease

## 2014-08-09 ENCOUNTER — Encounter: Payer: Self-pay | Admitting: Thoracic Surgery (Cardiothoracic Vascular Surgery)

## 2014-08-09 ENCOUNTER — Ambulatory Visit (INDEPENDENT_AMBULATORY_CARE_PROVIDER_SITE_OTHER): Payer: BC Managed Care – PPO | Admitting: Thoracic Surgery (Cardiothoracic Vascular Surgery)

## 2014-08-09 ENCOUNTER — Telehealth: Payer: Self-pay | Admitting: *Deleted

## 2014-08-09 VITALS — BP 175/93 | HR 60 | Ht 70.0 in | Wt 183.0 lb

## 2014-08-09 DIAGNOSIS — Z9889 Other specified postprocedural states: Secondary | ICD-10-CM

## 2014-08-09 DIAGNOSIS — Z8679 Personal history of other diseases of the circulatory system: Secondary | ICD-10-CM

## 2014-08-09 DIAGNOSIS — I2510 Atherosclerotic heart disease of native coronary artery without angina pectoris: Secondary | ICD-10-CM

## 2014-08-09 DIAGNOSIS — Z951 Presence of aortocoronary bypass graft: Secondary | ICD-10-CM

## 2014-08-09 NOTE — Progress Notes (Signed)
      [address]       [place] [address]             [place] OFFICE NOTE  Referring Provider is Fay Records, MD Primary Cardiologist is Sherren Mocha, MD PCP is Glenda Chroman., MD   HPI:  Henry Page is a 68 yo male who returns for routine followup status post mitral valve repair, coronary artery bypass grafting x4, Maze procedure, and closure of patent foramen ovale on 07/22/2013.   Late followup echocardiogram performed In September of 2014 look good with normal left ventricular function and intact mitral valve repair.  He was last seen here in our office on 02/01/2014 at which time he was doing very well. He has maintained sinus rhythm and returned to completely normal exercise tolerance. He returns to the office today reporting that he continues to do exceptionally well. He states that he has no physical limitations whatsoever. He exercises regularly and typically walks at least one or 2 miles every day. He denies any exertional shortness of breath or chest discomfort. He has not had tachypalpitations or dizzy spells.   Current Outpatient Prescriptions  Medication Sig Dispense Refill  . aspirin EC 81 MG EC tablet Take 1 tablet (81 mg total) by mouth daily.      Marland Kitchen atorvastatin (LIPITOR) 40 MG tablet TAKE 1 TABLET BY MOUTH EVERY DAY AT 6:00PM  30 tablet  1  . benazepril (LOTENSIN) 10 MG tablet Take 1 tablet (10 mg total) by mouth daily.  30 tablet  6  . metoprolol tartrate (LOPRESSOR) 25 MG tablet TAKE 1 TABLET BY MOUTH TWICE DAILY  60 tablet  2   No current facility-administered medications for this visit.      Physical Exam:   BP 175/93  Pulse 60  Ht 5\' 10"  (1.778 m)  Wt 183 lb (83.008 kg)  BMI 26.26 kg/m2  SpO2 98%  General:  Well-appearing  Chest:   Clear  CV:   Regular rate and rhythm without murmur  Incisions:  Completely healed, sternum is stable  Abdomen:  Soft and nontender  Extremities:  Warm and  well-perfused  Diagnostic Tests:  2 channel telemetry rhythm strip demonstrates normal sinus rhythm   Impression:  Patient is doing very well more than one year status post mitral valve repair, and Maze procedure, and coronary artery bypass grafting. He is maintaining sinus rhythm. He has normal exercise tolerance, New York Heart Association functional class I.  His blood pressure is running somewhat high again here in the office, but he states that he suffers from "white coat syndrome". He checks his blood pressure regular home and that typically runs somewhat lower than what was measured today.  Plan:  The patient has been reminded regarding the need for long-term antibiotic prophylaxis for all dental cleaning and related procedures.   He will continue to followup with Dr. Burt Knack and report his blood pressure measurements to consider dosage adjustment of his antihypertensive medications as needed.  He will additionally be referred to the [facility] clinic for long-term rhythm followup.   I spent in excess of 15 minutes during the conduct of this office consultation and >50% of this time involved direct face-to-face encounter with the patient for counseling and/or coordination of their care.   Valentina Gu. Roxy Manns, MD 08/09/2014 10:50 AM

## 2014-08-09 NOTE — Telephone Encounter (Signed)
Patients wife called and stated that the patient has been taking the hctz up until he recently ran out. She is requesting a refill, but it looks like he was taken off of this at some point. She stated that they were unaware of that he was to stop taking it. Please advise. Thanks, MI

## 2014-08-09 NOTE — Telephone Encounter (Signed)
Left message on machine for pt to contact the office.   

## 2014-08-09 NOTE — Patient Instructions (Signed)

## 2014-08-10 ENCOUNTER — Other Ambulatory Visit: Payer: Self-pay

## 2014-08-10 MED ORDER — HYDROCHLOROTHIAZIDE 25 MG PO TABS: 25.0000 mg | ORAL_TABLET | Freq: Every day | ORAL | Status: DC

## 2014-08-10 NOTE — Telephone Encounter (Signed)
Rose spoke with the pt's wife and the pt has been taking HCTZ 25mg  daily.  I instructed her to refill this medication.

## 2014-08-27 ENCOUNTER — Encounter: Payer: Self-pay | Admitting: Cardiovascular Disease

## 2014-08-27 ENCOUNTER — Ambulatory Visit (INDEPENDENT_AMBULATORY_CARE_PROVIDER_SITE_OTHER): Payer: BC Managed Care – PPO | Admitting: Cardiovascular Disease

## 2014-08-27 VITALS — BP 172/90 | HR 63 | Ht 70.0 in | Wt 180.8 lb

## 2014-08-27 DIAGNOSIS — Z9889 Other specified postprocedural states: Secondary | ICD-10-CM

## 2014-08-27 DIAGNOSIS — I2510 Atherosclerotic heart disease of native coronary artery without angina pectoris: Secondary | ICD-10-CM

## 2014-08-27 DIAGNOSIS — I10 Essential (primary) hypertension: Secondary | ICD-10-CM

## 2014-08-27 DIAGNOSIS — I5041 Acute combined systolic (congestive) and diastolic (congestive) heart failure: Secondary | ICD-10-CM

## 2014-08-27 DIAGNOSIS — I509 Heart failure, unspecified: Secondary | ICD-10-CM

## 2014-08-27 LAB — MAGNESIUM: Magnesium: 1.9 mg/dL (ref 1.5–2.5)

## 2014-08-27 LAB — HEPATIC FUNCTION PANEL
ALT: 18 U/L (ref 0–53)
AST: 26 U/L (ref 0–37)
Albumin: 3.9 g/dL (ref 3.5–5.2)
Alkaline Phosphatase: 75 U/L (ref 39–117)
BILIRUBIN DIRECT: 0.1 mg/dL (ref 0.0–0.3)
BILIRUBIN TOTAL: 0.8 mg/dL (ref 0.2–1.2)
Total Protein: 7.5 g/dL (ref 6.0–8.3)

## 2014-08-27 LAB — LIPID PANEL
Cholesterol: 120 mg/dL (ref 0–200)
HDL: 30.3 mg/dL — ABNORMAL LOW (ref >39.00)
NONHDL: 89.7
Total CHOL/HDL Ratio: 4
Triglycerides: 245 mg/dL — ABNORMAL HIGH (ref 0.0–149.0)
VLDL: 49 mg/dL — AB (ref 0.0–40.0)

## 2014-08-27 LAB — BASIC METABOLIC PANEL
BUN: 27 mg/dL — AB (ref 6–23)
CO2: 27 mEq/L (ref 19–32)
CREATININE: 1.5 mg/dL (ref 0.4–1.5)
Calcium: 9.2 mg/dL (ref 8.4–10.5)
Chloride: 101 mEq/L (ref 96–112)
GFR: 49.87 mL/min — AB (ref >60.00)
GLUCOSE: 93 mg/dL (ref 70–99)
Potassium: 4.1 mEq/L (ref 3.5–5.1)
Sodium: 136 mEq/L (ref 135–145)

## 2014-08-27 LAB — LDL CHOLESTEROL, DIRECT: Direct LDL: 52.5 mg/dL

## 2014-08-27 NOTE — Patient Instructions (Signed)
Your physician recommends that you have lab work today: BMP, Magnesium, LIPID and LIVER  Your physician wants you to follow-up in: 6 MONTHS with Dr Burt Knack.  You will receive a reminder letter in the mail two months in advance. If you don't receive a letter, please call our office to schedule the follow-up appointment.  Your physician recommends that you continue on your current medications as directed. Please refer to the Current Medication list given to you today.

## 2014-08-27 NOTE — Progress Notes (Signed)
HPI:  68 year-old male presenting for cardiology follow-up. He's followed for CAD, mitral regurgitation, and atrial fibrillation. The patient underwent 4 vessel CABG, mitral valve repair, and cryo- Cox-Maze procedure in July 2014. His postoperative echo showed normal mitral valve function after mitral valve repair. He has maintained sinus rhythm since undergoing a maze procedure. He continues to feel quite well. He denies any symptoms with chest pain, shortness of breath, leg swelling, or heart palpitations. He notes heart rates in the 50s frequently on his home blood pressure monitor. The patient has a long-standing history of white coat hypertension. He brings in multiple home blood pressure readings today and they range from 118-132/70-85.   Outpatient Encounter Prescriptions as of 08/27/2014  Medication Sig  . aspirin EC 81 MG EC tablet Take 1 tablet (81 mg total) by mouth daily.  Marland Kitchen atorvastatin (LIPITOR) 40 MG tablet TAKE 1 TABLET BY MOUTH EVERY DAY AT 6:00PM  . benazepril (LOTENSIN) 10 MG tablet Take 1 tablet (10 mg total) by mouth daily.  . hydrochlorothiazide (HYDRODIURIL) 25 MG tablet Take 1 tablet (25 mg total) by mouth daily.  . metoprolol tartrate (LOPRESSOR) 25 MG tablet TAKE 1 TABLET BY MOUTH TWICE DAILY  . [DISCONTINUED] amoxicillin (AMOXIL) 500 MG capsule     Allergies  Allergen Reactions  . Meperidine And Related Itching  . Shellfish Allergy     flulike symptoms   . Sulfa Antibiotics     "very sick"    Past Medical History  Diagnosis Date  . Mitral valve prolapse 07/06/2013  . Mitral regurgitation 07/06/2013  . Atrial fibrillation 07/06/2013  . Essential hypertension, benign 07/06/2013  . 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 Heart murmur   . Sleep apnea   . Anxiety   . Shortness of breath   . Kidney stones     some passed spontaneous, one retrieved by cystoscope   . CKD (chronic kidney disease), stage III   . S/P mitral valve  repair 07/22/2013    Complex valvuloplasty including triangular resection of posterior leaflet with artificial Gore-tex neocord placement x2 and 108mm Sorin Memo 3D ring annuloplasty  . 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 S/P Maze operation for atrial fibrillation 07/22/2013    Complete bilateral atrial lesion set using bipolar radiofrequency and cryothermy ablation with clipping of LA appendage     ROS: Positive for nocturnal leg and feet cramps, otherwise negative except as per HPI  BP 172/90  Pulse 63  Ht 5\' 10"  (1.778 m)  Wt 180 lb 12.8 oz (82.01 kg)  BMI 25.94 kg/m2  PHYSICAL EXAM: Pt is alert and oriented, NAD HEENT: normal Neck: JVP - normal, carotids 2+= without bruits Lungs: CTA bilaterally CV: RRR without murmur or gallop Abd: soft, NT, Positive BS, no hepatomegaly Ext: no C/C/E, distal pulses intact and equal Skin: warm/dry no rash  EKG:  Normal sinus rhythm with occasional PVCs, otherwise within normal limits  2-D echocardiogram 09/16/2013: Study Conclusions  - Left ventricle: The cavity size was normal. There was mild concentric hypertrophy. Systolic function was normal. The estimated ejection fraction was in the range of 50% to 55%. Wall motion was normal; there were no regional wall motion abnormalities. Features are consistent with a pseudonormal left ventricular filling pattern, with concomitant abnormal relaxation and increased filling pressure (grade 2 diastolic dysfunction). - Ventricular septum: Septal motion  showed dyssynergy. These changes are consistent with a post-thoracotomy state. - Aorta: Ascending aortic diameter: 9mm (S). - Mitral valve: Prior procedures included surgical repair. Trivial regurgitation. Mean gradient: 52mm Hg (D). Valve area by pressure half-time: 2.37cm^2. - Left atrium: The atrium was mildly dilated. - Tricuspid valve: Trivial regurgitation. -  Pulmonary arteries: PA peak pressure: 72mm Hg (S).  ASSESSMENT AND PLAN: 1. Mitral valve disease status post surgical repair. His exam is within normal limits. His postoperative echo showed an intact repair with no residual mitral regurgitation. He is New York Heart Association functional class I. He will return in 6 months for followup evaluation.   2. Coronary artery disease, native vessel. Patient is stable without symptoms of angina. He's on aspirin, a statin drug, and a beta blocker.   3. Ventricular bigeminy. The patient is asymptomatic. By history he is having less bigeminy with fewer palpitations and he no longer sees heart rates in the 40s on his home BP monitor.  4. Hypertension, white coat. He brings in a log of BP readings, all in a well-controlled range.  He will continue on metoprolol and benazepril.   5. Hyperlipidemia. The patient is on atorvastatin 40 mg. Will draw labs today. Add Mg/K to evaluate leg cramps.  I will see him back in 6 months for follow-up evaluation.   Sherren Mocha MD 08/27/2014 10:24 AM

## 2014-09-06 ENCOUNTER — Other Ambulatory Visit (HOSPITAL_COMMUNITY): Payer: Self-pay | Admitting: Cardiovascular Disease

## 2014-10-05 ENCOUNTER — Other Ambulatory Visit (HOSPITAL_COMMUNITY): Payer: Self-pay | Admitting: Cardiovascular Disease

## 2014-10-08 ENCOUNTER — Other Ambulatory Visit: Payer: Self-pay

## 2014-11-02 ENCOUNTER — Other Ambulatory Visit: Payer: Self-pay | Admitting: Cardiovascular Disease

## 2014-12-02 ENCOUNTER — Encounter (HOSPITAL_COMMUNITY): Payer: Self-pay | Admitting: Cardiovascular Disease

## 2015-02-06 IMAGING — CR DG CHEST 1V PORT
1 series · 1 of 1 positions shown · non-contrast
Comparison: 07/20/2013

CLINICAL DATA: Status post CABG, mitral valve repair and Maze
procedure.

PORTABLE CHEST - 1 VIEW

[AP]
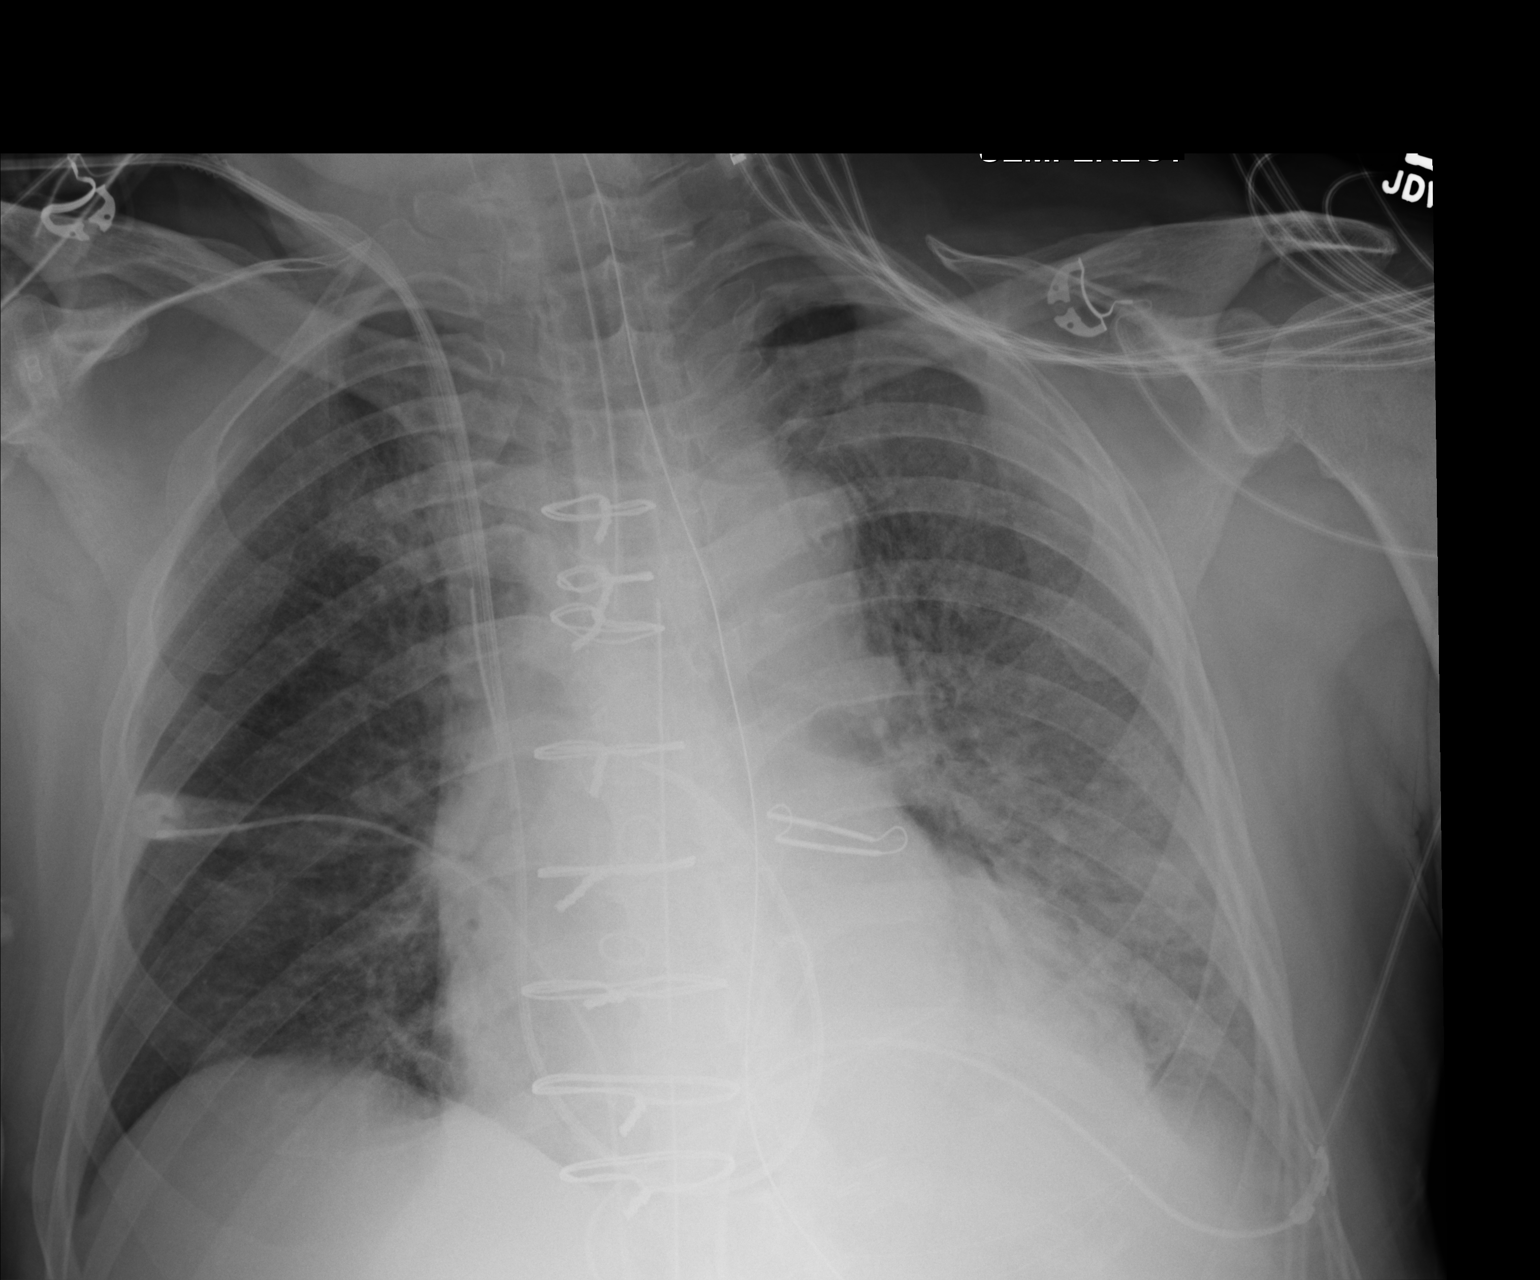

[1 of 1 positions shown; findings below may reference images not displayed]

FINDINGS: An endotracheal tube is present with the tip within 1 cm
of the carina.  A Swan-Ganz catheter is present with the tip in the
proximal right pulmonary artery.  Additional central line also
present with the tip in the lower SVC.  A nasogastric tube extends
below the diaphragm.  A right chest tube is present.  No
pneumothorax is identified. The heart size and mediastinal contours
are stable.  There is left lower lobe atelectasis.  No overt
pulmonary edema is seen.
IMPRESSION: Left lower lobe atelectasis postoperatively.  No pneumothorax.  The
endotracheal tube tip projects within 1 cm of the carina by x-ray.

## 2015-02-07 IMAGING — CR DG CHEST 1V PORT
1 series · 1 of 1 positions shown · non-contrast
Comparison: 07/22/2013, [DATE] hours.

CLINICAL DATA: Postoperative radiograph.Intubated patient.  Assess
support apparatus.

PORTABLE CHEST - 1 VIEW

[AP]
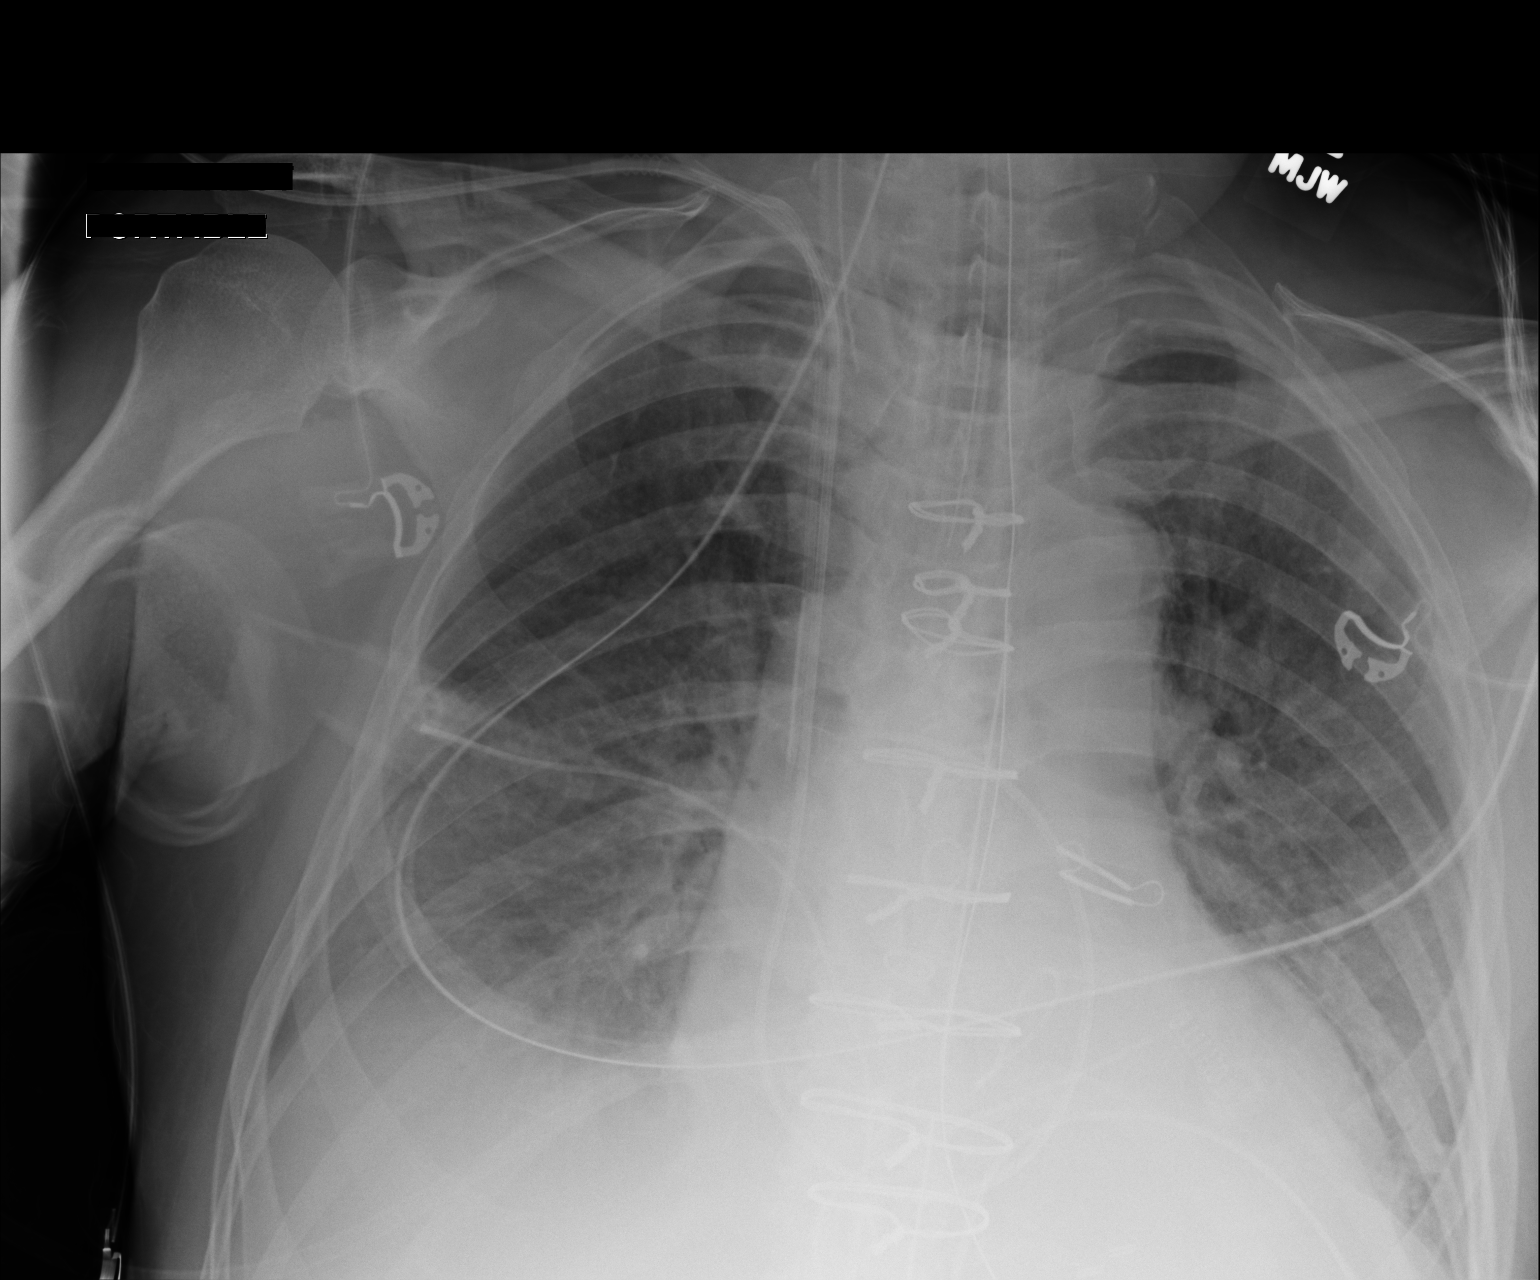

[1 of 1 positions shown; findings below may reference images not displayed]

FINDINGS: Endotracheal tube is 33 mm from the carina.  Right IJ
vascular sheath transmitting a Swan-Ganz catheter.  The tip is in
the right pulmonary artery.  There is also a right IJ catheter that
terminates in the mid to lower SVC.  CABG/median sternotomy.
Mitral annuloplasty.  Cardiopericardial silhouette is mildly
enlarged.  Pulmonary vascular congestion is present. Mild basilar
pulmonary edema.  Hazy opacity over the right base is compatible
with a posteriorly layering pleural effusion and associated
atelectasis.  Left basilar atelectasis is present as well.
Mediastinal drains appear similar. Left atrial clip noted.
IMPRESSION: 1.  Support apparatus in good position.
2.  Shifting pleural effusions, basilar atelectasis and mild
basilar pulmonary edema.

## 2015-02-08 IMAGING — CR DG CHEST 1V PORT
1 series · 1 of 1 positions shown · non-contrast
Comparison: 07/23/2013.

CLINICAL DATA: Mitral valve repair.

PORTABLE CHEST - 1 VIEW

[AP]
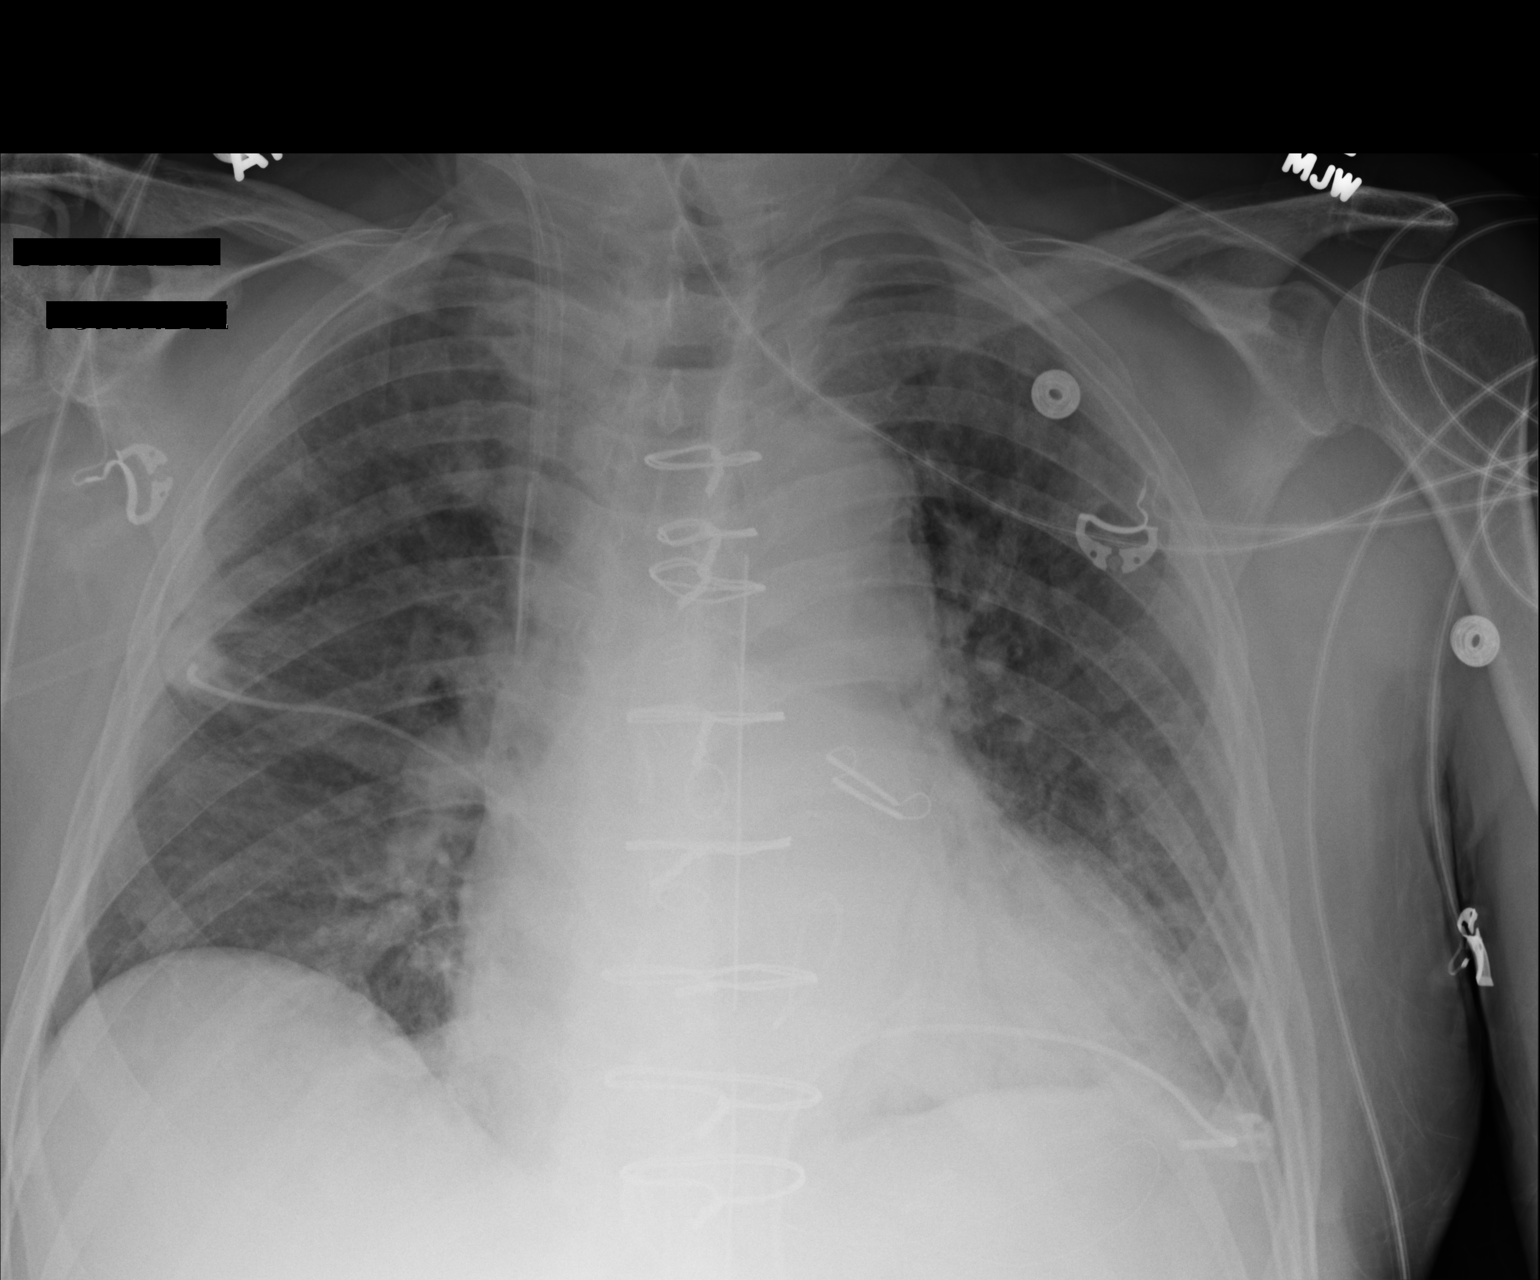

[1 of 1 positions shown; findings below may reference images not displayed]

FINDINGS: Swan-Ganz catheter removed.  Right IJ vascular sheath and
right IJ central line remain unchanged.  Bilateral thoracostomy
tubes and mediastinal drain remain present.  Mitral annuloplasty
and left atrial clip.  CABG/median sternotomy.  Basilar atelectasis
is present along with interstitial and mild alveolar pulmonary
edema. Monitoring leads are projected over the chest.  There is no
pneumothorax.  Cardiomegaly and mediastinal contours appears
similar.  The endotracheal tube and enteric tubes have been
removed.
IMPRESSION: 1.  Interval extubation and removal of enteric tube.
2.  Removal of Swan-Ganz catheter.
3.  Mild persistent pulmonary edema and basilar atelectasis.
4.  Postoperative changes mitral annuloplasty and CABG.  Unchanged
cardiomegaly.

## 2015-02-10 IMAGING — CR DG CHEST 1V PORT
1 series · 1 of 1 positions shown · non-contrast
Comparison: Prior radiograph from prior 07/25/2013

CLINICAL DATA: Status post mitral repair, maze

PORTABLE CHEST - 1 VIEW

[AP]
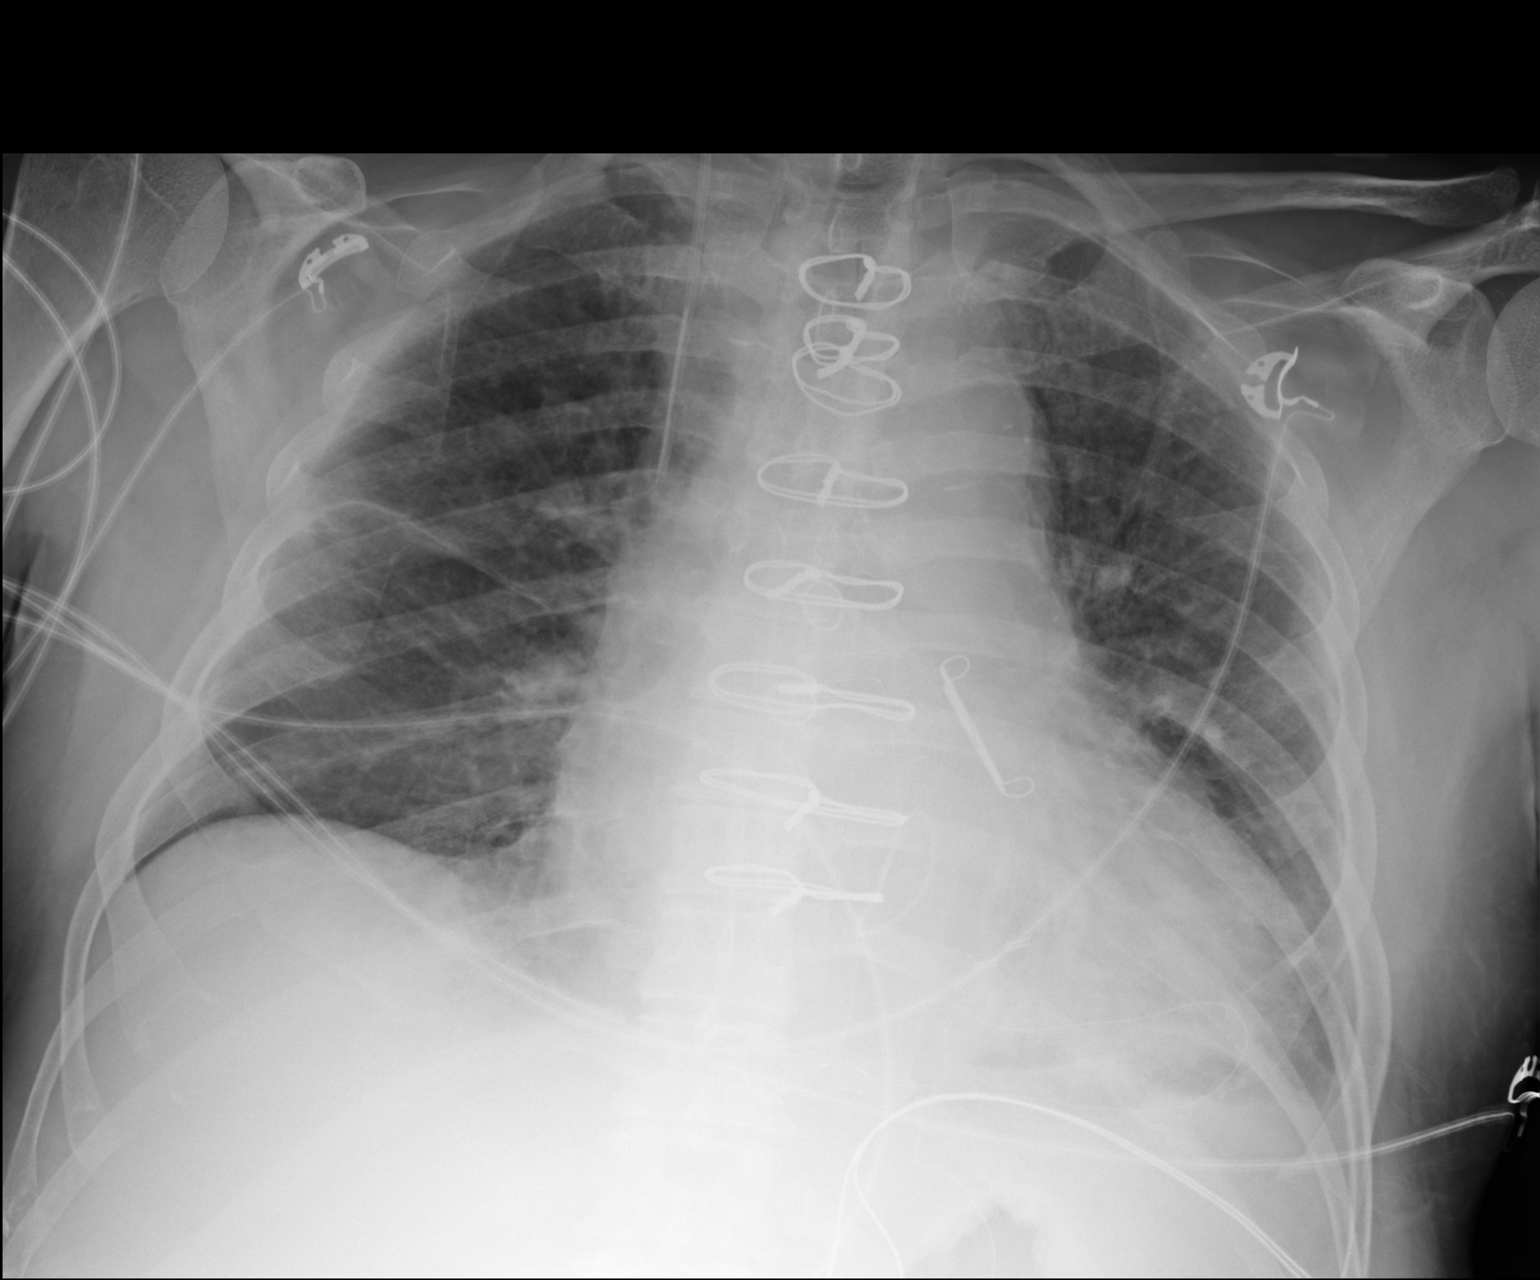

[1 of 1 positions shown; findings below may reference images not displayed]

FINDINGS: Cardiomegaly is stable.  Median sternotomy wires are
intact.  Mitral annuloplasty is again noted.  Left atrial appendage
clip is noted.  There has been interval removal of right IJ Cordis
catheter.  Additional right IJ central venous catheter is in stable
position with tip overlying the mid SVC.  Telemetry leads overlie
the chest.

Overall, the appearance of the lungs is not significantly changed
as compared to the most recent examination.  Mild bibasilar
atelectasis persist, left greater than right.  No overt pulmonary
edema.  There is a small left pleural effusion.

Osseous structures are unchanged.
IMPRESSION: Stable appearance of the chest with small left pleural effusion and
bibasilar atelectasis, left greater than right.  No overt pulmonary
edema

## 2015-02-25 ENCOUNTER — Ambulatory Visit: Payer: Self-pay | Admitting: Cardiovascular Disease

## 2015-03-22 ENCOUNTER — Encounter: Payer: Self-pay | Admitting: Cardiovascular Disease

## 2015-03-22 ENCOUNTER — Ambulatory Visit (INDEPENDENT_AMBULATORY_CARE_PROVIDER_SITE_OTHER): Payer: BLUE CROSS/BLUE SHIELD | Admitting: Cardiovascular Disease

## 2015-03-22 VITALS — BP 158/86 | HR 60 | Ht 70.0 in | Wt 177.0 lb

## 2015-03-22 DIAGNOSIS — I10 Essential (primary) hypertension: Secondary | ICD-10-CM

## 2015-03-22 DIAGNOSIS — I340 Nonrheumatic mitral (valve) insufficiency: Secondary | ICD-10-CM

## 2015-03-22 DIAGNOSIS — E785 Hyperlipidemia, unspecified: Secondary | ICD-10-CM | POA: Diagnosis not present

## 2015-03-22 DIAGNOSIS — I2510 Atherosclerotic heart disease of native coronary artery without angina pectoris: Secondary | ICD-10-CM

## 2015-03-22 NOTE — Patient Instructions (Signed)
Your physician recommends that you continue on your current medications as directed. Please refer to the Current Medication list given to you today.  Your physician recommends that you return for lab work: CMET and lipid panel. Please have nothing to eat or drink after midnight. Lab opens at 7:30 am. Please call to schedule an appointment.  If you develop a rapid heart rate you can take an additional metoprolol, if no improvement after one hour go to the ED.  Please go to [email or web address] and review information about heart monitoring.  Your physician wants you to follow-up in: 6 months with Dr. Burt Knack.  You will receive a reminder letter in the mail two months in advance. If you don't receive a letter, please call our office to schedule the follow-up appointment.

## 2015-03-22 NOTE — Progress Notes (Signed)
Cardiology Office Note   Date:  03/24/2015   ID:  Henry Page, DOB Nov 03, 1947, MRN [id number]  PCP:  Glenda Chroman., MD  Cardiologist:  Sherren Mocha, MD    Chief Complaint  Patient presents with  . Coronary Artery Disease     History of Present Illness: Henry Page is a 69 y.o. male who presents for follow-up of CAD, mitral regurgitation, and atrial fibrillation. The patient underwent 4 vessel CABG, mitral valve repair, and cryo- Cox-Maze procedure in July 2014. His postoperative echo showed normal mitral valve function after mitral valve repair. He has maintained sinus rhythm since undergoing a maze procedure.  He has been doing very well. He is very active and has no exertional symptoms. He did have an episode of tachycardia recently that felt like an 'anxiety attack.' His resting heart rate is in the 50's, but he noted that his heart rate was transiently in the 140's. This occurred at rest and lasted 5 minutes. He otherwise feels well and denies episodes of exertional chest pain or shortness of breath. His energy level is good. He is tolerating his medical program and reports good compliance with his medications.   Past Medical History  Diagnosis Date  . Mitral valve prolapse 07/06/2013  . Mitral regurgitation 07/06/2013  . Atrial fibrillation 07/06/2013  . Essential hypertension, benign 07/06/2013  . 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 Heart murmur   . Sleep apnea   . Anxiety   . Shortness of breath   . Kidney stones     some passed spontaneous, one retrieved by cystoscope   . CKD (chronic kidney disease), stage III   . S/P mitral valve repair 07/22/2013    Complex valvuloplasty including triangular resection of posterior leaflet with artificial Gore-tex neocord placement x2 and 43m Sorin Memo 3D ring annuloplasty  . 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  S/P Maze operation for atrial fibrillation 07/22/2013    Complete bilateral atrial lesion set using bipolar radiofrequency and cryothermy ablation with clipping of LA appendage     Past Surgical History  Procedure Laterality Date  . Tonsillectomy    . Tee without cardioversion N/A 07/08/2013    Procedure: TRANSESOPHAGEAL ECHOCARDIOGRAM (TEE);  Surgeon: DLarey Dresser MD;  [place] [facility]  Service: Cardiovascular;  Laterality: N/A;  . Cystoscopy    . Mitral valve repair N/A 07/22/2013    Procedure: MITRAL VALVE REPAIR (MVR);  Surgeon: CRexene Alberts MD;  [place] [facility] [facility]  Service: Open Heart Surgery;  Laterality: N/A;  . Coronary artery bypass graft N/A 07/22/2013    Procedure: CORONARY ARTERY BYPASS GRAFTING (CABG);  Surgeon: CRexene Alberts MD;  [place] [facility]  Service: Open Heart Surgery;  Laterality: N/A;  . Maze N/A 07/22/2013    Procedure: MAZE;  Surgeon: CRexene Alberts MD;  [place] [facility]  Service: Open Heart Surgery;  Laterality: N/A;  . Intraoperative transesophageal echocardiogram N/A 07/22/2013    Procedure: INTRAOPERATIVE TRANSESOPHAGEAL ECHOCARDIOGRAM;  Surgeon: CRexene Alberts MD;  [place] [facility]  Service: Open Heart Surgery;  Laterality: N/A;  . Patent foramen ovale closure N/A 07/22/2013    Procedure: PATENT FORAMEN OVALE CLOSURE;  Surgeon: CRexene Alberts MD;  [place] [facility]  Service: Open Heart Surgery;  Laterality: N/A;  . Left and right heart catheterization with coronary angiogram  07/07/2013  Procedure: LEFT AND RIGHT HEART CATHETERIZATION WITH CORONARY ANGIOGRAM;  Surgeon: Thayer Headings, MD;  Location[facility] C[facility];  Service: Cardiovascular;;    Current Outpatient Prescriptions  Medication Sig Dispense Refill  . aspirin EC 81 MG EC tablet Take 1 tablet (81 mg total) by mouth daily.    Marland Kitchen atorvastatin (LIPITOR) 40 MG tablet TAKE 1 TABLET BY MOUTH EVERY DAY AT 6:00PM 30 tablet 6  . benazepril (LOTENSIN) 10 MG tablet TAKE 1 TABLET BY MOUTH EVERY DAY  30 tablet 6  . hydrochlorothiazide (HYDRODIURIL) 25 MG tablet Take 1 tablet (25 mg total) by mouth daily. 90 tablet 3  . metoprolol tartrate (LOPRESSOR) 25 MG tablet TAKE 1 TABLET BY MOUTH TWICE DAILY 60 tablet 5   No current facility-administered medications for this visit.    Allergies:   Shellfish allergy; Sulfa antibiotics; and Meperidine and related   Social History:  The patient  reports that he has never smoked. He does not have any smokeless tobacco history on file. He reports that he drinks about 1.0 oz of alcohol per week. He reports that he does not use illicit drugs.   Family History:  The patient's family history includes Heart attack in his father.    ROS:  Please see the history of present illness.  All other systems are reviewed and negative.    PHYSICAL EXAM: VS:  BP 158/86 mmHg  Pulse 60  Ht 5' 10" (1.778 m)  Wt 177 lb (80.287 kg)  BMI 25.40 kg/m2 , BMI Body mass index is 25.4 kg/(m^2). GEN: Well nourished, well developed, in no acute distress HEENT: normal Neck: no JVD, no masses. No carotid bruits Cardiac: RRR without murmur or gallop                Respiratory:  clear to auscultation bilaterally, normal work of breathing GI: soft, nontender, nondistended, + BS MS: no deformity or atrophy Ext: no pretibial edema, pedal pulses 2+= bilaterally Skin: warm and dry, no rash Neuro:  Strength and sensation are intact Psych: euthymic mood, full affect  EKG:  EKG is ordered today. The ekg ordered today shows sinus rhythm with occasional PVCs, heart rate 60 bpm.  Recent Labs: 08/27/2014: ALT 18; BUN 27*; Creatinine 1.5; Magnesium 1.9; Potassium 4.1; Sodium 136   Lipid Panel     Component Value Date/Time   CHOL 120 08/27/2014 1050   TRIG 245.0* 08/27/2014 1050   HDL 30.30* 08/27/2014 1050   CHOLHDL 4 08/27/2014 1050   VLDL 49.0* 08/27/2014 1050   LDLCALC 42 02/25/2014 1251   LDLDIRECT 52.5 08/27/2014 1050      Wt Readings from Last 3 Encounters:  03/22/15  177 lb (80.287 kg)  08/27/14 180 lb 12.8 oz (82.01 kg)  08/09/14 183 lb (83.008 kg)     Cardiac Studies Reviewed: 2-D echocardiogram 09/16/2013: Study Conclusions  - Left ventricle: The cavity size was normal. There was mild concentric hypertrophy. Systolic function was normal. The estimated ejection fraction was in the range of 50% to 55%. Wall motion was normal; there were no regional wall motion abnormalities. Features are consistent with a pseudonormal left ventricular filling pattern, with concomitant abnormal relaxation and increased filling pressure (grade 2 diastolic dysfunction). - Ventricular septum: Septal motion showed dyssynergy. These changes are consistent with a post-thoracotomy state. - Aorta: Ascending aortic diameter: 15m (S). - Mitral valve: Prior procedures included surgical repair. Trivial regurgitation. Mean gradient: 366mHg (D). Valve area by pressure half-time: 2.37cm^2. - Left atrium: The atrium was  mildly dilated. - Tricuspid valve: Trivial regurgitation. - Pulmonary arteries: PA peak pressure: 62m Hg (S).  ASSESSMENT AND PLAN: 1. Mitral valve disease status post surgical repair. His exam is within normal limits. His postoperative echo showed an intact repair with no residual mitral regurgitation. He is New York Heart Association functional class I. He will return in 6 months for followup evaluation.  2. Coronary artery disease, native vessel. Patient is stable without symptoms of angina. He's on aspirin, a statin drug, and a beta blocker.   3. Ventricular bigeminy. He still has occasional PVCs on his electrocardiogram. Appears to be asymptomatic.  4. Hypertension, white coat. Home blood pressure readings were reviewed. He ranges from 1196-222systolic over 697-98diastolic.  5. Hyperlipidemia. The patient is on atorvastatin 40 mg. The patient is due for a lipid panel and this will be ordered.  6. Tachypalpitations. Recent episode  is suggestive of atrial fibrillation. However, this was an isolated episode. Advised that he can take an additional metoprolol if symptoms recur. Recommended that he go for emergency room evaluation of symptoms continue greater than 1 hour. Alternatively he could come into the office for an EKG during symptoms as long as he feels okay. I gave him information on an [employer] monitor if he is interested.    Current medicines are reviewed with the patient today.  The patient does not have concerns regarding medicines.  The following changes have been made:  no change  Labs/ tests ordered today include:   Orders Placed This Encounter  Procedures  . Comp Met (CMET)  . Lipid Profile  . EKG 12-Lead    Disposition:   FU 6 months  Signed, MSherren Mocha MD  03/24/2015 12:22 AM    [facility] [address] [place]  [id number]: [phone] Fax: [phone]

## 2015-03-24 ENCOUNTER — Encounter: Payer: Self-pay | Admitting: Cardiovascular Disease

## 2015-03-28 ENCOUNTER — Other Ambulatory Visit: Payer: Self-pay | Admitting: Cardiovascular Disease

## 2015-04-26 ENCOUNTER — Other Ambulatory Visit (INDEPENDENT_AMBULATORY_CARE_PROVIDER_SITE_OTHER): Payer: BLUE CROSS/BLUE SHIELD | Admitting: *Deleted

## 2015-04-26 DIAGNOSIS — I2510 Atherosclerotic heart disease of native coronary artery without angina pectoris: Secondary | ICD-10-CM | POA: Diagnosis not present

## 2015-04-26 DIAGNOSIS — I10 Essential (primary) hypertension: Secondary | ICD-10-CM | POA: Diagnosis not present

## 2015-04-26 DIAGNOSIS — E785 Hyperlipidemia, unspecified: Secondary | ICD-10-CM

## 2015-04-26 LAB — COMPREHENSIVE METABOLIC PANEL
ALBUMIN: 3.7 g/dL (ref 3.5–5.2)
ALK PHOS: 78 U/L (ref 39–117)
ALT: 24 U/L (ref 0–53)
AST: 26 U/L (ref 0–37)
BUN: 27 mg/dL — ABNORMAL HIGH (ref 6–23)
CO2: 30 mEq/L (ref 19–32)
Calcium: 9.1 mg/dL (ref 8.4–10.5)
Chloride: 103 mEq/L (ref 96–112)
Creatinine, Ser: 1.37 mg/dL (ref 0.40–1.50)
GFR: 54.84 mL/min — ABNORMAL LOW (ref >60.00)
GLUCOSE: 98 mg/dL (ref 70–99)
Potassium: 4.1 mEq/L (ref 3.5–5.1)
Sodium: 137 mEq/L (ref 135–145)
Total Bilirubin: 0.5 mg/dL (ref 0.2–1.2)
Total Protein: 6.9 g/dL (ref 6.0–8.3)

## 2015-04-26 LAB — LIPID PANEL
CHOL/HDL RATIO: 4
Cholesterol: 119 mg/dL (ref 0–200)
HDL: 30.2 mg/dL — AB (ref >39.00)
NONHDL: 88.8
TRIGLYCERIDES: 259 mg/dL — AB (ref 0.0–149.0)
VLDL: 51.8 mg/dL — ABNORMAL HIGH (ref 0.0–40.0)

## 2015-04-26 LAB — LDL CHOLESTEROL, DIRECT: LDL DIRECT: 42 mg/dL

## 2015-04-26 NOTE — Addendum Note (Signed)
Addended by: Eulis Foster on: 04/26/2015 09:50 AM   Modules accepted: Orders

## 2015-05-27 ENCOUNTER — Other Ambulatory Visit: Payer: Self-pay | Admitting: Cardiovascular Disease

## 2015-06-20 ENCOUNTER — Other Ambulatory Visit: Payer: Self-pay

## 2015-07-26 ENCOUNTER — Other Ambulatory Visit: Payer: Self-pay | Admitting: Cardiovascular Disease

## 2015-08-15 ENCOUNTER — Encounter: Payer: Self-pay | Admitting: Thoracic Surgery (Cardiothoracic Vascular Surgery)

## 2015-08-15 ENCOUNTER — Ambulatory Visit (INDEPENDENT_AMBULATORY_CARE_PROVIDER_SITE_OTHER): Payer: BLUE CROSS/BLUE SHIELD | Admitting: Thoracic Surgery (Cardiothoracic Vascular Surgery)

## 2015-08-15 VITALS — BP 176/100 | HR 76 | Resp 20 | Ht 71.0 in | Wt 175.0 lb

## 2015-08-15 DIAGNOSIS — Z9889 Other specified postprocedural states: Secondary | ICD-10-CM

## 2015-08-15 DIAGNOSIS — I340 Nonrheumatic mitral (valve) insufficiency: Secondary | ICD-10-CM

## 2015-08-15 DIAGNOSIS — I2510 Atherosclerotic heart disease of native coronary artery without angina pectoris: Secondary | ICD-10-CM

## 2015-08-15 DIAGNOSIS — Z8679 Personal history of other diseases of the circulatory system: Secondary | ICD-10-CM

## 2015-08-15 DIAGNOSIS — Z8774 Personal history of (corrected) congenital malformations of heart and circulatory system: Secondary | ICD-10-CM

## 2015-08-15 DIAGNOSIS — Z951 Presence of aortocoronary bypass graft: Secondary | ICD-10-CM | POA: Diagnosis not present

## 2015-08-15 NOTE — Patient Instructions (Signed)

## 2015-08-15 NOTE — Progress Notes (Signed)
      [address]       ,[place] [address]             [place] OFFICE NOTE  Referring Provider is Fay Records, MD  Primary Cardiologist is Sherren Mocha, MD PCP is Glenda Chroman., MD   HPI:  Patient returns to the office today for routine follow-up approximately 2 years status post mitral valve repair, coronary artery bypass grafting 4, Maze procedure, and closure of patent foramen ovale on 07/22/2013. Late follow-up echocardiogram performed in 2014 revealed normal left ventricular systolic function with intact mitral valve repair. He was last seen here in our office on 08/09/2014.  He continues to follow-up closely with Dr. Burt Knack who saw him most recently on 03/22/2015. At that time the patient reported a single episode of tachycardia palpitations that now was more than 6 months ago. The patient states that he feels this was a panic attack. He has not had any episodes of tachypalpitations since that time. The patient reports feeling quite well. He reports normal exercise tolerance with no significant physical limitations. He does not experience any significant exertional chest discomfort or shortness of breath.. He is quite pleased with his overall health and exercise capacity.   Current Outpatient Prescriptions  Medication Sig Dispense Refill  . aspirin EC 81 MG EC tablet Take 1 tablet (81 mg total) by mouth daily.    Marland Kitchen atorvastatin (LIPITOR) 40 MG tablet TAKE 1 TABLET BY MOUTH EVERY DAY AT 6:00PM 30 tablet 5  . benazepril (LOTENSIN) 10 MG tablet TAKE 1 TABLET BY MOUTH EVERY DAY 30 tablet 5  . hydrochlorothiazide (HYDRODIURIL) 25 MG tablet TAKE 1 TABLET BY MOUTH EVERY DAY 90 tablet 0  . metoprolol tartrate (LOPRESSOR) 25 MG tablet TAKE 1 TABLET BY MOUTH TWICE DAILY 60 tablet 5   No current facility-administered medications for this visit.      Physical Exam:   BP 176/100 mmHg  Pulse 76  Resp 20  Ht 5\' 11"  (1.803 m)  Wt 175  lb (79.379 kg)  BMI 24.42 kg/m2  SpO2 98%  General:  Well-appearing  Chest:   clear  CV:   Regular rate and rhythm without murmur  Incisions:  Completely healed  Abdomen:  Soft and nontender  Extremities:  Warm and well-perfused  Diagnostic Tests:  2 channel telemetry rhythm strip demonstrates normal sinus rhythm   Impression:  Patient is doing very well more than 2 years status post mitral valve repair, coronary artery bypass grafting, and Maze procedure. He is maintaining sinus rhythm and his exercise tolerance is quite good.    Plan:  In the future the patient will call and return to see [place] as needed. He will continue to follow-up regular with Dr. Burt Knack. He will be referred to the atrial fibrillation clinic for long-term surveillance following maze procedure. The patient has been reminded regarding the need for anti-biotic prophylaxis for all dental cleaning and related procedures. All of his questions been addressed.  I spent in excess of 15 minutes during the conduct of this office consultation and >50% of this time involved direct face-to-face encounter with the patient for counseling and/or coordination of their care.  Valentina Gu. Roxy Manns, MD 08/15/2015 11:08 AM

## 2015-09-26 ENCOUNTER — Other Ambulatory Visit: Payer: Self-pay | Admitting: Cardiovascular Disease

## 2015-10-14 ENCOUNTER — Encounter: Payer: Self-pay | Admitting: Cardiovascular Disease

## 2015-10-14 ENCOUNTER — Ambulatory Visit (INDEPENDENT_AMBULATORY_CARE_PROVIDER_SITE_OTHER): Payer: BLUE CROSS/BLUE SHIELD | Admitting: Cardiovascular Disease

## 2015-10-14 VITALS — BP 162/94 | HR 62 | Ht 70.0 in | Wt 183.8 lb

## 2015-10-14 DIAGNOSIS — I2510 Atherosclerotic heart disease of native coronary artery without angina pectoris: Secondary | ICD-10-CM | POA: Diagnosis not present

## 2015-10-14 DIAGNOSIS — I10 Essential (primary) hypertension: Secondary | ICD-10-CM | POA: Diagnosis not present

## 2015-10-14 NOTE — Patient Instructions (Signed)
Medication Instructions:  Your physician recommends that you continue on your current medications as directed. Please refer to the Current Medication list given to you today.  Labwork: Your physician recommends that you return for a FASTING CMP and LIPID in 6 MONTHS--nothing to eat or drink after midnight, lab opens at 7:30 AM.  Testing/Procedures: No new orders.   Follow-Up: Your physician wants you to follow-up in: 6 MONTHS with Dr Burt Knack.  You will receive a reminder letter in the mail two months in advance. If you don't receive a letter, please call our office to schedule the follow-up appointment.   Any Other Special Instructions Will Be Listed Below (If Applicable).

## 2015-10-16 ENCOUNTER — Encounter: Payer: Self-pay | Admitting: Cardiovascular Disease

## 2015-10-16 NOTE — Progress Notes (Signed)
Cardiology Office Note Date:  10/16/2015   ID:  Henry Page, DOB 10/25/1947, MRN [id number]  PCP:  Glenda Chroman., MD  Cardiologist:  Sherren Mocha, MD    No chief complaint on file.    History of Present Illness: Henry Page is a 69 y.o. male who presents for follow-up evaluation. He has a history of CAD, mitral regurgitation, and atrial fibrillation. The patient underwent 4 vessel CABG, mitral valve repair, and cryo- Cox-Maze procedure in July 2014. His postoperative echo showed normal mitral valve function after mitral valve repair. He has maintained sinus rhythm since undergoing a maze procedure.  He has been doing well. Had an episode of tachypalpitations prior to his last visit here, but no problems since that time. In retrospect he feels it was a 'panic attack.' Currently he denies CP, shortness of breath, or edema. He feels well. Long hx of white coat HTN and BP this am at home was 120/70.    Past Medical History  Diagnosis Date  . Mitral valve prolapse 07/06/2013  . Mitral regurgitation 07/06/2013  . Atrial fibrillation ([place]) 07/06/2013  . Essential hypertension, benign 07/06/2013  . Acute combined systolic and diastolic congestive heart failure ([place]) 07/06/2013  . Heart murmur   . Sleep apnea   . Anxiety   . Shortness of breath   . Kidney stones     some passed spontaneous, one retrieved by cystoscope   . CKD (chronic kidney disease), stage III   . S/P mitral valve repair 07/22/2013    Complex valvuloplasty including triangular resection of posterior leaflet with artificial Gore-tex neocord placement x2 and 2m Sorin Memo 3D ring annuloplasty  . 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 S/P Maze operation for atrial fibrillation 07/22/2013    Complete bilateral atrial lesion set using bipolar radiofrequency and cryothermy ablation with clipping of LA appendage     Past Surgical History    Procedure Laterality Date  . Tonsillectomy    . Tee without cardioversion N/A 07/08/2013    Procedure: TRANSESOPHAGEAL ECHOCARDIOGRAM (TEE);  Surgeon: DLarey Dresser MD;  [facility]  Service: Cardiovascular;  Laterality: N/A;  . Cystoscopy    . Mitral valve repair N/A 07/22/2013    Procedure: MITRAL VALVE REPAIR (MVR);  Surgeon: CRexene Alberts MD;  [facility] [facility]  Service: Open Heart Surgery;  Laterality: N/A;  . Coronary artery bypass graft N/A 07/22/2013    Procedure: CORONARY ARTERY BYPASS GRAFTING (CABG);  Surgeon: CRexene Alberts MD;  [facility]  Service: Open Heart Surgery;  Laterality: N/A;  . Maze N/A 07/22/2013    Procedure: MAZE;  Surgeon: CRexene Alberts MD;  [facility]  Service: Open Heart Surgery;  Laterality: N/A;  . Intraoperative transesophageal echocardiogram N/A 07/22/2013    Procedure: INTRAOPERATIVE TRANSESOPHAGEAL ECHOCARDIOGRAM;  Surgeon: CRexene Alberts MD;  [facility] [facility]  Service: Open Heart Surgery;  Laterality: N/A;  . Patent foramen ovale closure N/A 07/22/2013    Procedure: PATENT FORAMEN OVALE CLOSURE;  Surgeon: CRexene Alberts MD;  [facility] [facility]  Service: Open Heart Surgery;  Laterality: N/A;  . Left and right heart catheterization with coronary angiogram  07/07/2013    Procedure: LEFT AND RIGHT HEART CATHETERIZATION WITH CORONARY ANGIOGRAM;  Surgeon: PThayer Headings MD;  [facility];  Service: Cardiovascular;;    Current Outpatient Prescriptions  Medication  Sig Dispense Refill  . aspirin EC 81 MG EC tablet Take 1 tablet (81 mg total) by mouth daily.    Marland Kitchen atorvastatin (LIPITOR) 40 MG tablet TAKE 1 TABLET BY MOUTH EVERY DAY AT 6:00PM 30 tablet 5  . benazepril (LOTENSIN) 10 MG tablet TAKE 1 TABLET BY MOUTH EVERY DAY 30 tablet 5  . hydrochlorothiazide (HYDRODIURIL) 25 MG tablet TAKE 1 TABLET BY MOUTH EVERY DAY 90 tablet 0  . metoprolol tartrate (LOPRESSOR) 25 MG tablet TAKE 1 TABLET BY MOUTH TWICE DAILY 60 tablet 0    No current facility-administered medications for this visit.    Allergies:   Shellfish allergy; Sulfa antibiotics; and Meperidine and related   Social History:  The patient  reports that he has never smoked. He does not have any smokeless tobacco history on file. He reports that he drinks about 1.0 oz of alcohol per week. He reports that he does not use illicit drugs.   Family History:  The patient's  family history includes Heart attack in his father.    ROS:  Please see the history of present illness.  All other systems are reviewed and negative.    PHYSICAL EXAM: VS:  BP 162/94 mmHg  Pulse 62  Ht _0  (1.778 m)  Wt 183 lb 12.8 oz (83.371 kg)  BMI 26.37 kg/m2 , BMI Body mass index is 26.37 kg/(m^2). GEN: Well nourished, well developed, in no acute distress HEENT: normal Neck: no JVD, no masses. No carotid bruits Cardiac: RRR without murmur or gallop                Respiratory:  clear to auscultation bilaterally, normal work of breathing GI: soft, nontender, nondistended, + BS MS: no deformity or atrophy Ext: no pretibial edema, pedal pulses 2+= bilaterally Skin: warm and dry, no rash Neuro:  Strength and sensation are intact Psych: euthymic mood, full affect  EKG:  EKG is ordered today. The ekg ordered today shows NSr 62 bpm, within normal limits  Recent Labs: 04/26/2015: ALT 24; BUN 27*; Creatinine, Ser 1.37; Potassium 4.1; Sodium 137   Lipid Panel     Component Value Date/Time   CHOL 119 04/26/2015 0950   TRIG 259.0* 04/26/2015 0950   HDL 30.20* 04/26/2015 0950   CHOLHDL 4 04/26/2015 0950   VLDL 51.8* 04/26/2015 0950   LDLCALC 42 02/25/2014 1251   LDLDIRECT 42.0 04/26/2015 0950      Wt Readings from Last 3 Encounters:  10/14/15 183 lb 12.8 oz (83.371 kg)  08/15/15 175 lb (79.379 kg)  03/22/15 177 lb (80.287 kg)     Cardiac Studies Reviewed: 2D Echo 09/16/2013: - Left ventricle: The cavity size was normal. There was mild concentric hypertrophy.  Systolic function was normal. The estimated ejection fraction was in the range of 50% to 55%. Wall motion was normal; there were no regional wall motion abnormalities. Features are consistent with a pseudonormal left ventricular filling pattern, with concomitant abnormal relaxation and increased filling pressure (grade 2 diastolic dysfunction). - Ventricular septum: Septal motion showed dyssynergy. These changes are consistent with a post-thoracotomy state. - Aorta: Ascending aortic diameter: 66m (S). - Mitral valve: Prior procedures included surgical repair. Trivial regurgitation. Mean gradient: 381mHg (D). Valve area by pressure half-time: 2.37cm^2. - Left atrium: The atrium was mildly dilated. - Tricuspid valve: Trivial regurgitation. - Pulmonary arteries: PA peak pressure: 3225mg (S).  ASSESSMENT AND PLAN: 1.  Mitral valve disease status post surgical repair. His exam is within normal limits. His postoperative  echo showed an intact repair with no residual mitral regurgitation. He is New York Heart Association functional class I. He will return in 6 months for followup evaluation.  2. Coronary artery disease, native vessel. Patient is stable without symptoms of angina. He's on aspirin, a statin drug, and a beta blocker.   3. Ventricular bigeminy. No PVC's on today's EKG. Appears to be asymptomatic.  4. Hypertension, white coat. Home blood pressure readings consistently less than 120/80 per his report. He monitors BP regularly.  5. Hyperlipidemia. The patient is on atorvastatin 40 mg. Last labs reviewed and lipids at goal. Will repeat when he returns in 6 months.  Current medicines are reviewed with the patient today.  The patient does not have concerns regarding medicines.  Labs/ tests ordered today include:  Orders Placed This Encounter  Procedures  . Lipid panel  . Comp Met (CMET)  . EKG 12-Lead    Disposition:   FU 6 months  Signed, Sherren Mocha, MD  10/16/2015 7:39 PM    [address], [place], [place]  [address] Phone: [phone]; Fax: [phone]

## 2015-10-17 ENCOUNTER — Ambulatory Visit: Payer: Self-pay | Admitting: Cardiovascular Disease

## 2015-10-24 ENCOUNTER — Other Ambulatory Visit: Payer: Self-pay | Admitting: Cardiovascular Disease

## 2015-11-23 ENCOUNTER — Other Ambulatory Visit: Payer: Self-pay | Admitting: Cardiovascular Disease

## 2016-05-03 ENCOUNTER — Ambulatory Visit: Payer: Self-pay | Admitting: Cardiovascular Disease

## 2016-06-05 ENCOUNTER — Other Ambulatory Visit (INDEPENDENT_AMBULATORY_CARE_PROVIDER_SITE_OTHER): Payer: BLUE CROSS/BLUE SHIELD | Admitting: *Deleted

## 2016-06-05 DIAGNOSIS — I5041 Acute combined systolic (congestive) and diastolic (congestive) heart failure: Secondary | ICD-10-CM | POA: Diagnosis not present

## 2016-06-05 DIAGNOSIS — I340 Nonrheumatic mitral (valve) insufficiency: Secondary | ICD-10-CM | POA: Diagnosis not present

## 2016-06-05 DIAGNOSIS — I10 Essential (primary) hypertension: Secondary | ICD-10-CM | POA: Diagnosis not present

## 2016-06-05 LAB — LIPID PANEL
Cholesterol: 122 mg/dL — ABNORMAL LOW (ref 125–200)
HDL: 36 mg/dL — ABNORMAL LOW (ref >=40)
LDL CALC: 36 mg/dL (ref <130)
Total CHOL/HDL Ratio: 3.4 Ratio (ref <=5.0)
Triglycerides: 248 mg/dL — ABNORMAL HIGH (ref <150)
VLDL: 50 mg/dL — AB (ref <30)

## 2016-06-05 LAB — COMPREHENSIVE METABOLIC PANEL
ALT: 23 U/L (ref 9–46)
AST: 22 U/L (ref 10–35)
Albumin: 4.1 g/dL (ref 3.6–5.1)
Alkaline Phosphatase: 83 U/L (ref 40–115)
BILIRUBIN TOTAL: 0.7 mg/dL (ref 0.2–1.2)
BUN: 36 mg/dL — AB (ref 7–25)
CHLORIDE: 100 mmol/L (ref 98–110)
CO2: 26 mmol/L (ref 20–31)
CREATININE: 1.55 mg/dL — AB (ref 0.70–1.25)
Calcium: 8.9 mg/dL (ref 8.6–10.3)
GLUCOSE: 97 mg/dL (ref 65–99)
Potassium: 4.1 mmol/L (ref 3.5–5.3)
SODIUM: 138 mmol/L (ref 135–146)
Total Protein: 6.8 g/dL (ref 6.1–8.1)

## 2016-06-05 NOTE — Addendum Note (Signed)
Addended by: Eulis Foster on: 06/05/2016 10:48 AM   Modules accepted: Orders

## 2016-06-07 ENCOUNTER — Ambulatory Visit (INDEPENDENT_AMBULATORY_CARE_PROVIDER_SITE_OTHER): Payer: BLUE CROSS/BLUE SHIELD | Admitting: Cardiovascular Disease

## 2016-06-07 ENCOUNTER — Encounter: Payer: Self-pay | Admitting: Cardiovascular Disease

## 2016-06-07 VITALS — BP 170/100 | HR 58 | Ht 70.0 in | Wt 182.8 lb

## 2016-06-07 DIAGNOSIS — I2510 Atherosclerotic heart disease of native coronary artery without angina pectoris: Secondary | ICD-10-CM

## 2016-06-07 DIAGNOSIS — I10 Essential (primary) hypertension: Secondary | ICD-10-CM

## 2016-06-07 DIAGNOSIS — I480 Paroxysmal atrial fibrillation: Secondary | ICD-10-CM

## 2016-06-07 NOTE — Patient Instructions (Signed)
Medication Instructions:  Your physician recommends that you continue on your current medications as directed. Please refer to the Current Medication list given to you today.  Labwork: Your physician recommends that you return for lab work in: 6 MONTHS (BMP)  Testing/Procedures: No new orders.   Follow-Up: Your physician wants you to follow-up in: 1 YEAR with Dr Burt Knack.  You will receive a reminder letter in the mail two months in advance. If you don't receive a letter, please call our office to schedule the follow-up appointment.   Any Other Special Instructions Will Be Listed Below (If Applicable).  Please increase your fluid intake.     If you need a refill on your cardiac medications before your next appointment, please call your pharmacy.

## 2016-06-07 NOTE — Progress Notes (Signed)
Cardiology Office Note Date:  06/07/2016   ID:  Henry Page, DOB 10/25/1947, MRN [id number]  PCP:  Glenda Chroman, MD  Cardiologist:  Sherren Mocha, MD    Chief Complaint  Patient presents with  . Hypertension  . Mitral Regurgitation     History of Present Illness: Henry Page is a 70 y.o. male who presents for follow-up evaluation. He has a history of CAD, mitral regurgitation, and atrial fibrillation. The patient underwent 4 vessel CABG, mitral valve repair, and cryo- Cox-Maze procedure in 2014. His postoperative echo showed normal mitral valve function after mitral valve repair. He has maintained sinus rhythm since undergoing a maze procedure.  The patient is doing very well. 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 He has occasional lightheadedness when he first gets up but states that this is pretty minor. He denies chest pain, chest pressure, shortness of breath, or leg swelling.  Past Medical History  Diagnosis Date  . Mitral valve prolapse 07/06/2013  . Mitral regurgitation 07/06/2013  . Atrial fibrillation ([place]) 07/06/2013  . Essential hypertension, benign 07/06/2013  . Acute combined systolic and diastolic congestive heart failure ([place]) 07/06/2013  . Heart murmur   . Sleep apnea   . Anxiety   . Shortness of breath   . Kidney stones     some passed spontaneous, one retrieved by cystoscope   . CKD (chronic kidney disease), stage III   . S/P mitral valve repair 07/22/2013    Complex valvuloplasty including triangular resection of posterior leaflet with artificial Gore-tex neocord placement x2 and 78mm Sorin Memo 3D ring annuloplasty  . 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 S/P Maze operation for atrial fibrillation 07/22/2013    Complete bilateral atrial lesion set using bipolar radiofrequency and cryothermy ablation with clipping of LA appendage      Past Surgical History  Procedure Laterality Date  . Tonsillectomy    . Tee without cardioversion N/A 07/08/2013    Procedure: TRANSESOPHAGEAL ECHOCARDIOGRAM (TEE);  Surgeon: Larey Dresser, MD;  Location: [place];  Service: Cardiovascular;  Laterality: N/A;  . Cystoscopy    . Mitral valve repair N/A 07/22/2013    Procedure: MITRAL VALVE REPAIR (MVR);  Surgeon: Rexene Alberts, MD;  Location: [place];  Service: Open Heart Surgery;  Laterality: N/A;  . Coronary artery bypass graft N/A 07/22/2013    Procedure: CORONARY ARTERY BYPASS GRAFTING (CABG);  Surgeon: Rexene Alberts, MD;  Location: [place];  Service: Open Heart Surgery;  Laterality: N/A;  . Maze N/A 07/22/2013    Procedure: MAZE;  Surgeon: Rexene Alberts, MD;  Location: [place];  Service: Open Heart Surgery;  Laterality: N/A;  . Intraoperative transesophageal echocardiogram N/A 07/22/2013    Procedure: INTRAOPERATIVE TRANSESOPHAGEAL ECHOCARDIOGRAM;  Surgeon: Rexene Alberts, MD;  Location: [place];  Service: Open Heart Surgery;  Laterality: N/A;  . Patent foramen ovale closure N/A 07/22/2013    Procedure: PATENT FORAMEN OVALE CLOSURE;  Surgeon: Rexene Alberts, MD;  Location: [place];  Service: Open Heart Surgery;  Laterality: N/A;  . Left and right heart catheterization with coronary angiogram  07/07/2013    Procedure: LEFT AND RIGHT HEART CATHETERIZATION WITH CORONARY ANGIOGRAM;  Surgeon: Thayer Headings, MD;  Location: [facility];  Service: Cardiovascular;;    Current Outpatient Prescriptions  Medication Sig Dispense  Refill  . amoxicillin (AMOXIL) 500 MG capsule Take 2,000 mg by mouth as directed. 1 hour prior to dental appointments  99  . aspirin EC 81 MG EC tablet Take 1 tablet (81 mg total) by mouth daily.    Marland Kitchen atorvastatin (LIPITOR) 40 MG tablet TAKE 1 TABLET BY MOUTH EVERY DAY AT 6:00PM 30 tablet 10  . benazepril (LOTENSIN) 10 MG tablet TAKE 1 TABLET BY MOUTH EVERY DAY 30 tablet 10  . hydrochlorothiazide (HYDRODIURIL) 25 MG  tablet TAKE 1 TABLET BY MOUTH EVERY DAY 90 tablet 3  . metoprolol tartrate (LOPRESSOR) 25 MG tablet TAKE 1 TABLET BY MOUTH TWICE DAILY 60 tablet 11   No current facility-administered medications for this visit.    Allergies:   Shellfish allergy; Sulfa antibiotics; and Meperidine and related   Social History:  The patient  reports that he has never smoked. He does not have any smokeless tobacco history on file. He reports that he drinks about 1.0 oz of alcohol per week. He reports that he does not use illicit drugs.   Family History:  The patient's  family history includes Heart attack in his father.    ROS:  Please see the history of present illness.  All other systems are reviewed and negative.    PHYSICAL EXAM: VS:  BP 170/100 mmHg  Pulse 58  Ht 5\' 10"  (1.778 m)  Wt 182 lb 12.8 oz (82.918 kg)  BMI 26.23 kg/m2 , BMI Body mass index is 26.23 kg/(m^2). GEN: Well nourished, well developed, in no acute distress HEENT: normal Neck: no JVD, no masses. No carotid bruits Cardiac: RRR without murmur or gallop                Respiratory:  clear to auscultation bilaterally, normal work of breathing GI: soft, nontender, nondistended, + BS MS: no deformity or atrophy Ext: no pretibial edema, pedal pulses 2+= bilaterally Skin: warm and dry, no rash Neuro:  Strength and sensation are intact Psych: euthymic mood, full affect  EKG:  EKG is ordered today. The ekg ordered today shows sinus rhythm 59 bpm, rightward axis, low-voltage QRS  Recent Labs: 06/05/2016: ALT 23; BUN 36*; Creat 1.55*; Potassium 4.1; Sodium 138   Lipid Panel     Component Value Date/Time   CHOL 122* 06/05/2016 1048   TRIG 248* 06/05/2016 1048   HDL 36* 06/05/2016 1048   CHOLHDL 3.4 06/05/2016 1048   VLDL 50* 06/05/2016 1048   LDLCALC 36 06/05/2016 1048   LDLDIRECT 42.0 04/26/2015 0950      Wt Readings from Last 3 Encounters:  06/07/16 182 lb 12.8 oz (82.918 kg)  10/14/15 183 lb 12.8 oz (83.371 kg)  08/15/15  175 lb (79.379 kg)     ASSESSMENT AND PLAN: 1.  Mitral valve disease status post mitral valve repair: NYHA functional class I. Postoperative echo showed normal valve function and normal LV function. I will see him back in one year.  2. Coronary artery disease, native vessel: Stable without symptoms of angina. Medical program reviewed and is appropriate.  3. [place] hypertension: Blood pressure is controlled at home onbenazepril, hydrochlorothiazide, and metoprolol. He reports a general blood pressure range of 120-125/65-70. He has had occasional blood pressures as low as 90/50.  4. Hyperlipidemia: The patient is treated with atorvastatin 40 mg. Lipids reviewed and are at goal. We discussed lifestyle modification for treatment of hypertriglyceridemia.  5. Chronic kidney disease stage III: We discussed the importance of adequate hydration. The patient is on benazepril. Will  follow with 6 month labs.  Current medicines are reviewed with the patient today.  The patient does not have concerns regarding medicines.  Labs/ tests ordered today include:   Orders Placed This Encounter  Procedures  . Basic Metabolic Panel (BMET)  . EKG 12-Lead    Disposition:   FU 6 month BMET, one year office visit  Signed, Sherren Mocha, MD  06/07/2016 1:26 PM    [facility] [place], [place], [place]  [address] Phone: [phone]; Fax: [phone]

## 2016-10-18 ENCOUNTER — Other Ambulatory Visit: Payer: Self-pay | Admitting: Cardiovascular Disease

## 2016-10-20 DIAGNOSIS — Z23 Encounter for immunization: Secondary | ICD-10-CM | POA: Diagnosis not present

## 2016-12-07 ENCOUNTER — Other Ambulatory Visit: Payer: Self-pay

## 2016-12-11 ENCOUNTER — Other Ambulatory Visit: Payer: BLUE CROSS/BLUE SHIELD | Admitting: *Deleted

## 2016-12-11 DIAGNOSIS — I2510 Atherosclerotic heart disease of native coronary artery without angina pectoris: Secondary | ICD-10-CM

## 2016-12-11 DIAGNOSIS — I10 Essential (primary) hypertension: Secondary | ICD-10-CM

## 2016-12-11 LAB — BASIC METABOLIC PANEL
BUN: 29 mg/dL — AB (ref 7–25)
CO2: 27 mmol/L (ref 20–31)
Calcium: 8.8 mg/dL (ref 8.6–10.3)
Chloride: 102 mmol/L (ref 98–110)
Creat: 1.49 mg/dL — ABNORMAL HIGH (ref 0.70–1.18)
GLUCOSE: 100 mg/dL — AB (ref 65–99)
Potassium: 3.9 mmol/L (ref 3.5–5.3)
Sodium: 140 mmol/L (ref 135–146)

## 2017-01-21 ENCOUNTER — Telehealth: Payer: Self-pay | Admitting: Cardiovascular Disease

## 2017-01-21 NOTE — Telephone Encounter (Signed)
New message      Wife states that this weekend pt's pulse was up to 122. Please call

## 2017-01-21 NOTE — Telephone Encounter (Signed)
I spoke with the pt and he complains of flu like symptoms that started last Friday. The pt had fever (highest 101), chills, dizziness and muscle aches over the weekend.  The pt monitors his BP and pulse and his pulse was elevated as high as 122 (pt has Hx of AFib).  The pt had some Cipro at home and he started this medication, plain zyrtec and tylenol.  Today the pt's symptoms are improving.  He is not 100% but is taking it easy. Bp today 105/69, pulse 69. In reviewing the pt's medications he said that he has not taken his evening dose of Metoprolol in some time due to lower BP and pulse in the 50's.  He states Dr Burt Knack is aware of this information.  I advised him that his illness could have contributed to increased pulse.  I advised him to remain well hydrated and take it easy today. The pt will continue to monitor vital signs and if he notices that they continue to fluctuate then he will contact the office.  The pt also said he has issues with anxiety and wondered if Dr Burt Knack would prescribe medication.  I instructed him that this would need to be addressed with PCP.  I will forward this message to Dr Burt Knack for review and any further recommendations.

## 2017-01-21 NOTE — Telephone Encounter (Signed)
I spoke with the pt and made him aware of Dr Antionette Char comments. The pt said his pulse has remained in the 60s today.  He will contact the office with any other questions or concerns.

## 2017-01-21 NOTE — Telephone Encounter (Signed)
Agree - all of these issues best addressed by his PCP. If heart rate elevation/irregularity is an issue we could do an EKG to screen for afib which he has had in the past

## 2017-02-04 ENCOUNTER — Telehealth: Payer: Self-pay | Admitting: Cardiovascular Disease

## 2017-02-04 ENCOUNTER — Ambulatory Visit (INDEPENDENT_AMBULATORY_CARE_PROVIDER_SITE_OTHER): Payer: BLUE CROSS/BLUE SHIELD

## 2017-02-04 ENCOUNTER — Encounter (INDEPENDENT_AMBULATORY_CARE_PROVIDER_SITE_OTHER): Payer: Self-pay

## 2017-02-04 VITALS — BP 152/88 | HR 121 | Ht 70.0 in | Wt 180.5 lb

## 2017-02-04 DIAGNOSIS — I4891 Unspecified atrial fibrillation: Secondary | ICD-10-CM | POA: Diagnosis not present

## 2017-02-04 MED ORDER — APIXABAN 5 MG PO TABS: 5.0000 mg | ORAL_TABLET | Freq: Two times a day (BID) | ORAL | 0 refills | Status: DC

## 2017-02-04 NOTE — Patient Instructions (Signed)
Medication Instructions:  Your physician has recommended you make the following change in your medication:  1. STOP Aspirin 2. START Eliquis 5mg  take one tablet by mouth twice a day  Labwork: Your physician recommends that you have lab work today: BMP and CBC  Testing/Procedures: Your physician has requested that you have a TEE/Cardioversion. During a TEE, sound waves are used to create images of your heart. It provides your doctor with information about the size and shape of your heart and how well your heart's chambers and valves are working. In this test, a transducer is attached to the end of a flexible tube that is guided down you throat and into your esophagus (the tube leading from your mouth to your stomach) to get a more detailed image of your heart. Once the TEE has determined that a blood clot is not present, the cardioversion begins. Electrical Cardioversion uses a jolt of electricity to your heart either through paddles or wired patches attached to your chest. This is a controlled, usually prescheduled, procedure. This procedure is done at the hospital and you are not awake during the procedure. You usually go home the day of the procedure. Please see the instruction sheet given to you today for more information.  Follow-Up: We will arrange further follow-up after TEE/Cardioversion.  Any Other Special Instructions Will Be Listed Below (If Applicable).     If you need a refill on your cardiac medications before your next appointment, please call your pharmacy.

## 2017-02-04 NOTE — Telephone Encounter (Signed)
New message    Pt wife calling asking if you could call her about pt EKG from today. She had a question

## 2017-02-04 NOTE — Telephone Encounter (Signed)
Mrs. Henry Page is calling on behalf of patient (husband) she states that his "pulse rate is jumping all over." Patient wanted to see Dr. Burt Page but his earliest time I saw was in April, patient declined to see Pa. [name] would like to try to be worked in sometime this week. Please call, thanks.

## 2017-02-04 NOTE — Telephone Encounter (Signed)
I spoke with the pt's wife and yesterday the pt got up quickly and became dizzy.  She said the pt's BP today was 114/88, pulse 79 and his heart rate is bouncing all over the place.  Per last note Dr Burt Knack recommended an EKG if heart rate remained an issue. I advised that I can performed an EKG today at 3:00, appointment scheduled.

## 2017-02-04 NOTE — Telephone Encounter (Signed)
Please see documentation in Nurse Room Visit.  EKG showed AFib with RVR, pulse 121.

## 2017-02-04 NOTE — Progress Notes (Signed)
Pt came into the office to have EKG performed.  EKG shows AFib with RVR 121.  Pt denies CP or SOB, just complains of anxious feeling in chest since he had the flu a few weeks ago. The pt was previously taking his Metoprolol Succinate 25mg  just once a day but since he noticed his heart rate constantly changing he began taking the medication twice a day.  Per pt the second dose drops his BP to 80/50 range but he is asymptomatic. I discussed this pt with Dr Meda Coffee and pt to STOP Aspirin, START Eliquis 5mg  take one tablet by mouth twice a day and plan for TEE/DCCV on Friday.  Pt made aware of plan.  4 week supply of samples given for Eliquis and 30 day Free card also given. Pt scheduled for TEE/DCCV on 02/08/17 at 10:00 with Dr Meda Coffee, pre labs drawn today.

## 2017-02-04 NOTE — Telephone Encounter (Signed)
I spoke with the pt's wife and made her aware of EKG findings and answered questions in regards to TEE/Cardioversion.  She is scheduled for surgery next week and wanted to know if she needed to reschedule due to the pt having procedure on Friday.  I advised her that at this time there is not a reason why she cannot go ahead with plans for next week. The pt will only have restrictions on Friday after procedure.

## 2017-02-05 LAB — CBC
Hematocrit: 42.2 % (ref 37.5–51.0)
Hemoglobin: 14.4 g/dL (ref 13.0–17.7)
MCH: 32.4 pg (ref 26.6–33.0)
MCHC: 34.1 g/dL (ref 31.5–35.7)
MCV: 95 fL (ref 79–97)
Platelets: 237 10*3/uL (ref 150–379)
RBC: 4.44 x10E6/uL (ref 4.14–5.80)
RDW: 13.7 % (ref 12.3–15.4)
WBC: 9.3 10*3/uL (ref 3.4–10.8)

## 2017-02-05 LAB — BASIC METABOLIC PANEL
BUN/Creatinine Ratio: 19 (ref 10–24)
BUN: 26 mg/dL (ref 8–27)
CO2: 26 mmol/L (ref 18–29)
Calcium: 9.8 mg/dL (ref 8.6–10.2)
Chloride: 97 mmol/L (ref 96–106)
Creatinine, Ser: 1.38 mg/dL — ABNORMAL HIGH (ref 0.76–1.27)
GFR calc Af Amer: 59 mL/min/{1.73_m2} — ABNORMAL LOW (ref >59)
GFR calc non Af Amer: 51 mL/min/{1.73_m2} — ABNORMAL LOW (ref >59)
Glucose: 81 mg/dL (ref 65–99)
Potassium: 4.5 mmol/L (ref 3.5–5.2)
Sodium: 140 mmol/L (ref 134–144)

## 2017-02-05 NOTE — Addendum Note (Signed)
Addended by: Dorothy Spark on: 02/05/2017 09:34 AM   Modules accepted: Orders

## 2017-02-07 ENCOUNTER — Encounter: Payer: Self-pay | Admitting: Cardiology

## 2017-02-08 ENCOUNTER — Encounter (HOSPITAL_COMMUNITY)
Admission: RE | Disposition: A | Discharge status: Home / Self Care | Payer: Self-pay | Source: Ambulatory Visit | Attending: Cardiology

## 2017-02-08 ENCOUNTER — Encounter (HOSPITAL_COMMUNITY): Payer: Self-pay | Admitting: *Deleted

## 2017-02-08 ENCOUNTER — Ambulatory Visit (HOSPITAL_COMMUNITY): Payer: BLUE CROSS/BLUE SHIELD | Admitting: Anesthesiology

## 2017-02-08 ENCOUNTER — Ambulatory Visit (HOSPITAL_COMMUNITY)
Admission: RE | Admit: 2017-02-08 | Discharge: 2017-02-08 | Disposition: A | Discharge status: Home / Self Care | Payer: BLUE CROSS/BLUE SHIELD | Source: Ambulatory Visit | Attending: Cardiology | Admitting: Cardiology

## 2017-02-08 ENCOUNTER — Ambulatory Visit (HOSPITAL_BASED_OUTPATIENT_CLINIC_OR_DEPARTMENT_OTHER): Payer: BLUE CROSS/BLUE SHIELD

## 2017-02-08 DIAGNOSIS — I130 Hypertensive heart and chronic kidney disease with heart failure and stage 1 through stage 4 chronic kidney disease, or unspecified chronic kidney disease: Secondary | ICD-10-CM | POA: Insufficient documentation

## 2017-02-08 DIAGNOSIS — N183 Chronic kidney disease, stage 3 (moderate): Secondary | ICD-10-CM | POA: Diagnosis not present

## 2017-02-08 DIAGNOSIS — I5042 Chronic combined systolic (congestive) and diastolic (congestive) heart failure: Secondary | ICD-10-CM | POA: Diagnosis not present

## 2017-02-08 DIAGNOSIS — Z951 Presence of aortocoronary bypass graft: Secondary | ICD-10-CM | POA: Insufficient documentation

## 2017-02-08 DIAGNOSIS — I341 Nonrheumatic mitral (valve) prolapse: Secondary | ICD-10-CM | POA: Insufficient documentation

## 2017-02-08 DIAGNOSIS — Z8249 Family history of ischemic heart disease and other diseases of the circulatory system: Secondary | ICD-10-CM | POA: Insufficient documentation

## 2017-02-08 DIAGNOSIS — Z87442 Personal history of urinary calculi: Secondary | ICD-10-CM | POA: Insufficient documentation

## 2017-02-08 DIAGNOSIS — F419 Anxiety disorder, unspecified: Secondary | ICD-10-CM | POA: Diagnosis not present

## 2017-02-08 DIAGNOSIS — I25810 Atherosclerosis of coronary artery bypass graft(s) without angina pectoris: Secondary | ICD-10-CM | POA: Diagnosis not present

## 2017-02-08 DIAGNOSIS — Z91013 Allergy to seafood: Secondary | ICD-10-CM | POA: Diagnosis not present

## 2017-02-08 DIAGNOSIS — Z885 Allergy status to narcotic agent status: Secondary | ICD-10-CM | POA: Insufficient documentation

## 2017-02-08 DIAGNOSIS — Z7901 Long term (current) use of anticoagulants: Secondary | ICD-10-CM | POA: Insufficient documentation

## 2017-02-08 DIAGNOSIS — G4730 Sleep apnea, unspecified: Secondary | ICD-10-CM | POA: Insufficient documentation

## 2017-02-08 DIAGNOSIS — I480 Paroxysmal atrial fibrillation: Secondary | ICD-10-CM | POA: Insufficient documentation

## 2017-02-08 DIAGNOSIS — Z882 Allergy status to sulfonamides status: Secondary | ICD-10-CM | POA: Diagnosis not present

## 2017-02-08 DIAGNOSIS — Z9889 Other specified postprocedural states: Secondary | ICD-10-CM | POA: Insufficient documentation

## 2017-02-08 DIAGNOSIS — Z79899 Other long term (current) drug therapy: Secondary | ICD-10-CM | POA: Diagnosis not present

## 2017-02-08 DIAGNOSIS — I340 Nonrheumatic mitral (valve) insufficiency: Secondary | ICD-10-CM | POA: Insufficient documentation

## 2017-02-08 HISTORY — PX: TEE WITHOUT CARDIOVERSION: SHX5443

## 2017-02-08 HISTORY — PX: CARDIOVERSION: SHX1299

## 2017-02-08 SURGERY — CARDIOVERSION
Anesthesia: General

## 2017-02-08 MED ORDER — LACTATED RINGERS IV SOLN
INTRAVENOUS | Status: DC | PRN
  Administered 2017-02-08: 09:00:00 via INTRAVENOUS

## 2017-02-08 MED ORDER — EPHEDRINE SULFATE 50 MG/ML IJ SOLN
INTRAMUSCULAR | Status: DC | PRN
  Administered 2017-02-08: 10 mg via INTRAVENOUS
  Administered 2017-02-08: 5 mg via INTRAVENOUS

## 2017-02-08 MED ORDER — PROPOFOL 500 MG/50ML IV EMUL
INTRAVENOUS | Status: DC | PRN
  Administered 2017-02-08: 100 ug/kg/min via INTRAVENOUS

## 2017-02-08 MED ORDER — LIDOCAINE HCL (CARDIAC) 20 MG/ML IV SOLN
INTRAVENOUS | Status: DC | PRN
  Administered 2017-02-08: 100 mg via INTRATRACHEAL

## 2017-02-08 MED ORDER — SODIUM CHLORIDE 0.9 % IV SOLN: INTRAVENOUS | Status: DC

## 2017-02-08 MED ORDER — PHENYLEPHRINE HCL 10 MG/ML IJ SOLN
INTRAMUSCULAR | Status: DC | PRN
  Administered 2017-02-08 (×2): 120 ug via INTRAVENOUS
  Administered 2017-02-08: 40 ug via INTRAVENOUS
  Administered 2017-02-08: 120 ug via INTRAVENOUS

## 2017-02-08 NOTE — Anesthesia Preprocedure Evaluation (Signed)
Anesthesia Evaluation  Patient identified by MRN, date of birth, ID band Patient awake    Reviewed: Allergy & Precautions, NPO status , Patient's Chart, lab work & pertinent test results  Airway Mallampati: I  TM Distance: >3 FB     Dental   Pulmonary sleep apnea ,    Pulmonary exam normal        Cardiovascular hypertension, Normal cardiovascular exam+ Valvular Problems/Murmurs MR      Neuro/Psych    GI/Hepatic   Endo/Other    Renal/GU Renal InsufficiencyRenal disease     Musculoskeletal   Abdominal   Peds  Hematology   Anesthesia Other Findings   Reproductive/Obstetrics                             Anesthesia Physical Anesthesia Plan  ASA: III  Anesthesia Plan: General   Post-op Pain Management:    Induction: Intravenous  Airway Management Planned: Mask  Additional Equipment:   Intra-op Plan:   Post-operative Plan:   Informed Consent: I have reviewed the patients History and Physical, chart, labs and discussed the procedure including the risks, benefits and alternatives for the proposed anesthesia with the patient or authorized representative who has indicated his/her understanding and acceptance.     Plan Discussed with: CRNA and Surgeon  Anesthesia Plan Comments:         Anesthesia Quick Evaluation

## 2017-02-08 NOTE — H&P (View-Only) (Signed)
Pt came into the office to have EKG performed.  EKG shows AFib with RVR 121.  Pt denies CP or SOB, just complains of anxious feeling in chest since he had the flu a few weeks ago. The pt was previously taking his Metoprolol Succinate 25mg  just once a day but since he noticed his heart rate constantly changing he began taking the medication twice a day.  Per pt the second dose drops his BP to 80/50 range but he is asymptomatic. I discussed this pt with Dr Meda Coffee and pt to STOP Aspirin, START Eliquis 5mg  take one tablet by mouth twice a day and plan for TEE/DCCV on Friday.  Pt made aware of plan.  4 week supply of samples given for Eliquis and 30 day Free card also given. Pt scheduled for TEE/DCCV on 02/08/17 at 10:00 with Dr Meda Coffee, pre labs drawn today.

## 2017-02-08 NOTE — Transfer of Care (Signed)
Immediate Anesthesia Transfer of Care Note  Patient: Henry Page  Procedure(s) Performed: Procedure(s): CARDIOVERSION (N/A) TRANSESOPHAGEAL ECHOCARDIOGRAM (TEE) (N/A)  Patient Location: Endoscopy Unit  Anesthesia Type:MAC  Level of Consciousness: awake, alert  and oriented  Airway & Oxygen Therapy: Patient Spontanous Breathing and Patient connected to nasal cannula oxygen  Post-op Assessment: Report given to RN, Post -op Vital signs reviewed and stable and Patient moving all extremities X 4  Post vital signs: Reviewed and stable  Last Vitals:  Vitals:   02/08/17 0820  BP: (!) 150/98  Pulse: 84  Resp: 17    Last Pain:  Vitals:   02/08/17 0820  TempSrc: Oral         Complications: No apparent anesthesia complications

## 2017-02-08 NOTE — Progress Notes (Signed)
  Echocardiogram Echocardiogram Transesophageal has been performed.  Bobbye Charleston 02/08/2017, 10:21 AM

## 2017-02-08 NOTE — Anesthesia Postprocedure Evaluation (Signed)
Anesthesia Post Note  Patient: Henry Page  Procedure(s) Performed: Procedure(s) (LRB): CARDIOVERSION (N/A) TRANSESOPHAGEAL ECHOCARDIOGRAM (TEE) (N/A)  Patient location during evaluation: PACU Anesthesia Type: General Level of consciousness: awake and alert Pain management: pain level controlled Vital Signs Assessment: post-procedure vital signs reviewed and stable Respiratory status: spontaneous breathing, nonlabored ventilation, respiratory function stable and patient connected to nasal cannula oxygen Cardiovascular status: blood pressure returned to baseline and stable Postop Assessment: no signs of nausea or vomiting Anesthetic complications: no       Last Vitals:  Vitals:   02/08/17 1040 02/08/17 1050  BP: 91/65 101/66  Pulse: 68 (!) 56  Resp: 12 14  Temp:      Last Pain:  Vitals:   02/08/17 1029  TempSrc: Oral                 Kalmen Lollar DAVID

## 2017-02-08 NOTE — H&P (Signed)
Henry Page is an 71 y.o. male.   Chief Complaint: atrial fibrillation HPI: Pt came into the office on 02/04/2017 to have EKG performed.  EKG shows AFib with RVR 121.  Pt denies CP or SOB, just complains of anxious feeling in chest since he had the flu a few weeks ago. The pt was previously taking his Metoprolol Succinate 25mg  just once a day but since he noticed his heart rate constantly changing he began taking the medication twice a day.  Per pt the second dose drops his BP to 80/50 range but he is asymptomatic. I discussed this pt with Dr Meda Coffee and pt to STOP Aspirin, START Eliquis 5mg  take one tablet by mouth twice a day and plan for TEE/DCCV on Friday.  Pt made aware of plan.  4 week supply of samples given for Eliquis and 30 day Free card also given. Pt scheduled for TEE/DCCV on 02/08/17 at 10:00 with Dr Meda Coffee, pre labs drawn today.   Presented today for DCCV, he has taken all of his meds including Eliquis.   Past Medical History:  Diagnosis Date  . Acute combined systolic and diastolic congestive heart failure ([place]) 07/06/2013  . Anxiety   . Atrial fibrillation ([place]) 07/06/2013  . CKD (chronic kidney disease), stage III   . Essential hypertension, benign 07/06/2013  . Heart murmur   . Kidney stones    some passed spontaneous, one retrieved by cystoscope   . Mitral regurgitation 07/06/2013  . Mitral valve prolapse 07/06/2013  . 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 S/P Maze operation for atrial fibrillation 07/22/2013   Complete bilateral atrial lesion set using bipolar radiofrequency and cryothermy ablation with clipping of LA appendage   . S/P mitral valve repair 07/22/2013   Complex valvuloplasty including triangular resection of posterior leaflet with artificial Gore-tex neocord placement x2 and 69mm Sorin Memo 3D ring annuloplasty  . Shortness of breath   . Sleep apnea     Past Surgical History:  Procedure  Laterality Date  . CORONARY ARTERY BYPASS GRAFT N/A 07/22/2013   Procedure: CORONARY ARTERY BYPASS GRAFTING (CABG);  Surgeon: Rexene Alberts, MD;  Location: [place];  Service: Open Heart Surgery;  Laterality: N/A;  . CYSTOSCOPY    . INTRAOPERATIVE TRANSESOPHAGEAL ECHOCARDIOGRAM N/A 07/22/2013   Procedure: INTRAOPERATIVE TRANSESOPHAGEAL ECHOCARDIOGRAM;  Surgeon: Rexene Alberts, MD;  Location: [place];  Service: Open Heart Surgery;  Laterality: N/A;  . LEFT AND RIGHT HEART CATHETERIZATION WITH CORONARY ANGIOGRAM  07/07/2013   Procedure: LEFT AND RIGHT HEART CATHETERIZATION WITH CORONARY ANGIOGRAM;  Surgeon: Thayer Headings, MD;  Location: [facility] CATH LAB;  Service: Cardiovascular;;  . MAZE N/A 07/22/2013   Procedure: MAZE;  Surgeon: Rexene Alberts, MD;  Location: [place];  Service: Open Heart Surgery;  Laterality: N/A;  . MITRAL VALVE REPAIR N/A 07/22/2013   Procedure: MITRAL VALVE REPAIR (MVR);  Surgeon: Rexene Alberts, MD;  Location: [place];  Service: Open Heart Surgery;  Laterality: N/A;  . PATENT FORAMEN OVALE CLOSURE N/A 07/22/2013   Procedure: PATENT FORAMEN OVALE CLOSURE;  Surgeon: Rexene Alberts, MD;  Location: [place];  Service: Open Heart Surgery;  Laterality: N/A;  . TEE WITHOUT CARDIOVERSION N/A 07/08/2013   Procedure: TRANSESOPHAGEAL ECHOCARDIOGRAM (TEE);  Surgeon: Larey Dresser, MD;  Location: [place];  Service: Cardiovascular;  Laterality: N/A;  . TONSILLECTOMY      Family  History  Problem Relation Age of Onset  . Heart attack Father    Social History:  reports that he has never smoked. He does not have any smokeless tobacco history on file. He reports that he drinks about 1.0 oz of alcohol per week . He reports that he does not use drugs.  Allergies:  Allergies  Allergen Reactions  . Shellfish Allergy Other (See Comments)    flulike symptoms   . Sulfa Antibiotics Other (See Comments)    "very sick"  . Meperidine And Related Itching    Medications Prior to Admission   Medication Sig Dispense Refill  . apixaban (ELIQUIS) 5 MG TABS tablet Take 1 tablet (5 mg total) by mouth 2 (two) times daily. 60 tablet 0  . atorvastatin (LIPITOR) 40 MG tablet TAKE 1 TABLET BY MOUTH EVERY DAY AT 6:00PM 30 tablet 7  . benazepril (LOTENSIN) 10 MG tablet TAKE 1 TABLET BY MOUTH EVERY DAY 30 tablet 7  . hydrochlorothiazide (HYDRODIURIL) 25 MG tablet TAKE 1 TABLET BY MOUTH EVERY DAY 30 tablet 7  . metoprolol tartrate (LOPRESSOR) 25 MG tablet TAKE 1 TABLET BY MOUTH TWICE DAILY 60 tablet 7  . amoxicillin (AMOXIL) 500 MG capsule Take 2,000 mg by mouth as directed. 1 hour prior to dental appointments  99    No results found for this or any previous visit (from the past 48 hour(s)). No results found.  ROS  Blood pressure (!) 150/98, pulse 84, resp. rate 17, SpO2 98 %. Physical Exam   Assessment/Plan Paroxysmal atrial fibrillation sec to flu, started on Eliquis, splan for a TEE/ DCCV today.   Ena Dawley, MD 02/08/2017, 9:57 AM

## 2017-02-08 NOTE — Discharge Instructions (Signed)
Electrical Cardioversion, Care After This sheet gives you information about how to care for yourself after your procedure. Your health care provider may also give you more specific instructions. If you have problems or questions, contact your health care provider. What can I expect after the procedure? After the procedure, it is common to have:  Some redness on the skin where the shocks were given. Follow these instructions at home:  Do not drive for 24 hours if you were given a medicine to help you relax (sedative).  Take over-the-counter and prescription medicines only as told by your health care provider.  Ask your health care provider how to check your pulse. Check it often.  Rest for 48 hours after the procedure or as told by your health care provider.  Avoid or limit your caffeine use as told by your health care provider. Contact a health care provider if:  You feel like your heart is beating too quickly or your pulse is not regular.  You have a serious muscle cramp that does not go away. Get help right away if:  You have discomfort in your chest.  You are dizzy or you feel faint.  You have trouble breathing or you are short of breath.  Your speech is slurred.  You have trouble moving an arm or leg on one side of your body.  Your fingers or toes turn cold or blue. This information is not intended to replace advice given to you by your health care provider. Make sure you discuss any questions you have with your health care provider. Document Released: 09/30/2013 Document Revised: 07/13/2016 Document Reviewed: 06/15/2016 Elsevier Interactive Patient Education  2017 [place], Care After These instructions provide you with information about caring for yourself after your procedure. Your health care provider may also give you more specific instructions. Your treatment has been planned according to current medical practices, but problems  sometimes occur. Call your health care provider if you have any problems or questions after your procedure. What can I expect after the procedure? After your procedure, it is common to:  Feel sleepy for several hours.  Feel clumsy and have poor balance for several hours.  Feel forgetful about what happened after the procedure.  Have poor judgment for several hours.  Feel nauseous or vomit.  Have a sore throat if you had a breathing tube during the procedure. Follow these instructions at home: For at least 24 hours after the procedure:   Do not:  Participate in activities in which you could fall or become injured.  Drive.  Use heavy machinery.  Drink alcohol.  Take sleeping pills or medicines that cause drowsiness.  Make important decisions or sign legal documents.  Take care of children on your own.  Rest. Eating and drinking  Follow the diet that is recommended by your health care provider.  If you vomit, drink water, juice, or soup when you can drink without vomiting.  Make sure you have little or no nausea before eating solid foods. General instructions  Have a responsible adult stay with you until you are awake and alert.  Take over-the-counter and prescription medicines only as told by your health care provider.  If you smoke, do not smoke without supervision.  Keep all follow-up visits as told by your health care provider. This is important. Contact a health care provider if:  You keep feeling nauseous or you keep vomiting.  You feel light-headed.  You develop a rash.  You have a fever. Get help right away if:  You have trouble breathing. This information is not intended to replace advice given to you by your health care provider. Make sure you discuss any questions you have with your health care provider. Document Released: 04/01/2016 Document Revised: 08/01/2016 Document Reviewed: 04/01/2016 Elsevier Interactive Patient Education  2017  [employer].

## 2017-02-08 NOTE — Interval H&P Note (Signed)
History and Physical Interval Note:  02/08/2017 7:59 AM  Henry Page  has presented today for surgery, with the diagnosis of afib  The various methods of treatment have been discussed with the patient and family. After consideration of risks, benefits and other options for treatment, the patient has consented to  Procedure(s): CARDIOVERSION (N/A) TRANSESOPHAGEAL ECHOCARDIOGRAM (TEE) (N/A) as a surgical intervention .  The patient's history has been reviewed, patient examined, no change in status, stable for surgery.  I have reviewed the patient's chart and labs.  Questions were answered to the patient's satisfaction.     Ena Dawley

## 2017-02-08 NOTE — CV Procedure (Signed)
     Transesophageal Echocardiogram Note  Henry Page [id number] 10/25/1947  Procedure: Transesophageal Echocardiogram Indications: atrial fibrillation  Procedure Details Consent: Obtained Time Out: Verified patient identification, verified procedure, site/side was marked, verified correct patient position, special equipment/implants available, Radiology Safety Procedures followed,  medications/allergies/relevent history reviewed, required imaging and test results available.  Performed  Medications: IV propofol administered by anesthesia staff/   Left Ventrical:  LVEF 50-55%  Mitral Valve: s/p repair, normal transmitral gradients, mild to moderate MR  Aortic Valve: normal  Tricuspid Valve: mild TR, RVSP 22 mmHg  Pulmonic Valve: normal  Left Atrium/ Left atrial appendage: s/p left atrial clipping, no residual flow  Atrial septum: no PFO/ASD  Aorta: mild non-mobile plaque   Complications: No apparent complications Patient did tolerate procedure well.  Ena Dawley, MD, [facility] 02/08/2017, 10:38 AM   Cardioversion Note  Henry Page [id number] 10/25/1947  Procedure: DC Cardioversion Indications: atrial fibrillation  Procedure Details Consent: Obtained Time Out: Verified patient identification, verified procedure, site/side was marked, verified correct patient position, special equipment/implants available, Radiology Safety Procedures followed,  medications/allergies/relevent history reviewed, required imaging and test results available.  Performed  The patient has been on adequate anticoagulation.  The patient received IV propofol administered by anesthesia staff for sedation.  Synchronous cardioversion was performed at 120 joules.  The cardioversion was successful.   Complications: No apparent complications Patient did tolerate procedure well.   Ena Dawley, MD, [facility] 02/08/2017, 10:38 AM

## 2017-02-10 ENCOUNTER — Encounter (HOSPITAL_COMMUNITY): Payer: Self-pay | Admitting: Cardiology

## 2017-03-21 ENCOUNTER — Encounter: Payer: Self-pay | Admitting: Cardiovascular Disease

## 2017-04-04 ENCOUNTER — Ambulatory Visit (INDEPENDENT_AMBULATORY_CARE_PROVIDER_SITE_OTHER): Payer: BLUE CROSS/BLUE SHIELD | Admitting: Cardiovascular Disease

## 2017-04-04 ENCOUNTER — Encounter: Payer: Self-pay | Admitting: Cardiovascular Disease

## 2017-04-04 VITALS — BP 162/80 | HR 54 | Ht 70.0 in | Wt 182.0 lb

## 2017-04-04 DIAGNOSIS — I480 Paroxysmal atrial fibrillation: Secondary | ICD-10-CM

## 2017-04-04 DIAGNOSIS — I10 Essential (primary) hypertension: Secondary | ICD-10-CM

## 2017-04-04 DIAGNOSIS — E785 Hyperlipidemia, unspecified: Secondary | ICD-10-CM

## 2017-04-04 MED ORDER — ASPIRIN EC 81 MG PO TBEC: 81.0000 mg | DELAYED_RELEASE_TABLET | Freq: Every day | ORAL | Status: DC

## 2017-04-04 NOTE — Patient Instructions (Addendum)
Medication Instructions:  Your physician has recommended you make the following change in your medication:  1. STOP Eliquis 2. START Aspirin 81mg  take one tablet by mouth daily  Labwork: Your physician recommends that you return for a FASTING LIPID, CMP and CBC in 1 YEAR--nothing to eat or drink after midnight, lab opens at 7:30 AM  Testing/Procedures: No new orders.   Follow-Up: Your physician wants you to follow-up in: 1 YEAR with Dr Burt Knack.  You will receive a reminder letter in the mail two months in advance. If you don't receive a letter, please call our office to schedule the follow-up appointment.    Any Other Special Instructions Will Be Listed Below (If Applicable).     If you need a refill on your cardiac medications before your next appointment, please call your pharmacy.

## 2017-04-04 NOTE — Progress Notes (Signed)
Cardiology Office Note Date:  04/04/2017   ID:  Henry Page, DOB 10/25/1947, MRN [id number]  PCP:  Glenda Chroman, MD  Cardiologist:  Sherren Mocha, MD    No chief complaint on file.    History of Present Illness: Henry Page is a 71 y.o. male who presents for follow-up of paroxysmal atrial fibrillation. He developed AF with RVR in February associated with influenza. TEE/cardioversion was arranged and this was successful. The patient monitors his heart rate and BP every day and has been in sinus rhythm ever since cardioversion with HR running the 50's.   He has a history of CAD, mitral regurgitation, and atrial fibrillation. The patient underwent 4 vessel CABG, mitral valve repair, and cryo- Cox-Maze procedure in 2014. His postoperative echo showed normal mitral valve function after mitral valve repair. He has maintained sinus rhythm since undergoing a maze procedure until the recent episode of atrial fibrillation.   Here with his wife today. No complaints. Today, he denies symptoms of palpitations, chest pain, shortness of breath, orthopnea, PND, lower extremity edema, dizziness, or syncope.   Past Medical History:  Diagnosis Date  . Acute combined systolic and diastolic congestive heart failure ([place]) 07/06/2013  . Anxiety   . Atrial fibrillation ([place]) 07/06/2013  . CKD (chronic kidney disease), stage III   . Essential hypertension, benign 07/06/2013  . Heart murmur   . Kidney stones    some passed spontaneous, one retrieved by cystoscope   . Mitral regurgitation 07/06/2013  . Mitral valve prolapse 07/06/2013  . 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 S/P Maze operation for atrial fibrillation 07/22/2013   Complete bilateral atrial lesion set using bipolar radiofrequency and cryothermy ablation with clipping of LA appendage   . S/P mitral valve repair 07/22/2013   Complex valvuloplasty including triangular  resection of posterior leaflet with artificial Gore-tex neocord placement x2 and 29mm Sorin Memo 3D ring annuloplasty  . Shortness of breath   . Sleep apnea     Past Surgical History:  Procedure Laterality Date  . CARDIOVERSION N/A 02/08/2017   Procedure: CARDIOVERSION;  Surgeon: Dorothy Spark, MD;  Location: [place];  Service: Cardiovascular;  Laterality: N/A;  . CORONARY ARTERY BYPASS GRAFT N/A 07/22/2013   Procedure: CORONARY ARTERY BYPASS GRAFTING (CABG);  Surgeon: Rexene Alberts, MD;  Location: [place];  Service: Open Heart Surgery;  Laterality: N/A;  . CYSTOSCOPY    . INTRAOPERATIVE TRANSESOPHAGEAL ECHOCARDIOGRAM N/A 07/22/2013   Procedure: INTRAOPERATIVE TRANSESOPHAGEAL ECHOCARDIOGRAM;  Surgeon: Rexene Alberts, MD;  Location: [place];  Service: Open Heart Surgery;  Laterality: N/A;  . LEFT AND RIGHT HEART CATHETERIZATION WITH CORONARY ANGIOGRAM  07/07/2013   Procedure: LEFT AND RIGHT HEART CATHETERIZATION WITH CORONARY ANGIOGRAM;  Surgeon: Thayer Headings, MD;  Location: [facility] CATH LAB;  Service: Cardiovascular;;  . MAZE N/A 07/22/2013   Procedure: MAZE;  Surgeon: Rexene Alberts, MD;  Location: [place];  Service: Open Heart Surgery;  Laterality: N/A;  . MITRAL VALVE REPAIR N/A 07/22/2013   Procedure: MITRAL VALVE REPAIR (MVR);  Surgeon: Rexene Alberts, MD;  Location: [place];  Service: Open Heart Surgery;  Laterality: N/A;  . PATENT FORAMEN OVALE CLOSURE N/A 07/22/2013   Procedure: PATENT FORAMEN OVALE CLOSURE;  Surgeon: Rexene Alberts, MD;  Location: [place];  Service: Open Heart Surgery;  Laterality: N/A;  . TEE WITHOUT CARDIOVERSION N/A 07/08/2013  Procedure: TRANSESOPHAGEAL ECHOCARDIOGRAM (TEE);  Surgeon: Larey Dresser, MD;  Location: [place];  Service: Cardiovascular;  Laterality: N/A;  . TEE WITHOUT CARDIOVERSION N/A 02/08/2017   Procedure: TRANSESOPHAGEAL ECHOCARDIOGRAM (TEE);  Surgeon: Dorothy Spark, MD;  Location[facility] ENDOSCOPY;  Service: Cardiovascular;  Laterality: N/A;    . TONSILLECTOMY      Current Outpatient Prescriptions  Medication Sig Dispense Refill  . amoxicillin (AMOXIL) 500 MG capsule Take 2,000 mg by mouth as directed. 1 hour prior to dental appointments  99  . apixaban (ELIQUIS) 5 MG TABS tablet Take 1 tablet (5 mg total) by mouth 2 (two) times daily. 60 tablet 0  . atorvastatin (LIPITOR) 40 MG tablet TAKE 1 TABLET BY MOUTH EVERY DAY AT 6:00PM 30 tablet 7  . benazepril (LOTENSIN) 10 MG tablet TAKE 1 TABLET BY MOUTH EVERY DAY 30 tablet 7  . hydrochlorothiazide (HYDRODIURIL) 25 MG tablet TAKE 1 TABLET BY MOUTH EVERY DAY 30 tablet 7  . metoprolol tartrate (LOPRESSOR) 25 MG tablet TAKE 1 TABLET BY MOUTH TWICE DAILY 60 tablet 7   No current facility-administered medications for this visit.     Allergies:   Shellfish allergy; Sulfa antibiotics; and Meperidine and related   Social History:  The patient  reports that he has never smoked. He has never used smokeless tobacco. He reports that he drinks about 1.0 oz of alcohol per week . He reports that he does not use drugs.   Family History:  The patient's  family history includes Heart attack in his father.    ROS:  Please see the history of present illness.   All other systems are reviewed and negative.    PHYSICAL EXAM: VS:  BP (!) 162/80   Pulse (!) 54   Ht 5\' 10"  (1.778 m)   Wt 182 lb (82.6 kg)   SpO2 95%   BMI 26.11 kg/m  , BMI Body mass index is 26.11 kg/m. GEN: Well nourished, well developed, in no acute distress  HEENT: normal  Neck: no JVD, no masses. No carotid bruits Cardiac: bradycardic and regular without murmur or gallop                Respiratory:  clear to auscultation bilaterally, normal work of breathing GI: soft, nontender, nondistended, + BS MS: no deformity or atrophy  Ext: no pretibial edema, pedal pulses 2+= bilaterally Skin: warm and dry, no rash Neuro:  Strength and sensation are intact Psych: euthymic mood, full affect  EKG:  EKG is not ordered  today.  Recent Labs: 06/05/2016: ALT 23 02/04/2017: BUN 26; Creatinine, Ser 1.38; Platelets 237; Potassium 4.5; Sodium 140   Lipid Panel     Component Value Date/Time   CHOL 122 (L) 06/05/2016 1048   TRIG 248 (H) 06/05/2016 1048   HDL 36 (L) 06/05/2016 1048   CHOLHDL 3.4 06/05/2016 1048   VLDL 50 (H) 06/05/2016 1048   LDLCALC 36 06/05/2016 1048   LDLDIRECT 42.0 04/26/2015 0950      Wt Readings from Last 3 Encounters:  04/04/17 182 lb (82.6 kg)  02/04/17 180 lb 8 oz (81.9 kg)  06/07/16 182 lb 12.8 oz (82.9 kg)     Cardiac Studies Reviewed: TEE: Study Conclusions  - Left ventricle: There was mild concentric hypertrophy. Systolic   function was normal. The estimated ejection fraction was in the   range of 50% to 55%. Diffuse hypokinesis. - Aortic valve: Structurally normal valve. Trileaflet; normal   thickness leaflets. No evidence of vegetation. - Aorta:  There was mild non-mobile atheroma. - Ascending aorta: The ascending aorta was upper noral size   measuring 40 mm. - Mitral valve: Leaflet separation was normal. Mean gradient (D): 2   mm Hg. - Left atrium: The atrium was dilated. No evidence of thrombus in   the atrial cavity or appendage. No evidence of thrombus. The   appendage was ligated with no residual flow. - Right ventricle: The cavity size was normal. Wall thickness was   normal. - Right atrium: No evidence of thrombus in the atrial cavity or   appendage. - Pulmonary arteries: Systolic pressure was within the normal   range.  Impressions:  - The appendage was ligated with no residual flow. No thrombus was   seen in the left atrium.   TEE was followed by a successful cardioversion.   ASSESSMENT AND PLAN: 1.  Paroxysmal atrial fibrillation: CHADS-Vasc = 3 (age, HTN, CAD). However, isolated episode associated with the flu. He has had LAA clipping with no flow in the LAA seen on TEE. He monitors pulse every day and will notify us if recurrent AF. For now  will stop Eliquis and resume ASA 81 mg daily.  2. CAD, native vessel: no angina. Continue same Rx.  3. HTN, white coat: home BP's remain in range. Continue metoprolol, HCTZ, and benazepril.   4. CKD 3: labs stable recent creatinine 1.38 mg/dL  5. Hyperlipidemia: treated with atorvastatin. Last LDL 36 mg/dL.  Current medicines are reviewed with the patient today.  The patient does not have concerns regarding medicines.  Labs/ tests ordered today include:  No orders of the defined types were placed in this encounter.   Disposition:   FU one year  Signed, Sherren Mocha, MD  04/04/2017 11:36 AM    [facility] [address], [place], [place]  [address] Phone: [phone]; Fax: [phone]

## 2017-04-13 ENCOUNTER — Telehealth: Payer: Self-pay | Admitting: Internal Medicine

## 2017-04-13 NOTE — Telephone Encounter (Signed)
Cardiology After Hours Note  Pt of Dr Burt Knack with a hx of PAF who monitors his pulse daily.  Notes that this evening he checked his pulse and noted it to be in the 100-110 range, which usually indicates he is in AF. As instructed by Dr. Burt Knack, he took an additional Metoprolol and would like additional instructions.  He has had a recent sinus infection, and other than recovering from that, he is asymptomatic and denies CP, SOB, palpitations, or orthopnea.    I advised that he monitor for progression of his symptoms or worsening heart rate.  Should either occur, recommend he be seen in the ED.  He is currently on ASA for anticoagulation and has a hx of LAA ligation.    He and his wife voiced understanding and willingness to comply  CC Dr Burt Knack as Clemencia Course

## 2017-04-15 ENCOUNTER — Telehealth: Payer: Self-pay | Admitting: Cardiovascular Disease

## 2017-04-15 MED ORDER — APIXABAN 5 MG PO TABS: 5.0000 mg | ORAL_TABLET | Freq: Two times a day (BID) | ORAL | 2 refills | Status: DC

## 2017-04-15 NOTE — Telephone Encounter (Signed)
New message    Pt wife is calling about pt pulse rate over the weekend. She is asking for a call from RN.

## 2017-04-15 NOTE — Telephone Encounter (Signed)
Discussed with Dr Burt Knack and he would like the pt to STOP Aspirin, RESTART Eliquis, arrange for DCCV after 3 weeks of Eliquis if the pt remains in AFib.  If the pt continues to have recurrent AFib in the future then he may require an antiarrhythmic. The pt should take Metoprolol Tartrate as prescribed.   I contacted the pt and made him aware of these instructions. I have scheduled the pt to see [name] PA-C on 05/08/17 to assess if DCCV is needed. The pt will contact the office if his heart becomes elevated (greater than 100) or he develops CP, SOB or dizziness. Pt agreed with plan.

## 2017-04-15 NOTE — Telephone Encounter (Signed)
I spoke with the pt and he worked in the yard on Saturday and since that time he has noticed fluctuations in his pulse.  He uses a pulse ox at home and his pulse stays around the 50-60 range and then jumps to the 80s and then will settle back in the 50-60 range.  The pt feels like he has some anxiety which is contributing to fluctuations. The pt also has had a sinus infection (taking plain Zyrtec) and diarrhea. Saturday the pt drank fluids while outside but maybe not enough to remain well hydrated. The pt did resume his evening dose of Metoprolol Tartrate this past Saturday. BP today 111/82.  The pt has purchased an Psychologist, forensic and it will arrive tomorrow. I will discuss this pt with Dr Burt Knack.

## 2017-04-16 ENCOUNTER — Telehealth: Payer: Self-pay | Admitting: Cardiovascular Disease

## 2017-04-16 NOTE — Telephone Encounter (Signed)
I spoke with the pt's wife and made her aware of the reason for waiting 3 weeks to do EKG.  The pt will be getting an [employer] monitor in the mail today and they plan to send a strip through My Chart or by fax for review.  She states the pt is picking up his Eliquis Rx today and I advised pt needs to stop ASA when he starts Eliquis.  She also said the pt's pulse was 47 last night and when he got up he was lightheaded.  The pt normally does not take his evening dose of Metoprolol Tartrate but he has done this since Saturday night.  I instructed her to have the pt cut his tablet in half tonight to see how his pulse responds. She agreed with plan.

## 2017-04-16 NOTE — Telephone Encounter (Signed)
New Message     Pt wife would like you to move EKG to an earlier date if possible

## 2017-04-16 NOTE — Telephone Encounter (Signed)
Agree with plan 

## 2017-04-19 ENCOUNTER — Telehealth: Payer: Self-pay | Admitting: Cardiovascular Disease

## 2017-04-19 NOTE — Telephone Encounter (Signed)
New message     Pt bought the machine that hooks to cellphone that Dr cooper suggested they need a email address to send transmissions too. Please call with that information, thank you

## 2017-04-19 NOTE — Telephone Encounter (Signed)
I spoke with Henry Page and made her aware that there is not an office email address to forward the pt's rhythm strip too.  I advised her to use my chart and send this as an attachment.  She will try to do this and if she cannot then she will contact the help desk for assistance.

## 2017-05-08 ENCOUNTER — Telehealth: Payer: Self-pay | Admitting: *Deleted

## 2017-05-08 ENCOUNTER — Encounter: Payer: Self-pay | Admitting: Physician Assistant

## 2017-05-08 ENCOUNTER — Ambulatory Visit (INDEPENDENT_AMBULATORY_CARE_PROVIDER_SITE_OTHER): Payer: BLUE CROSS/BLUE SHIELD | Admitting: Physician Assistant

## 2017-05-08 VITALS — BP 160/80 | HR 56 | Ht 70.0 in | Wt 183.8 lb

## 2017-05-08 DIAGNOSIS — I480 Paroxysmal atrial fibrillation: Secondary | ICD-10-CM | POA: Diagnosis not present

## 2017-05-08 DIAGNOSIS — Z9889 Other specified postprocedural states: Secondary | ICD-10-CM | POA: Diagnosis not present

## 2017-05-08 DIAGNOSIS — I2510 Atherosclerotic heart disease of native coronary artery without angina pectoris: Secondary | ICD-10-CM

## 2017-05-08 DIAGNOSIS — I10 Essential (primary) hypertension: Secondary | ICD-10-CM | POA: Diagnosis not present

## 2017-05-08 DIAGNOSIS — E785 Hyperlipidemia, unspecified: Secondary | ICD-10-CM | POA: Insufficient documentation

## 2017-05-08 LAB — CBC
HEMATOCRIT: 40.4 % (ref 37.5–51.0)
Hemoglobin: 13.9 g/dL (ref 13.0–17.7)
MCH: 32.7 pg (ref 26.6–33.0)
MCHC: 34.4 g/dL (ref 31.5–35.7)
MCV: 95 fL (ref 79–97)
Platelets: 196 10*3/uL (ref 150–379)
RBC: 4.25 x10E6/uL (ref 4.14–5.80)
RDW: 14.1 % (ref 12.3–15.4)
WBC: 6.6 10*3/uL (ref 3.4–10.8)

## 2017-05-08 LAB — BASIC METABOLIC PANEL
BUN / CREAT RATIO: 17 (ref 10–24)
BUN: 24 mg/dL (ref 8–27)
CO2: 24 mmol/L (ref 18–29)
CREATININE: 1.39 mg/dL — AB (ref 0.76–1.27)
Calcium: 9.6 mg/dL (ref 8.6–10.2)
Chloride: 98 mmol/L (ref 96–106)
GFR, EST AFRICAN AMERICAN: 59 mL/min/{1.73_m2} — AB (ref >59)
GFR, EST NON AFRICAN AMERICAN: 51 mL/min/{1.73_m2} — AB (ref >59)
Glucose: 93 mg/dL (ref 65–99)
Potassium: 4.6 mmol/L (ref 3.5–5.2)
Sodium: 139 mmol/L (ref 134–144)

## 2017-05-08 NOTE — Telephone Encounter (Signed)
Lmtcb to go over lab results. Lab results have been sent to [facility] as well.

## 2017-05-08 NOTE — Patient Instructions (Addendum)
Medication Instructions:  Your physician recommends that you continue on your current medications as directed. Please refer to the Current Medication list given to you today.   Labwork: TODAY BMET, CBC  Testing/Procedures: NONE ORDERED   Follow-Up: 08/23/17 @ 9:20 WITH DR. Burt Knack  Any Other Special Instructions Will Be Listed Below (If Applicable).     If you need a refill on your cardiac medications before your next appointment, please call your pharmacy.

## 2017-05-08 NOTE — Progress Notes (Signed)
Cardiology Office Note:    Date:  05/08/2017   ID:  Henry Page, DOB 10/25/1947, MRN [id number]  PCP:  Glenda Chroman, MD  Cardiologist:  Dr. Sherren Mocha    Referring MD: Glenda Chroman, MD   Chief Complaint  Patient presents with  . Atrial Fibrillation    follow up    History of Present Illness:    Henry Page is a 71 y.o. male with a hx of PAF, CAD and mitral regurgitation s/p CABG, MV repair and cryo Cox-maze procedure in 2014.  He developed AF with RVR in 2/18 in the setting of influenza and underwent TEE guided DCCV.  He was last seen by Dr. Sherren Mocha in 4/18.  He was back in normal sinus rhythm.  As AF was associated with influenza and his LA appendage was previously clipped (no flow in LAA on TEE), Eliquis was DC'd.    The patient called in with recurrent palpitations c/w AFib.  Therefore, anticoagulation was resumed with Eliquis.  He returns for Cardiology follow up with an eye towards DCCV if he remains in atrial fibrillation.  He is here with his wife.  He denies chest pain, shortness of breath, syncope, orthopnea, PND or significant pedal edema.  He has a Chiropodist.  I reviewed the EKG strips on his phone.  He had one episode of atrial fibrillation in late April.    Prior CV studies:   The following studies were reviewed today:  TEE 02/08/17 Mild concentric LVH, EF 50-55, diffuse HK, nonmobile atheroma on the aorta, ascending aorta upper normal size (40 mm), LAE-LAA ligated with no residual flow  Echo 9/14 Mild concentric LVH, EF 50-55, normal wall motion, grade 2 diastolic dysfunction, ascending aorta 40 mm, mitral valve repair okay with trivial MR, mild LAE, trivial TR, PASP 32  Carotid US 7/14 No ICA stenosis  LHC 7/14 LM distal 40 LAD proximal 40-60, mid 60-70 LCx okay with OM 50 RI proximal 40-50 RCA mid 70-80  Past Medical History:  Diagnosis Date  . Acute combined systolic and diastolic congestive heart failure ([place]) 07/06/2013  . Anxiety   .  Atrial fibrillation ([place]) 07/06/2013  . CKD (chronic kidney disease), stage III   . Essential hypertension, benign 07/06/2013  . Heart murmur   . Kidney stones    some passed spontaneous, one retrieved by cystoscope   . Mitral regurgitation 07/06/2013  . Mitral valve prolapse 07/06/2013  . 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 S/P Maze operation for atrial fibrillation 07/22/2013   Complete bilateral atrial lesion set using bipolar radiofrequency and cryothermy ablation with clipping of LA appendage   . S/P mitral valve repair 07/22/2013   Complex valvuloplasty including triangular resection of posterior leaflet with artificial Gore-tex neocord placement x2 and 70mm Sorin Memo 3D ring annuloplasty  . Shortness of breath   . Sleep apnea     Past Surgical History:  Procedure Laterality Date  . CARDIOVERSION N/A 02/08/2017   Procedure: CARDIOVERSION;  Surgeon: Dorothy Spark, MD;  Location: [facility];  Service: Cardiovascular;  Laterality: N/A;  . CORONARY ARTERY BYPASS GRAFT N/A 07/22/2013   Procedure: CORONARY ARTERY BYPASS GRAFTING (CABG);  Surgeon: Rexene Alberts, MD;  Location: [facility];  Service: Open Heart Surgery;  Laterality: N/A;  . CYSTOSCOPY    . INTRAOPERATIVE TRANSESOPHAGEAL ECHOCARDIOGRAM N/A 07/22/2013   Procedure: INTRAOPERATIVE TRANSESOPHAGEAL  ECHOCARDIOGRAM;  Surgeon: Rexene Alberts, MD;  Location: [place];  Service: Open Heart Surgery;  Laterality: N/A;  . LEFT AND RIGHT HEART CATHETERIZATION WITH CORONARY ANGIOGRAM  07/07/2013   Procedure: LEFT AND RIGHT HEART CATHETERIZATION WITH CORONARY ANGIOGRAM;  Surgeon: Thayer Headings, MD;  Location: [facility] CATH LAB;  Service: Cardiovascular;;  . MAZE N/A 07/22/2013   Procedure: MAZE;  Surgeon: Rexene Alberts, MD;  Location: [facility];  Service: Open Heart Surgery;  Laterality: N/A;  . MITRAL VALVE REPAIR N/A 07/22/2013   Procedure: MITRAL VALVE REPAIR (MVR);   Surgeon: Rexene Alberts, MD;  Location: [place];  Service: Open Heart Surgery;  Laterality: N/A;  . PATENT FORAMEN OVALE CLOSURE N/A 07/22/2013   Procedure: PATENT FORAMEN OVALE CLOSURE;  Surgeon: Rexene Alberts, MD;  Location: [place];  Service: Open Heart Surgery;  Laterality: N/A;  . TEE WITHOUT CARDIOVERSION N/A 07/08/2013   Procedure: TRANSESOPHAGEAL ECHOCARDIOGRAM (TEE);  Surgeon: Larey Dresser, MD;  Location: [place];  Service: Cardiovascular;  Laterality: N/A;  . TEE WITHOUT CARDIOVERSION N/A 02/08/2017   Procedure: TRANSESOPHAGEAL ECHOCARDIOGRAM (TEE);  Surgeon: Dorothy Spark, MD;  Location: [facility] ENDOSCOPY;  Service: Cardiovascular;  Laterality: N/A;  . TONSILLECTOMY      Current Medications: Current Meds  Medication Sig  . amoxicillin (AMOXIL) 500 MG capsule Take 2,000 mg by mouth as directed. 1 hour prior to dental appointments  . apixaban (ELIQUIS) 5 MG TABS tablet Take 1 tablet (5 mg total) by mouth 2 (two) times daily.  Marland Kitchen atorvastatin (LIPITOR) 40 MG tablet TAKE 1 TABLET BY MOUTH EVERY DAY AT 6:00PM  . benazepril (LOTENSIN) 10 MG tablet TAKE 1 TABLET BY MOUTH EVERY DAY  . hydrochlorothiazide (HYDRODIURIL) 25 MG tablet TAKE 1 TABLET BY MOUTH EVERY DAY  . metoprolol tartrate (LOPRESSOR) 25 MG tablet TAKE 1 TABLET BY MOUTH TWICE DAILY     Allergies:   Shellfish allergy; Sulfa antibiotics; and Meperidine and related   Social History   Social History  . Marital status: Married    Spouse name: N/A  . Number of children: N/A  . Years of education: N/A   Occupational History  . sales    Social History Main Topics  . Smoking status: Never Smoker  . Smokeless tobacco: Never Used  . Alcohol use 1.0 oz/week    2 Standard drinks or equivalent per week  . Drug use: No  . Sexual activity: Yes   Other Topics Concern  . None   Social History Narrative  . None     Family Hx: The patient's family history includes Heart attack in his father.  ROS:   Please see the  history of present illness.    ROS All other systems reviewed and are negative.   EKGs/Labs/Other Test Reviewed:    EKG:  EKG is   ordered today.  The ekg ordered today demonstrates sinus brady, HR 56, normal axis, QTc 414 ms  Recent Labs: 06/05/2016: ALT 23 02/04/2017: BUN 26; Creatinine, Ser 1.38; Platelets 237; Potassium 4.5; Sodium 140   Recent Lipid Panel    Component Value Date/Time   CHOL 122 (L) 06/05/2016 1048   TRIG 248 (H) 06/05/2016 1048   HDL 36 (L) 06/05/2016 1048   CHOLHDL 3.4 06/05/2016 1048   VLDL 50 (H) 06/05/2016 1048   LDLCALC 36 06/05/2016 1048   LDLDIRECT 42.0 04/26/2015 0950     Physical Exam:    VS:  BP (!) 160/80   Pulse (!) 56  Ht 5\' 10"  (1.778 m)   Wt 183 lb 12.8 oz (83.4 kg)   BMI 26.37 kg/m     Wt Readings from Last 3 Encounters:  05/08/17 183 lb 12.8 oz (83.4 kg)  04/04/17 182 lb (82.6 kg)  02/04/17 180 lb 8 oz (81.9 kg)     Physical Exam  Constitutional: He is oriented to person, place, and time. He appears well-developed and well-nourished. No distress.  HENT:  Head: Normocephalic and atraumatic.  Eyes: No scleral icterus.  Neck: Normal range of motion. No JVD present.  Cardiovascular: Normal rate, regular rhythm, S1 normal and S2 normal.   No murmur heard. Pulmonary/Chest: Effort normal and breath sounds normal. He has no wheezes. He has no rhonchi. He has no rales.  Abdominal: Soft. There is no tenderness.  Musculoskeletal: He exhibits no edema.  Neurological: He is alert and oriented to person, place, and time.  Skin: Skin is warm and dry.  Psychiatric: He has a normal mood and affect.    ASSESSMENT:    1. Paroxysmal atrial fibrillation (HCC)   2. Coronary artery disease involving native coronary artery of native heart without angina pectoris   3. S/P mitral valve repair   4. Essential hypertension, benign    PLAN:    In order of problems listed above:  1. Paroxysmal atrial fibrillation (HCC) -  CHADS2-VASc=3 (CAD,  71 yo, HTN).  He should remain on anticoagulation for now.  He does have a hx of LAA clipping.  However, he is still at risk for CVA.  Continue Eliquis.  Will get BMET, CBC today.  Plan FU again in 3 mos to reassess his AF burden.    2. Coronary artery disease involving native coronary artery of native heart without angina pectoris - S/p CABG in 2014.  He is not on ASA as he is on Eliquis. Continue statin.    3. S/P mitral valve repair - Continue SBE prophylaxis.    4. Essential hypertension, benign - The patient's blood pressure is controlled on his current regimen.  He has optimal BPs at home.  His BP is always high in the office.  Continue current therapy.    Dispo:  Return in about 3 months (around 08/08/2017) for Routine Follow Up, w/ Dr. Burt Knack.   Medication Adjustments/Labs and Tests Ordered: Current medicines are reviewed at length with the patient today.  Concerns regarding medicines are outlined above.  Orders/Tests:  Orders Placed This Encounter  Procedures  . Basic Metabolic Panel (BMET)  . CBC  . EKG 12-Lead   Medication changes: No orders of the defined types were placed in this encounter.  Signed, [name], PA-C  05/08/2017 10:44 AM    [facility] [place], [place], [place]  [address] Phone: [phone]; Fax: [phone]

## 2017-05-08 NOTE — Telephone Encounter (Signed)
-----   Message from Liliane Shi, [place] sent at 05/08/2017  3:50 PM EDT ----- Please call the patient Kidney function is stable. The hemoglobin is normal. Continue with current treatment plan. [name], PA-C   05/08/2017 3:50 PM

## 2017-05-28 ENCOUNTER — Other Ambulatory Visit: Payer: Self-pay | Admitting: Cardiovascular Disease

## 2017-05-29 ENCOUNTER — Other Ambulatory Visit: Payer: Self-pay | Admitting: Cardiovascular Disease

## 2017-06-26 ENCOUNTER — Other Ambulatory Visit: Payer: Self-pay | Admitting: Cardiovascular Disease

## 2017-06-27 NOTE — Telephone Encounter (Signed)
Pt saw [name], [place] on 05/08/17. Age 71 yrs old, Weight 83.4Kg. SCr was 1.39 on 05/08/17. Will refill Eliquis 5mg  BID.

## 2017-07-22 ENCOUNTER — Telehealth: Payer: Self-pay | Admitting: Cardiovascular Disease

## 2017-07-22 NOTE — Telephone Encounter (Signed)
Spoke with patient's wife who called to ask about broken blood vessels in patient's eye. She states the patient states his eyes have been bothering him with allergy type symptoms. She states she noticed the burst blood vessel approximately 3 weeks ago. I advised her that patient should see his eye doctor and that the doctor will contact us if there is a concern about bleeding in the eye. I reviewed the reason for the eliquis and advised that we would want to evaluate the risk/benefit of holding the eliquis based on if there is bleeding anywhere. Wife verbalized understanding and agreement with plan and will call to get patient an appointment with his eye doctor. She states patient has an appointment on 8/31 with Dr. Burt Knack. I advised her to call back with additional questions or concerns prior to appointment. She thanked me for the call.

## 2017-07-22 NOTE — Telephone Encounter (Signed)
New Message   pt wife verbalized that she is calling for rn   Pt has a broken blood vessels in his right eye and pt is   on blood thinner and she wonders if he should stop taking it

## 2017-08-05 ENCOUNTER — Encounter: Payer: Self-pay | Admitting: *Deleted

## 2017-08-23 ENCOUNTER — Ambulatory Visit (INDEPENDENT_AMBULATORY_CARE_PROVIDER_SITE_OTHER): Payer: BLUE CROSS/BLUE SHIELD | Admitting: Cardiovascular Disease

## 2017-08-23 ENCOUNTER — Encounter: Payer: Self-pay | Admitting: Cardiovascular Disease

## 2017-08-23 VITALS — BP 164/90 | HR 54 | Ht 70.0 in | Wt 180.0 lb

## 2017-08-23 DIAGNOSIS — I341 Nonrheumatic mitral (valve) prolapse: Secondary | ICD-10-CM | POA: Diagnosis not present

## 2017-08-23 DIAGNOSIS — E785 Hyperlipidemia, unspecified: Secondary | ICD-10-CM | POA: Diagnosis not present

## 2017-08-23 DIAGNOSIS — I480 Paroxysmal atrial fibrillation: Secondary | ICD-10-CM | POA: Diagnosis not present

## 2017-08-23 DIAGNOSIS — I10 Essential (primary) hypertension: Secondary | ICD-10-CM

## 2017-08-23 MED ORDER — ASPIRIN EC 81 MG PO TBEC: 81.0000 mg | DELAYED_RELEASE_TABLET | Freq: Every day | ORAL | 3 refills | Status: DC

## 2017-08-23 MED ORDER — SILDENAFIL CITRATE 20 MG PO TABS: 100.0000 mg | ORAL_TABLET | Freq: Every day | ORAL | 11 refills | Status: DC | PRN

## 2017-08-23 NOTE — Patient Instructions (Addendum)
Medication Instructions:  1) STOP ELIQUIS 2) START ASPIRIN 81 mg daily 3) START SILDENAFIL 100 mg daily as needed  Labwork: None  Testing/Procedures: None  Follow-Up: Your provider wants you to follow-up in: 6 months with Dr. Burt Knack. You will receive a reminder letter in the mail two months in advance. If you don't receive a letter, please call our office to schedule the follow-up appointment.    Any Other Special Instructions Will Be Listed Below (If Applicable).     If you need a refill on your cardiac medications before your next appointment, please call your pharmacy.

## 2017-08-23 NOTE — Progress Notes (Signed)
Cardiology Office Note Date:  08/23/2017   ID:  Henry Page, DOB 10/25/1947, MRN [id number]  PCP:  Glenda Chroman, MD  Cardiologist:  Sherren Mocha, MD    Chief Complaint  Patient presents with  . Atrial Fibrillation    follow up     History of Present Illness: Henry Page is a 71 y.o. male who presents for follow-up evaluation.   The patient has a history of coronary artery disease, paroxysmal atrial fibrillation, and mitral regurgitation. He underwent mitral valve repair, CABG, and cryo-Cox maze procedure in 2014. He had an episode of atrial fibrillation with RVR in February 2018 associated with influenza. He underwent TEE guided cardioversion at that time.  The patient had a recurrence of atrial fibrillation and he will was restarted on Eliquis for anticoagulation. He has had no further problems and he checks his pulse every day. His heart rate runs in the 50s and he has had no further atrial fib documented. He would like to stop Eliquis if possible. He feels well and denies symptoms of chest pain, shortness of breath, or heart palpitations.   Past Medical History:  Diagnosis Date  . Acute combined systolic and diastolic congestive heart failure ([place]) 07/06/2013  . Anxiety   . Atrial fibrillation ([place]) 07/06/2013  . CKD (chronic kidney disease), stage III   . Essential hypertension, benign 07/06/2013  . Heart murmur   . Kidney stones    some passed spontaneous, one retrieved by cystoscope   . Mitral regurgitation 07/06/2013  . Mitral valve prolapse 07/06/2013  . 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 S/P Maze operation for atrial fibrillation 07/22/2013   Complete bilateral atrial lesion set using bipolar radiofrequency and cryothermy ablation with clipping of LA appendage   . S/P mitral valve repair 07/22/2013   Complex valvuloplasty including triangular resection of posterior leaflet with artificial  Gore-tex neocord placement x2 and 18mm Sorin Memo 3D ring annuloplasty  . Shortness of breath   . Sleep apnea     Past Surgical History:  Procedure Laterality Date  . CARDIOVERSION N/A 02/08/2017   Procedure: CARDIOVERSION;  Surgeon: Dorothy Spark, MD;  Location: [place];  Service: Cardiovascular;  Laterality: N/A;  . CORONARY ARTERY BYPASS GRAFT N/A 07/22/2013   Procedure: CORONARY ARTERY BYPASS GRAFTING (CABG);  Surgeon: Rexene Alberts, MD;  Location: [place];  Service: Open Heart Surgery;  Laterality: N/A;  . CYSTOSCOPY    . INTRAOPERATIVE TRANSESOPHAGEAL ECHOCARDIOGRAM N/A 07/22/2013   Procedure: INTRAOPERATIVE TRANSESOPHAGEAL ECHOCARDIOGRAM;  Surgeon: Rexene Alberts, MD;  Location: [place];  Service: Open Heart Surgery;  Laterality: N/A;  . LEFT AND RIGHT HEART CATHETERIZATION WITH CORONARY ANGIOGRAM  07/07/2013   Procedure: LEFT AND RIGHT HEART CATHETERIZATION WITH CORONARY ANGIOGRAM;  Surgeon: Thayer Headings, MD;  Location: [facility] CATH LAB;  Service: Cardiovascular;;  . MAZE N/A 07/22/2013   Procedure: MAZE;  Surgeon: Rexene Alberts, MD;  Location: [place];  Service: Open Heart Surgery;  Laterality: N/A;  . MITRAL VALVE REPAIR N/A 07/22/2013   Procedure: MITRAL VALVE REPAIR (MVR);  Surgeon: Rexene Alberts, MD;  Location: [place];  Service: Open Heart Surgery;  Laterality: N/A;  . PATENT FORAMEN OVALE CLOSURE N/A 07/22/2013   Procedure: PATENT FORAMEN OVALE CLOSURE;  Surgeon: Rexene Alberts, MD;  Location: [place];  Service: Open Heart Surgery;  Laterality: N/A;  . TEE  WITHOUT CARDIOVERSION N/A 07/08/2013   Procedure: TRANSESOPHAGEAL ECHOCARDIOGRAM (TEE);  Surgeon: Larey Dresser, MD;  Location: [place];  Service: Cardiovascular;  Laterality: N/A;  . TEE WITHOUT CARDIOVERSION N/A 02/08/2017   Procedure: TRANSESOPHAGEAL ECHOCARDIOGRAM (TEE);  Surgeon: Dorothy Spark, MD;  Location: [facility] ENDOSCOPY;  Service: Cardiovascular;  Laterality: N/A;  . TONSILLECTOMY      Current Outpatient  Prescriptions  Medication Sig Dispense Refill  . amoxicillin (AMOXIL) 500 MG capsule Take 2,000 mg by mouth as directed. 1 hour prior to dental appointments  99  . atorvastatin (LIPITOR) 40 MG tablet TAKE 1 TABLET BY MOUTH EVERY DAY AT 6:00PM 90 tablet 3  . benazepril (LOTENSIN) 10 MG tablet TAKE 1 TABLET BY MOUTH EVERY DAY 30 tablet 11  . ELIQUIS 5 MG TABS tablet TAKE ONE TABLET BY MOUTH TWICE DAILY 180 tablet 3  . hydrochlorothiazide (HYDRODIURIL) 25 MG tablet TAKE 1 TABLET BY MOUTH EVERY DAY 90 tablet 3  . metoprolol tartrate (LOPRESSOR) 25 MG tablet TAKE 1 TABLET BY MOUTH TWICE DAILY 60 tablet 11   No current facility-administered medications for this visit.     Allergies:   Shellfish allergy; Sulfa antibiotics; and Meperidine and related   Social History:  The patient  reports that he has never smoked. He has never used smokeless tobacco. He reports that he drinks about 1.0 oz of alcohol per week . He reports that he does not use drugs.   Family History:  The patient's  family history includes Heart attack in his father.    ROS:  Please see the history of present illness.    All other systems are reviewed and negative.    PHYSICAL EXAM: VS:  BP (!) 164/90   Pulse (!) 54   Ht 5\' 10"  (1.778 m)   Wt 180 lb (81.6 kg)   BMI 25.83 kg/m  , BMI Body mass index is 25.83 kg/m. GEN: Well nourished, well developed, in no acute distress  HEENT: normal  Neck: no JVD, no masses. No carotid bruits Cardiac: RRR without murmur or gallop                Respiratory:  clear to auscultation bilaterally, normal work of breathing GI: soft, nontender, nondistended, + BS MS: no deformity or atrophy  Ext: no pretibial edema, pedal pulses 2+= bilaterally Skin: warm and dry, no rash Neuro:  Strength and sensation are intact Psych: euthymic mood, full affect  EKG:  EKG is not ordered today.  Recent Labs: 05/08/2017: BUN 24; Creatinine, Ser 1.39; Hemoglobin 13.9; Platelets 196; Potassium 4.6;  Sodium 139   Lipid Panel     Component Value Date/Time   CHOL 122 (L) 06/05/2016 1048   TRIG 248 (H) 06/05/2016 1048   HDL 36 (L) 06/05/2016 1048   CHOLHDL 3.4 06/05/2016 1048   VLDL 50 (H) 06/05/2016 1048   LDLCALC 36 06/05/2016 1048   LDLDIRECT 42.0 04/26/2015 0950      Wt Readings from Last 3 Encounters:  08/23/17 180 lb (81.6 kg)  05/08/17 183 lb 12.8 oz (83.4 kg)  04/04/17 182 lb (82.6 kg)     Cardiac Studies Reviewed: TEE 02/08/2017: Study Conclusions  - Left ventricle: There was mild concentric hypertrophy. Systolic   function was normal. The estimated ejection fraction was in the   range of 50% to 55%. Diffuse hypokinesis. - Aortic valve: Structurally normal valve. Trileaflet; normal   thickness leaflets. No evidence of vegetation. - Aorta: There was mild non-mobile atheroma. - Ascending aorta:  The ascending aorta was upper noral size   measuring 40 mm. - Mitral valve: Leaflet separation was normal. Mean gradient (D): 2   mm Hg. - Left atrium: The atrium was dilated. No evidence of thrombus in   the atrial cavity or appendage. No evidence of thrombus. The   appendage was ligated with no residual flow. - Right ventricle: The cavity size was normal. Wall thickness was   normal. - Right atrium: No evidence of thrombus in the atrial cavity or   appendage. - Pulmonary arteries: Systolic pressure was within the normal   range.  Impressions:  - The appendage was ligated with no residual flow. No thrombus was   seen in the left atrium.   TEE was followed by a successful cardioversion.  ASSESSMENT AND PLAN: 1.  Paroxysmal atrial fibrillation. The patient is maintaining sinus rhythm. We discussed pros and cons of continued anticoagulation. I reviewed his TEE study from February. His left atrial appendage is completely ligated with no residual flow. Considering complete ligation of the appendage and the fact that he is monitoring his pulse every day and maintaining  sinus rhythm, I think he can stop Eliquis and go back to aspirin 81 mg daily.  2. Coronary artery disease, native vessel, without angina: Continue current medical program.  3. Hypertension: Patient has a long-standing history of whitecoat hypertension. His home blood pressure averages around 120/70. He's treated with an ACE inhibitor, thiazide diuretic, and beta blocker. No changes are recommended today.  4. Hyperlipidemia: The patient is treated with a statin drug. His most recent lipids are reviewed as above. LDL cholesterol is at goal of less than 70 mg/dL.  Current medicines are reviewed with the patient today.  The patient does not have concerns regarding medicines.  Labs/ tests ordered today include:  No orders of the defined types were placed in this encounter.   Disposition:   FU 6 months  Signed, Sherren Mocha, MD  08/23/2017 9:56 AM    [facility] [place], [place], [place]  [address] Phone: [phone]; Fax: [phone]

## 2017-10-08 DIAGNOSIS — Z23 Encounter for immunization: Secondary | ICD-10-CM | POA: Diagnosis not present

## 2018-03-31 ENCOUNTER — Other Ambulatory Visit: Payer: BLUE CROSS/BLUE SHIELD | Admitting: *Deleted

## 2018-03-31 DIAGNOSIS — I10 Essential (primary) hypertension: Secondary | ICD-10-CM

## 2018-03-31 DIAGNOSIS — E785 Hyperlipidemia, unspecified: Secondary | ICD-10-CM | POA: Diagnosis not present

## 2018-03-31 DIAGNOSIS — I480 Paroxysmal atrial fibrillation: Secondary | ICD-10-CM

## 2018-03-31 LAB — LIPID PANEL
CHOLESTEROL TOTAL: 113 mg/dL (ref 100–199)
Chol/HDL Ratio: 3.8 ratio (ref 0.0–5.0)
HDL: 30 mg/dL — AB (ref >39)
LDL CALC: 31 mg/dL (ref 0–99)
TRIGLYCERIDES: 262 mg/dL — AB (ref 0–149)
VLDL Cholesterol Cal: 52 mg/dL — ABNORMAL HIGH (ref 5–40)

## 2018-04-07 ENCOUNTER — Encounter: Payer: Self-pay | Admitting: Cardiovascular Disease

## 2018-04-07 ENCOUNTER — Ambulatory Visit (INDEPENDENT_AMBULATORY_CARE_PROVIDER_SITE_OTHER): Payer: BLUE CROSS/BLUE SHIELD | Admitting: Cardiovascular Disease

## 2018-04-07 VITALS — BP 140/82 | HR 58 | Ht 70.0 in | Wt 188.0 lb

## 2018-04-07 DIAGNOSIS — I480 Paroxysmal atrial fibrillation: Secondary | ICD-10-CM

## 2018-04-07 DIAGNOSIS — I2510 Atherosclerotic heart disease of native coronary artery without angina pectoris: Secondary | ICD-10-CM | POA: Diagnosis not present

## 2018-04-07 DIAGNOSIS — I341 Nonrheumatic mitral (valve) prolapse: Secondary | ICD-10-CM | POA: Diagnosis not present

## 2018-04-07 NOTE — Progress Notes (Signed)
Cardiology Office Note Date:  04/07/2018   ID:  Henry Page, DOB 10/25/1947, MRN [id number]  PCP:  Glenda Chroman, MD  Cardiologist:  Sherren Mocha, MD    Chief Complaint  Patient presents with  . Follow-up    CAD/Mitral Valve Disease   History of Present Illness: Henry Page is a 72 y.o. male who presents for follow-up evaluation.   The patient has a history of coronary artery disease, paroxysmal atrial fibrillation, and mitral regurgitation. He underwent mitral valve repair, CABG, and cryo-Cox maze procedure in 2014. He had an episode of atrial fibrillation with RVR in February 2018 associated with influenza. He underwent TEE guided cardioversion at that time.  He's here with his wife today. He monitors BP at home and it has been in a good range. Last night his BP was 117/63 and the highest systolic reading he has seen is 134 mmHg.  He has some limitation from knee pain.  He has not experienced any recent shortness of breath, chest discomfort, edema, orthopnea, or PND.  He has occasional heart palpitations.  When he checks his pulse it is always in the 50s or 60s.  Past Medical History:  Diagnosis Date  . Acute combined systolic and diastolic congestive heart failure ([place]) 07/06/2013  . Anxiety   . Atrial fibrillation ([place]) 07/06/2013  . CKD (chronic kidney disease), stage III ([place])   . Essential hypertension, benign 07/06/2013  . Heart murmur   . Kidney stones    some passed spontaneous, one retrieved by cystoscope   . Mitral regurgitation 07/06/2013  . Mitral valve prolapse 07/06/2013  . 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 S/P Maze operation for atrial fibrillation 07/22/2013   Complete bilateral atrial lesion set using bipolar radiofrequency and cryothermy ablation with clipping of LA appendage   . S/P mitral valve repair 07/22/2013   Complex valvuloplasty including triangular resection of posterior  leaflet with artificial Gore-tex neocord placement x2 and 60mm Sorin Memo 3D ring annuloplasty  . Shortness of breath   . Sleep apnea     Past Surgical History:  Procedure Laterality Date  . CARDIOVERSION N/A 02/08/2017   Procedure: CARDIOVERSION;  Surgeon: Dorothy Spark, MD;  Location: [place];  Service: Cardiovascular;  Laterality: N/A;  . CORONARY ARTERY BYPASS GRAFT N/A 07/22/2013   Procedure: CORONARY ARTERY BYPASS GRAFTING (CABG);  Surgeon: Rexene Alberts, MD;  Location: [facility];  Service: Open Heart Surgery;  Laterality: N/A;  . CYSTOSCOPY    . INTRAOPERATIVE TRANSESOPHAGEAL ECHOCARDIOGRAM N/A 07/22/2013   Procedure: INTRAOPERATIVE TRANSESOPHAGEAL ECHOCARDIOGRAM;  Surgeon: Rexene Alberts, MD;  Location: [facility];  Service: Open Heart Surgery;  Laterality: N/A;  . LEFT AND RIGHT HEART CATHETERIZATION WITH CORONARY ANGIOGRAM  07/07/2013   Procedure: LEFT AND RIGHT HEART CATHETERIZATION WITH CORONARY ANGIOGRAM;  Surgeon: Thayer Headings, MD;  Location: [facility] CATH LAB;  Service: Cardiovascular;;  . MAZE N/A 07/22/2013   Procedure: MAZE;  Surgeon: Rexene Alberts, MD;  Location: [facility];  Service: Open Heart Surgery;  Laterality: N/A;  . MITRAL VALVE REPAIR N/A 07/22/2013   Procedure: MITRAL VALVE REPAIR (MVR);  Surgeon: Rexene Alberts, MD;  Location: [facility];  Service: Open Heart Surgery;  Laterality: N/A;  . PATENT FORAMEN OVALE CLOSURE N/A 07/22/2013   Procedure: PATENT FORAMEN OVALE CLOSURE;  Surgeon: Rexene Alberts, MD;  Location: [place];  Service:  Open Heart Surgery;  Laterality: N/A;  . TEE WITHOUT CARDIOVERSION N/A 07/08/2013   Procedure: TRANSESOPHAGEAL ECHOCARDIOGRAM (TEE);  Surgeon: Larey Dresser, MD;  Location: [facility];  Service: Cardiovascular;  Laterality: N/A;  . TEE WITHOUT CARDIOVERSION N/A 02/08/2017   Procedure: TRANSESOPHAGEAL ECHOCARDIOGRAM (TEE);  Surgeon: Dorothy Spark, MD;  Location: [facility] ENDOSCOPY;  Service: Cardiovascular;  Laterality: N/A;  . TONSILLECTOMY       Current Outpatient Medications  Medication Sig Dispense Refill  . amoxicillin (AMOXIL) 500 MG capsule Take 2,000 mg by mouth as directed. 1 hour prior to dental appointments  99  . aspirin EC 81 MG tablet Take 1 tablet (81 mg total) by mouth daily. 90 tablet 3  . atorvastatin (LIPITOR) 40 MG tablet TAKE 1 TABLET BY MOUTH EVERY DAY AT 6:00PM 90 tablet 3  . benazepril (LOTENSIN) 10 MG tablet TAKE 1 TABLET BY MOUTH EVERY DAY 30 tablet 11  . hydrochlorothiazide (HYDRODIURIL) 25 MG tablet TAKE 1 TABLET BY MOUTH EVERY DAY 90 tablet 3  . metoprolol tartrate (LOPRESSOR) 25 MG tablet TAKE 1 TABLET BY MOUTH TWICE DAILY 60 tablet 11  . sildenafil (REVATIO) 20 MG tablet Take 5 tablets (100 mg total) by mouth daily as needed. 60 tablet 11   No current facility-administered medications for this visit.     Allergies:   Shellfish allergy; Sulfa antibiotics; and Meperidine and related   Social History:  The patient  reports that he has never smoked. He has never used smokeless tobacco. He reports that he drinks about 1.0 oz of alcohol per week. He reports that he does not use drugs.   Family History:  The patient's family history includes Heart attack in his father.    ROS:  Please see the history of present illness.  Otherwise, review of systems is positive for knee pain, wrist pain.  All other systems are reviewed and negative.    PHYSICAL EXAM: VS:  BP 140/82   Pulse (!) 58   Ht 5\' 10"  (1.778 m)   Wt 188 lb (85.3 kg)   SpO2 97%   BMI 26.98 kg/m  , BMI Body mass index is 26.98 kg/m. GEN: Well nourished, well developed, in no acute distress  HEENT: normal  Neck: no JVD, no masses. No carotid bruits Cardiac: RRR without murmur or gallop                Respiratory:  clear to auscultation bilaterally, normal work of breathing GI: soft, nontender, nondistended, + BS MS: no deformity or atrophy  Ext: no pretibial edema, pedal pulses 2+= bilaterally Skin: warm and dry, no rash Neuro:   Strength and sensation are intact Psych: euthymic mood, full affect  EKG:  EKG is ordered today. The ekg ordered today shows sinus bradycardia 58 bpm, otherwise within normal limits  Recent Labs: 05/08/2017: BUN 24; Creatinine, Ser 1.39; Hemoglobin 13.9; Platelets 196; Potassium 4.6; Sodium 139   Lipid Panel     Component Value Date/Time   CHOL 113 03/31/2018 0902   TRIG 262 (H) 03/31/2018 0902   HDL 30 (L) 03/31/2018 0902   CHOLHDL 3.8 03/31/2018 0902   CHOLHDL 3.4 06/05/2016 1048   VLDL 50 (H) 06/05/2016 1048   LDLCALC 31 03/31/2018 0902   LDLDIRECT 42.0 04/26/2015 0950      Wt Readings from Last 3 Encounters:  04/07/18 188 lb (85.3 kg)  08/23/17 180 lb (81.6 kg)  05/08/17 183 lb 12.8 oz (83.4 kg)     Cardiac Studies Reviewed: TEE  02-08-2017: Study Conclusions  - Left ventricle: There was mild concentric hypertrophy. Systolic   function was normal. The estimated ejection fraction was in the   range of 50% to 55%. Diffuse hypokinesis. - Aortic valve: Structurally normal valve. Trileaflet; normal   thickness leaflets. No evidence of vegetation. - Aorta: There was mild non-mobile atheroma. - Ascending aorta: The ascending aorta was upper noral size   measuring 40 mm. - Mitral valve: Leaflet separation was normal. Mean gradient (D): 2   mm Hg. - Left atrium: The atrium was dilated. No evidence of thrombus in   the atrial cavity or appendage. No evidence of thrombus. The   appendage was ligated with no residual flow. - Right ventricle: The cavity size was normal. Wall thickness was   normal. - Right atrium: No evidence of thrombus in the atrial cavity or   appendage. - Pulmonary arteries: Systolic pressure was within the normal   range.  Impressions:  - The appendage was ligated with no residual flow. No thrombus was   seen in the left atrium.   TEE was followed by a successful cardioversion.  ASSESSMENT AND PLAN: 1.  Coronary artery disease, native vessel,  without angina: The patient is stable without recurrent angina ever since undergoing bypass surgery.  No changes are made in his medical regimen today.  2.  Hypertension: Blood pressure is controlled based on home readings.  He will continue on benazepril and metoprolol as well as hydrochlorothiazide.  3.  Hyperlipidemia: The patient is treated with atorvastatin.  No changes are made in his medications today.   Last lipids are excellent with a total cholesterol of 113, HDL 30, LDL 31.  4.  Paroxysmal atrial fibrillation: Maintaining sinus rhythm.  See previous discussions regarding anticoagulation.  With rare episodes of atrial fibrillation and previous left atrial appendage ligation with no residual flow seen on TEE, he continues on antiplatelet therapy alone.  If he has recurrent atrial fibrillation documented will likely start him back on oral anticoagulation.  Current medicines are reviewed with the patient today.  The patient does not have concerns regarding medicines.  Labs/ tests ordered today include:   Orders Placed This Encounter  Procedures  . EKG 12-Lead    Disposition:   FU 6 months  Signed, Sherren Mocha, MD  04/07/2018 5:51 PM    [facility] [place], [place], [place]  [address] Phone: [phone]; Fax: [phone]

## 2018-04-07 NOTE — Patient Instructions (Signed)

## 2018-05-23 ENCOUNTER — Other Ambulatory Visit: Payer: Self-pay | Admitting: Cardiovascular Disease

## 2018-09-01 ENCOUNTER — Other Ambulatory Visit: Payer: Self-pay | Admitting: *Deleted

## 2018-09-01 MED ORDER — SILDENAFIL CITRATE 20 MG PO TABS: 100.0000 mg | ORAL_TABLET | Freq: Every day | ORAL | 11 refills | Status: DC | PRN

## 2018-09-01 NOTE — Telephone Encounter (Signed)
Okay to refill? Please advise. Thanks, MI 

## 2018-09-09 ENCOUNTER — Telehealth: Payer: Self-pay | Admitting: Cardiovascular Disease

## 2018-09-09 NOTE — Telephone Encounter (Signed)
New Message   Patient c/o Palpitations:  High priority if patient c/o lightheadedness, shortness of breath, or chest pain  1) How long have you had palpitations/irregular HR/ Afib? Are you having the symptoms now? Patients wife states that he has been in afib for about a week and is currently in afib now  2) Are you currently experiencing lightheadedness, SOB or CP? No symptoms   3) Do you have a history of afib (atrial fibrillation) or irregular heart rhythm? yes  4) Have you checked your BP or HR? (document readings if available): BP 130/72    5) Are you experiencing any other symptoms? no

## 2018-09-09 NOTE — Progress Notes (Signed)
Cardiology Office Note    Date:  09/10/2018   ID:  Henry Page, DOB 10/25/1947, MRN [id number]  PCP:  Glenda Chroman, MD  Cardiologist: Sherren Mocha, MD EPS: None  No chief complaint on file.   History of Present Illness:  Henry Page is a 72 y.o. male with history of CAD status post CABG, mitral valve repair and cryo-Cox-Maze procedure 2014.  Episode of atrial fib with RVR 01/2017 associated with influenza status post TEE guided cardioversion .  Last saw Dr. Burt Knack 04/07/2018 and was doing well without angina.  He was maintaining normal sinus rhythm.  With rare episodes of atrial fibrillation and previous left atrial appendage ligation and no residual flow seen on TEE he has been maintained on antiplatelet therapy alone.  If he has recurrent atrial fibrillation documented he will likely need to be started back on oral anticoagulation.  Patient called in yesterday stating that he thinks he has been in atrial fibrillation for about a week. He says his heart rate never goes over 100. Walked 2 miles/day without difficulty. Feels an anxiousness in his heart and is more fatigued at the end of the day but overall tolerates it well. Doesn't like to take eliquis-bruising.      Past Medical History:  Diagnosis Date  . Acute combined systolic and diastolic congestive heart failure ([place]) 07/06/2013  . Anxiety   . Atrial fibrillation ([place]) 07/06/2013  . CKD (chronic kidney disease), stage III ([place])   . Essential hypertension, benign 07/06/2013  . Heart murmur   . Kidney stones    some passed spontaneous, one retrieved by cystoscope   . Mitral regurgitation 07/06/2013  . Mitral valve prolapse 07/06/2013  . 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 S/P Maze operation for atrial fibrillation 07/22/2013   Complete bilateral atrial lesion set using bipolar radiofrequency and cryothermy ablation with clipping of LA appendage     . S/P mitral valve repair 07/22/2013   Complex valvuloplasty including triangular resection of posterior leaflet with artificial Gore-tex neocord placement x2 and 72m Sorin Memo 3D ring annuloplasty  . Shortness of breath   . Sleep apnea     Past Surgical History:  Procedure Laterality Date  . CARDIOVERSION N/A 02/08/2017   Procedure: CARDIOVERSION;  Surgeon: KDorothy Spark MD;  Location: [facility]  Service: Cardiovascular;  Laterality: N/A;  . CORONARY ARTERY BYPASS GRAFT N/A 07/22/2013   Procedure: CORONARY ARTERY BYPASS GRAFTING (CABG);  Surgeon: CRexene Alberts MD;  Location: [facility]  Service: Open Heart Surgery;  Laterality: N/A;  . CYSTOSCOPY    . INTRAOPERATIVE TRANSESOPHAGEAL ECHOCARDIOGRAM N/A 07/22/2013   Procedure: INTRAOPERATIVE TRANSESOPHAGEAL ECHOCARDIOGRAM;  Surgeon: CRexene Alberts MD;  Location: [place]  Service: Open Heart Surgery;  Laterality: N/A;  . LEFT AND RIGHT HEART CATHETERIZATION WITH CORONARY ANGIOGRAM  07/07/2013   Procedure: LEFT AND RIGHT HEART CATHETERIZATION WITH CORONARY ANGIOGRAM;  Surgeon: PThayer Headings MD;  [facility];  Service: Cardiovascular;;  . MAZE N/A 07/22/2013   Procedure: MAZE;  Surgeon: CRexene Alberts MD;  [place]  Service: Open Heart Surgery;  Laterality: N/A;  . MITRAL VALVE REPAIR N/A 07/22/2013   Procedure: MITRAL VALVE REPAIR (MVR);  Surgeon: CRexene Alberts MD;  [place]  Service: Open Heart Surgery;  Laterality: N/A;  . PATENT FORAMEN OVALE CLOSURE N/A 07/22/2013   Procedure:  PATENT FORAMEN OVALE CLOSURE;  Surgeon: Rexene Alberts, MD;  Location: [place];  Service: Open Heart Surgery;  Laterality: N/A;  . TEE WITHOUT CARDIOVERSION N/A 07/08/2013   Procedure: TRANSESOPHAGEAL ECHOCARDIOGRAM (TEE);  Surgeon: Larey Dresser, MD;  Location: [place];  Service: Cardiovascular;  Laterality: N/A;  . TEE WITHOUT CARDIOVERSION N/A 02/08/2017   Procedure: TRANSESOPHAGEAL ECHOCARDIOGRAM (TEE);  Surgeon: Dorothy Spark, MD;  Location: [facility] ENDOSCOPY;  Service: Cardiovascular;  Laterality: N/A;  . TONSILLECTOMY      Current Medications: Current Meds  Medication Sig  . atorvastatin (LIPITOR) 40 MG tablet TAKE ONE TABLET BY MOUTH EVERY DAY AT 6:00PM  . benazepril (LOTENSIN) 10 MG tablet TAKE 1 TABLET BY MOUTH EVERY DAY  . hydrochlorothiazide (HYDRODIURIL) 25 MG tablet TAKE ONE TABLET BY MOUTH EVERY DAY  . metoprolol tartrate (LOPRESSOR) 25 MG tablet TAKE 1 TABLET BY MOUTH TWICE DAILY  . sildenafil (REVATIO) 20 MG tablet Take 5 tablets (100 mg total) by mouth daily as needed.  . [DISCONTINUED] aspirin EC 81 MG tablet Take 1 tablet (81 mg total) by mouth daily.     Allergies:   Shellfish allergy; Sulfa antibiotics; and Meperidine and related   Social History   Socioeconomic History  . Marital status: Married    Spouse name: Not on file  . Number of children: Not on file  . Years of education: Not on file  . Highest education level: Not on file  Occupational History  . Occupation: Geographical information systems officer  . Financial resource strain: Not on file  . Food insecurity:    Worry: Not on file    Inability: Not on file  . Transportation needs:    Medical: Not on file    Non-medical: Not on file  Tobacco Use  . Smoking status: Never Smoker  . Smokeless tobacco: Never Used  Substance and Sexual Activity  . Alcohol use: Yes    Alcohol/week: 2.0 standard drinks    Types: 2 Standard drinks or equivalent per week  . Drug use: No  . Sexual activity: Yes  Lifestyle  . Physical activity:    Days per week: Not on file    Minutes per session: Not on file  . Stress: Not on file  Relationships  . Social connections:    Talks on phone: Not on file    Gets together: Not on file    Attends religious service: Not on file    Active member of club or organization: Not on file    Attends meetings of clubs or organizations: Not on file    Relationship status: Not on file  Other Topics Concern  . Not on  file  Social History Narrative  . Not on file     Family History:  The patient's family history includes Heart attack in his father.   ROS:   Please see the history of present illness.    Review of Systems  Constitution: Positive for malaise/fatigue.  HENT: Negative.   Cardiovascular: Positive for irregular heartbeat and palpitations.  Respiratory: Negative.   Endocrine: Negative.   Hematologic/Lymphatic: Bruises/bleeds easily.  Musculoskeletal: Negative.   Gastrointestinal: Negative.   Genitourinary: Negative.   Neurological: Negative.    All other systems reviewed and are negative.   PHYSICAL EXAM:   VS:  BP 130/84   Pulse 90   Ht '5\' 10"'  (1.778 m)   Wt 181 lb 6.4 oz (82.3 kg)   SpO2 98%   BMI 26.03 kg/m  Physical Exam  GEN: Well nourished, well developed, in no acute distress  Neck: no JVD, carotid bruits, or masses Cardiac: Irregular irregular no murmurs, rubs, or gallops  Respiratory:  clear to auscultation bilaterally, normal work of breathing GI: soft, nontender, nondistended, + BS Ext: without cyanosis, clubbing, or edema, Good distal pulses bilaterally Neuro:  Alert and Oriented x 3 Psych: euthymic mood, full affect  Wt Readings from Last 3 Encounters:  09/10/18 181 lb 6.4 oz (82.3 kg)  04/07/18 188 lb (85.3 kg)  08/23/17 180 lb (81.6 kg)      Studies/Labs Reviewed:   EKG:  EKG is  ordered today.  The ekg ordered today demonstrates atrial fibrillation at 90 bpm  Recent Labs: No results found for requested labs within last 8760 hours.   Lipid Panel    Component Value Date/Time   CHOL 113 03/31/2018 0902   TRIG 262 (H) 03/31/2018 0902   HDL 30 (L) 03/31/2018 0902   CHOLHDL 3.8 03/31/2018 0902   CHOLHDL 3.4 06/05/2016 1048   VLDL 50 (H) 06/05/2016 1048   LDLCALC 31 03/31/2018 0902   LDLDIRECT 42.0 04/26/2015 0950    Additional studies/ records that were reviewed today include:   TEE 02-08-2017: Study Conclusions   - Left ventricle: There  was mild concentric hypertrophy. Systolic   function was normal. The estimated ejection fraction was in the   range of 50% to 55%. Diffuse hypokinesis. - Aortic valve: Structurally normal valve. Trileaflet; normal   thickness leaflets. No evidence of vegetation. - Aorta: There was mild non-mobile atheroma. - Ascending aorta: The ascending aorta was upper noral size   measuring 40 mm. - Mitral valve: Leaflet separation was normal. Mean gradient (D): 2   mm Hg. - Left atrium: The atrium was dilated. No evidence of thrombus in   the atrial cavity or appendage. No evidence of thrombus. The   appendage was ligated with no residual flow. - Right ventricle: The cavity size was normal. Wall thickness was   normal. - Right atrium: No evidence of thrombus in the atrial cavity or   appendage. - Pulmonary arteries: Systolic pressure was within the normal   range.   Impressions:   - The appendage was ligated with no residual flow. No thrombus was   seen in the left atrium.   TEE was followed by a successful cardioversion.      ASSESSMENT:    1. Paroxysmal atrial fibrillation (HCC)   2. Coronary artery disease involving native coronary artery of native heart without angina pectoris   3. S/P CABG x 4   4. S/P Maze operation for atrial fibrillation   5. S/P mitral valve repair   6. Essential hypertension, benign   7. Mixed hyperlipidemia      PLAN:  In order of problems listed above:  PAF with previous left atrial appendage ligation no residual flow seen on TEE and has been maintained on antiplatelet therapy alone.  Dr. Burt Knack stated if he had recurrent atrial fibrillation that he needed anticoagulated.  Patient is back in atrial fibrillation and is probably been in it for at least 5 days.  Discussed with Dr. Burt Knack who concurs the patient should start Eliquis 5 mg twice daily.  Creatinine was 1.39-04/2017.  Will need be met in 1 to 2 weeks.  Refer to A. fib clinic to discuss possible  ablation/cardioversion/Tikosyn  CAD status post CABG, mitral valve repair and maze procedure in 2014 without angina  Essential hypertension blood pressure well controlled  Mixed hyperlipidemia on Lipitor LDL 31 03/2018    Medication Adjustments/Labs and Tests Ordered: Current medicines are reviewed at length with the patient today.  Concerns regarding medicines are outlined above.  Medication changes, Labs and Tests ordered today are listed in the Patient Instructions below. Patient Instructions  Medication Instructions:  Your physician has recommended you make the following change in your medication:   1. RESTART: eliquis 5 mg twice a day  2. STOP: aspirin  Labwork: None ordered  Testing/Procedures: None ordered  Follow-Up: You have been referred to the Atrial Fibrillation Clinic to discuss ablation or tikosyn start: Appointment is 09/25/18 at 11:30 AM with Roderic Palau, NP    Any Other Special Instructions Will Be Listed Below (If Applicable).     If you need a refill on your cardiac medications before your next appointment, please call your pharmacy.      Sumner Boast, PA-C  09/10/2018 11:36 AM    [facility] [place], [place], [place]  [address] Phone: [phone]; Fax: [phone]

## 2018-09-09 NOTE — Telephone Encounter (Signed)
Follow uip   Patients wife is calling back in reference to patient being in Afib.  Please call.  Patient c/o Palpitations:  High priority if patient c/o lightheadedness, shortness of breath, or chest pain  1) How long have you had palpitations/irregular HR/ Afib? Are you having the symptoms now?  Patients wife states that he has been in afib for about a week and is currently in afib now  2) Are you currently experiencing lightheadedness, SOB or CP? No but the afib is more consistent  3) Do you have a history of afib (atrial fibrillation) or irregular heart rhythm? Yes   4) Have you checked your BP or HR? (document readings if available): BP 130/72  5) Are you experiencing any other symptoms? No

## 2018-09-09 NOTE — Telephone Encounter (Signed)
Late entry from 1035 this AM:  The patient reports he has been in atrial fibrillation for about 1 week. He feels fine, his heart is just out of rhythm. He specifically denies CP, SOB, racing heart. When he palpates his pulse it is staying right around 69 bpm. He does not feel dizzy like he did when he had to have a cardioversion last time.  He does occasionally get slightly orthostatic when he stands up, but this is a chronic issue and is not worse since he has been out of rhythm.  He is still walking 1-2 miles every morning with no issues.  BP=130s/70s.  He is taking his medications as directed. Scheduled the patient for OV with Gerrianne Scale tomorrow for EKG and check-up.  The patient understands to seek emergency medical attention if his heart starts to race or other symptoms occur.  He was grateful for assistance.

## 2018-09-10 ENCOUNTER — Ambulatory Visit (INDEPENDENT_AMBULATORY_CARE_PROVIDER_SITE_OTHER): Payer: BLUE CROSS/BLUE SHIELD | Admitting: Physician Assistant

## 2018-09-10 ENCOUNTER — Encounter: Payer: Self-pay | Admitting: Physician Assistant

## 2018-09-10 VITALS — BP 130/84 | HR 90 | Ht 70.0 in | Wt 181.4 lb

## 2018-09-10 DIAGNOSIS — Z951 Presence of aortocoronary bypass graft: Secondary | ICD-10-CM

## 2018-09-10 DIAGNOSIS — I480 Paroxysmal atrial fibrillation: Secondary | ICD-10-CM

## 2018-09-10 DIAGNOSIS — I10 Essential (primary) hypertension: Secondary | ICD-10-CM

## 2018-09-10 DIAGNOSIS — Z9889 Other specified postprocedural states: Secondary | ICD-10-CM | POA: Diagnosis not present

## 2018-09-10 DIAGNOSIS — Z8679 Personal history of other diseases of the circulatory system: Secondary | ICD-10-CM

## 2018-09-10 DIAGNOSIS — I2510 Atherosclerotic heart disease of native coronary artery without angina pectoris: Secondary | ICD-10-CM | POA: Diagnosis not present

## 2018-09-10 DIAGNOSIS — E782 Mixed hyperlipidemia: Secondary | ICD-10-CM

## 2018-09-10 MED ORDER — APIXABAN 5 MG PO TABS: 5.0000 mg | ORAL_TABLET | Freq: Two times a day (BID) | ORAL | 6 refills | Status: DC

## 2018-09-10 NOTE — Patient Instructions (Addendum)
Medication Instructions:  Your physician has recommended you make the following change in your medication:   1. RESTART: eliquis 5 mg twice a day  2. STOP: aspirin  Labwork: None ordered  Testing/Procedures: None ordered  Follow-Up: You have been referred to the Atrial Fibrillation Clinic to discuss ablation or tikosyn start: Appointment is 09/25/18 at 11:30 AM with Roderic Palau, NP    Any Other Special Instructions Will Be Listed Below (If Applicable).     If you need a refill on your cardiac medications before your next appointment, please call your pharmacy.

## 2018-09-18 ENCOUNTER — Encounter: Payer: Self-pay | Admitting: Physician Assistant

## 2018-09-25 ENCOUNTER — Ambulatory Visit (HOSPITAL_COMMUNITY)
Admission: RE | Admit: 2018-09-25 | Discharge: 2018-09-25 | Disposition: A | Discharge status: Home / Self Care | Payer: BLUE CROSS/BLUE SHIELD | Source: Ambulatory Visit | Attending: Nurse Practitioner | Admitting: Nurse Practitioner

## 2018-09-25 ENCOUNTER — Encounter (HOSPITAL_COMMUNITY): Payer: Self-pay | Admitting: Nurse Practitioner

## 2018-09-25 VITALS — BP 136/94 | HR 101 | Ht 70.0 in | Wt 185.6 lb

## 2018-09-25 DIAGNOSIS — I129 Hypertensive chronic kidney disease with stage 1 through stage 4 chronic kidney disease, or unspecified chronic kidney disease: Secondary | ICD-10-CM | POA: Diagnosis not present

## 2018-09-25 DIAGNOSIS — Z882 Allergy status to sulfonamides status: Secondary | ICD-10-CM | POA: Insufficient documentation

## 2018-09-25 DIAGNOSIS — Z8679 Personal history of other diseases of the circulatory system: Secondary | ICD-10-CM

## 2018-09-25 DIAGNOSIS — Z87442 Personal history of urinary calculi: Secondary | ICD-10-CM | POA: Insufficient documentation

## 2018-09-25 DIAGNOSIS — I4819 Other persistent atrial fibrillation: Secondary | ICD-10-CM

## 2018-09-25 DIAGNOSIS — N183 Chronic kidney disease, stage 3 (moderate): Secondary | ICD-10-CM | POA: Diagnosis not present

## 2018-09-25 DIAGNOSIS — Z885 Allergy status to narcotic agent status: Secondary | ICD-10-CM | POA: Insufficient documentation

## 2018-09-25 DIAGNOSIS — Z7901 Long term (current) use of anticoagulants: Secondary | ICD-10-CM | POA: Diagnosis not present

## 2018-09-25 DIAGNOSIS — Z951 Presence of aortocoronary bypass graft: Secondary | ICD-10-CM | POA: Insufficient documentation

## 2018-09-25 DIAGNOSIS — F419 Anxiety disorder, unspecified: Secondary | ICD-10-CM | POA: Diagnosis not present

## 2018-09-25 DIAGNOSIS — Z8249 Family history of ischemic heart disease and other diseases of the circulatory system: Secondary | ICD-10-CM | POA: Diagnosis not present

## 2018-09-25 DIAGNOSIS — I10 Essential (primary) hypertension: Secondary | ICD-10-CM | POA: Diagnosis not present

## 2018-09-25 DIAGNOSIS — Z9889 Other specified postprocedural states: Secondary | ICD-10-CM

## 2018-09-25 DIAGNOSIS — I4811 Longstanding persistent atrial fibrillation: Secondary | ICD-10-CM | POA: Diagnosis not present

## 2018-09-25 DIAGNOSIS — G4730 Sleep apnea, unspecified: Secondary | ICD-10-CM | POA: Diagnosis not present

## 2018-09-25 DIAGNOSIS — Z952 Presence of prosthetic heart valve: Secondary | ICD-10-CM | POA: Diagnosis not present

## 2018-09-25 DIAGNOSIS — Z79899 Other long term (current) drug therapy: Secondary | ICD-10-CM | POA: Insufficient documentation

## 2018-09-25 DIAGNOSIS — I4891 Unspecified atrial fibrillation: Secondary | ICD-10-CM | POA: Diagnosis present

## 2018-09-25 LAB — BASIC METABOLIC PANEL
Anion gap: 8 (ref 5–15)
BUN: 21 mg/dL (ref 8–23)
CALCIUM: 9.6 mg/dL (ref 8.9–10.3)
CO2: 27 mmol/L (ref 22–32)
Chloride: 102 mmol/L (ref 98–111)
Creatinine, Ser: 1.38 mg/dL — ABNORMAL HIGH (ref 0.61–1.24)
GFR calc Af Amer: 58 mL/min — ABNORMAL LOW (ref >60)
GFR, EST NON AFRICAN AMERICAN: 50 mL/min — AB (ref >60)
GLUCOSE: 101 mg/dL — AB (ref 70–99)
Potassium: 4 mmol/L (ref 3.5–5.1)
Sodium: 137 mmol/L (ref 135–145)

## 2018-09-25 LAB — CBC
HEMATOCRIT: 42.1 % (ref 39.0–52.0)
Hemoglobin: 14 g/dL (ref 13.0–17.0)
MCH: 32.2 pg (ref 26.0–34.0)
MCHC: 33.3 g/dL (ref 30.0–36.0)
MCV: 96.8 fL (ref 78.0–100.0)
PLATELETS: 248 10*3/uL (ref 150–400)
RBC: 4.35 MIL/uL (ref 4.22–5.81)
RDW: 13.6 % (ref 11.5–15.5)
WBC: 9.2 10*3/uL (ref 4.0–10.5)

## 2018-09-25 NOTE — H&P (View-Only) (Signed)
Primary Care Physician: Glenda Chroman, MD Primary Cardiologist: Burt Knack Referring Physician: Darol Cush is a 72 y.o. male with a history of persistent atrial fibrillation who presents for consultation in the [facility].  He is s/p CABG, MVR and MAZE in 2014.  He did very well without recurrence of AF until 2018.  At that time, he underwent TEE guided cardioversion and has not had recurrent atrial fibrillation until last month.  He was seen in the office and restarted on anticoagulation with which he has been compliant.  He presents today to discuss options for treatment of atrial fibrillation.  He is symptomatic with increased fatigue and exercise intolerance as well as palpitations.   Today, he denies symptoms of chest pain, orthopnea, PND, lower extremity edema, dizziness, presyncope, syncope, snoring, daytime somnolence, bleeding, or neurologic sequela. The patient is tolerating medications without difficulties and is otherwise without complaint today.    Atrial Fibrillation Risk Factors:  he does have symptoms or diagnosis of sleep apnea. he is compliant with CPAP therapy.  he does not have a history of rheumatic fever.  he does not have a history of alcohol use.  he has a BMI of Body mass index is 26.63 kg/m.Marland Kitchen Filed Weights   09/25/18 1127  Weight: 84.2 kg      Atrial Fibrillation Management history:  Previous antiarrhythmic drugs: none  Previous cardioversions: 2018  Previous ablations: none (prior MAZE 2014)  CHADS2VASC score: 3  Anticoagulation history: Eliquis   Past Medical History:  Diagnosis Date  . Anxiety   . Atrial fibrillation ([place]) 07/06/2013  . CKD (chronic kidney disease), stage III ([place])   . Essential hypertension, benign 07/06/2013  . Kidney stones    some passed spontaneous, one retrieved by cystoscope   . 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 S/P Maze operation for atrial fibrillation 07/22/2013   Complete bilateral atrial lesion set using bipolar radiofrequency and cryothermy ablation with clipping of LA appendage   . 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 Sleep apnea    Past Surgical History:  Procedure Laterality Date  . CARDIOVERSION N/A 02/08/2017   Procedure: CARDIOVERSION;  Surgeon: Dorothy Spark, MD;  Location: [place];  Service: Cardiovascular;  Laterality: N/A;  . CORONARY ARTERY BYPASS GRAFT N/A 07/22/2013   Procedure: CORONARY ARTERY BYPASS GRAFTING (CABG);  Surgeon: Rexene Alberts, MD;  Location: [place];  Service: Open Heart Surgery;  Laterality: N/A;  . CYSTOSCOPY    . INTRAOPERATIVE TRANSESOPHAGEAL ECHOCARDIOGRAM N/A 07/22/2013   Procedure: INTRAOPERATIVE TRANSESOPHAGEAL ECHOCARDIOGRAM;  Surgeon: Rexene Alberts, MD;  Location: [place];  Service: Open Heart Surgery;  Laterality: N/A;  . LEFT AND RIGHT HEART CATHETERIZATION WITH CORONARY ANGIOGRAM  07/07/2013   Procedure: LEFT AND RIGHT HEART CATHETERIZATION WITH CORONARY ANGIOGRAM;  Surgeon: Thayer Headings, MD;  Location: [facility] CATH LAB;  Service: Cardiovascular;;  . MAZE N/A 07/22/2013   Procedure: MAZE;  Surgeon: Rexene Alberts, MD;  Location: [place];  Service: Open Heart Surgery;  Laterality: N/A;  . MITRAL VALVE REPAIR N/A 07/22/2013   Procedure: MITRAL VALVE REPAIR (MVR);  Surgeon: Rexene Alberts, MD;  Location: [place];  Service: Open Heart Surgery;  Laterality: N/A;  .  PATENT FORAMEN OVALE CLOSURE N/A 07/22/2013   Procedure: PATENT FORAMEN OVALE CLOSURE;  Surgeon: Rexene Alberts, MD;  Location: [place];  Service: Open Heart Surgery;  Laterality: N/A;  . TEE WITHOUT CARDIOVERSION N/A 07/08/2013   Procedure: TRANSESOPHAGEAL ECHOCARDIOGRAM (TEE);  Surgeon: Larey Dresser, MD;  Location: [place];  Service: Cardiovascular;  Laterality: N/A;  . TEE WITHOUT CARDIOVERSION N/A 02/08/2017   Procedure: TRANSESOPHAGEAL ECHOCARDIOGRAM (TEE);  Surgeon: Dorothy Spark, MD;  Location: [facility] ENDOSCOPY;  Service: Cardiovascular;  Laterality: N/A;  . TONSILLECTOMY      Current Outpatient Medications  Medication Sig Dispense Refill  . apixaban (ELIQUIS) 5 MG TABS tablet Take 1 tablet (5 mg total) by mouth 2 (two) times daily. 60 tablet 6  . atorvastatin (LIPITOR) 40 MG tablet TAKE ONE TABLET BY MOUTH EVERY DAY AT 6:00PM 90 tablet 1  . benazepril (LOTENSIN) 10 MG tablet TAKE 1 TABLET BY MOUTH EVERY DAY 30 tablet 6  . hydrochlorothiazide (HYDRODIURIL) 25 MG tablet TAKE ONE TABLET BY MOUTH EVERY DAY 90 tablet 1  . metoprolol tartrate (LOPRESSOR) 25 MG tablet TAKE 1 TABLET BY MOUTH TWICE DAILY 60 tablet 6  . sildenafil (REVATIO) 20 MG tablet Take 5 tablets (100 mg total) by mouth daily as needed. 60 tablet 11   No current facility-administered medications for this encounter.     Allergies  Allergen Reactions  . Shellfish Allergy Other (See Comments)    flulike symptoms   . Sulfa Antibiotics Other (See Comments)    "very sick"  . Meperidine And Related Itching    Social History   Socioeconomic History  . Marital status: Married    Spouse name: Not on file  . Number of children: Not on file  . Years of education: Not on file  . Highest education level: Not on file  Occupational History  . Occupation: Geographical information systems officer  . Financial resource strain: Not on file  . Food insecurity:    Worry: Not on file    Inability: Not on file  . Transportation needs:    Medical: Not on file    Non-medical: Not on file  Tobacco Use  . Smoking status: Never Smoker  . Smokeless tobacco: Never Used  Substance and Sexual Activity  . Alcohol use: Yes    Alcohol/week: 2.0 standard drinks    Types: 2 Standard drinks or equivalent per week  . Drug use: No  . Sexual activity: Yes  Lifestyle    . Physical activity:    Days per week: Not on file    Minutes per session: Not on file  . Stress: Not on file  Relationships  . Social connections:    Talks on phone: Not on file    Gets together: Not on file    Attends religious service: Not on file    Active member of club or organization: Not on file    Attends meetings of clubs or organizations: Not on file    Relationship status: Not on file  . Intimate partner violence:    Fear of current or ex partner: Not on file    Emotionally abused: Not on file    Physically abused: Not on file    Forced sexual activity: Not on file  Other Topics Concern  . Not on file  Social History Narrative  . Not on file    Family History  Problem Relation Age of Onset  . Heart attack Father    The patient does  not have a history of early familial atrial fibrillation or other arrhythmias.  ROS- All systems are reviewed and negative except as per the HPI above.  Physical Exam: Vitals:   09/25/18 1127  BP: (!) 136/94  Pulse: (!) 101  Weight: 84.2 kg  Height: 5\' 10"  (1.778 m)    GEN- The patient is well appearing, alert and oriented x 3 today.   Head- normocephalic, atraumatic Eyes-  Sclera clear, conjunctiva pink Ears- hearing intact Oropharynx- clear Neck- supple  Lungs- Clear to ausculation bilaterally, normal work of breathing Heart- Irregular rate and rhythm  GI- soft, NT, ND, + BS Extremities- no clubbing, cyanosis, or edema MS- no significant deformity or atrophy Skin- no rash or lesion Psych- euthymic mood, full affect Neuro- strength and sensation are intact  Wt Readings from Last 3 Encounters:  09/25/18 84.2 kg  09/10/18 82.3 kg  04/07/18 85.3 kg    EKG today demonstrates atrial fibrillation, rate 101, QTc 489msec  Epic records are reviewed at length today  Assessment and Plan:  1. Persistent atrial fibrillation The patient has symptomatic persistent atrial fibrillation. He was restarted on [facility] 9/18 and has  been compliant since with no missed doses.  We discussed treatment options at length today including repeat cardioversion off AAD therapy, AAD therapy with Tikosyn or consideration for ablation. Guidelines would recommend AAD therapy prior to consideration of ablation.  He would like to pursue cardioversion for now which I think is reasonable. Will schedule after 3 weeks of therapeutic [place].  He is not interested in daily AAD therapy. Depending on timing of recurrence of atrial fibrillation, will discuss with Dr Rayann Heman if he would be willing to consider ablation with prior history of MAZE off AAD therapy.  Risks, benefits to cardioversion reviewed with patient who wishes to proceed.  BMET, CBC today  2.  HTN BP elevated today BP at home consistently 160'V systolic No changes  3.  S/p MVR/MAZE TEE 2/18 with no significant gradient  Follow up with AF clinic 1 week after cardioversion   Chanetta Marshall, NP 09/29/2018 9:46 AM

## 2018-09-25 NOTE — Progress Notes (Signed)
Primary Care Physician: Glenda Chroman, MD Primary Cardiologist: Burt Knack Referring Physician: Morris Longenecker is a 72 y.o. male with a history of persistent atrial fibrillation who presents for consultation in the [facility].  He is s/p CABG, MVR and MAZE in 2014.  He did very well without recurrence of AF until 2018.  At that time, he underwent TEE guided cardioversion and has not had recurrent atrial fibrillation until last month.  He was seen in the office and restarted on anticoagulation with which he has been compliant.  He presents today to discuss options for treatment of atrial fibrillation.  He is symptomatic with increased fatigue and exercise intolerance as well as palpitations.   Today, he denies symptoms of chest pain, orthopnea, PND, lower extremity edema, dizziness, presyncope, syncope, snoring, daytime somnolence, bleeding, or neurologic sequela. The patient is tolerating medications without difficulties and is otherwise without complaint today.    Atrial Fibrillation Risk Factors:  he does have symptoms or diagnosis of sleep apnea. he is compliant with CPAP therapy.  he does not have a history of rheumatic fever.  he does not have a history of alcohol use.  he has a BMI of Body mass index is 26.63 kg/m.Marland Kitchen Filed Weights   09/25/18 1127  Weight: 84.2 kg      Atrial Fibrillation Management history:  Previous antiarrhythmic drugs: none  Previous cardioversions: 2018  Previous ablations: none (prior MAZE 2014)  CHADS2VASC score: 3  Anticoagulation history: Eliquis   Past Medical History:  Diagnosis Date  . Anxiety   . Atrial fibrillation ([place]) 07/06/2013  . CKD (chronic kidney disease), stage III ([place])   . Essential hypertension, benign 07/06/2013  . Kidney stones    some passed spontaneous, one retrieved by cystoscope   . 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 S/P Maze operation for atrial fibrillation 07/22/2013   Complete bilateral atrial lesion set using bipolar radiofrequency and cryothermy ablation with clipping of LA appendage   . S/P mitral valve repair 07/22/2013   Complex valvuloplasty including triangular resection of posterior leaflet with artificial Gore-tex neocord placement x2 and 60mm Sorin Memo 3D ring annuloplasty  . Sleep apnea    Past Surgical History:  Procedure Laterality Date  . CARDIOVERSION N/A 02/08/2017   Procedure: CARDIOVERSION;  Surgeon: Dorothy Spark, MD;  Location: [place];  Service: Cardiovascular;  Laterality: N/A;  . CORONARY ARTERY BYPASS GRAFT N/A 07/22/2013   Procedure: CORONARY ARTERY BYPASS GRAFTING (CABG);  Surgeon: Rexene Alberts, MD;  Location: [place];  Service: Open Heart Surgery;  Laterality: N/A;  . CYSTOSCOPY    . INTRAOPERATIVE TRANSESOPHAGEAL ECHOCARDIOGRAM N/A 07/22/2013   Procedure: INTRAOPERATIVE TRANSESOPHAGEAL ECHOCARDIOGRAM;  Surgeon: Rexene Alberts, MD;  Location: [place];  Service: Open Heart Surgery;  Laterality: N/A;  . LEFT AND RIGHT HEART CATHETERIZATION WITH CORONARY ANGIOGRAM  07/07/2013   Procedure: LEFT AND RIGHT HEART CATHETERIZATION WITH CORONARY ANGIOGRAM;  Surgeon: Thayer Headings, MD;  Location: [facility] CATH LAB;  Service: Cardiovascular;;  . MAZE N/A 07/22/2013   Procedure: MAZE;  Surgeon: Rexene Alberts, MD;  Location: [place];  Service: Open Heart Surgery;  Laterality: N/A;  . MITRAL VALVE REPAIR N/A 07/22/2013   Procedure: MITRAL VALVE REPAIR (MVR);  Surgeon: Rexene Alberts, MD;  Location: [place];  Service: Open Heart Surgery;  Laterality: N/A;  .  PATENT FORAMEN OVALE CLOSURE N/A 07/22/2013   Procedure: PATENT FORAMEN OVALE CLOSURE;  Surgeon: Rexene Alberts, MD;  Location: [place];  Service: Open Heart Surgery;  Laterality: N/A;  . TEE WITHOUT CARDIOVERSION N/A 07/08/2013   Procedure: TRANSESOPHAGEAL ECHOCARDIOGRAM (TEE);  Surgeon: Larey Dresser, MD;  Location: [place];  Service: Cardiovascular;  Laterality: N/A;  . TEE WITHOUT CARDIOVERSION N/A 02/08/2017   Procedure: TRANSESOPHAGEAL ECHOCARDIOGRAM (TEE);  Surgeon: Dorothy Spark, MD;  Location: [facility] ENDOSCOPY;  Service: Cardiovascular;  Laterality: N/A;  . TONSILLECTOMY      Current Outpatient Medications  Medication Sig Dispense Refill  . apixaban (ELIQUIS) 5 MG TABS tablet Take 1 tablet (5 mg total) by mouth 2 (two) times daily. 60 tablet 6  . atorvastatin (LIPITOR) 40 MG tablet TAKE ONE TABLET BY MOUTH EVERY DAY AT 6:00PM 90 tablet 1  . benazepril (LOTENSIN) 10 MG tablet TAKE 1 TABLET BY MOUTH EVERY DAY 30 tablet 6  . hydrochlorothiazide (HYDRODIURIL) 25 MG tablet TAKE ONE TABLET BY MOUTH EVERY DAY 90 tablet 1  . metoprolol tartrate (LOPRESSOR) 25 MG tablet TAKE 1 TABLET BY MOUTH TWICE DAILY 60 tablet 6  . sildenafil (REVATIO) 20 MG tablet Take 5 tablets (100 mg total) by mouth daily as needed. 60 tablet 11   No current facility-administered medications for this encounter.     Allergies  Allergen Reactions  . Shellfish Allergy Other (See Comments)    flulike symptoms   . Sulfa Antibiotics Other (See Comments)    "very sick"  . Meperidine And Related Itching    Social History   Socioeconomic History  . Marital status: Married    Spouse name: Not on file  . Number of children: Not on file  . Years of education: Not on file  . Highest education level: Not on file  Occupational History  . Occupation: Geographical information systems officer  . Financial resource strain: Not on file  . Food insecurity:    Worry: Not on file    Inability: Not on file  . Transportation needs:    Medical: Not on file    Non-medical: Not on file  Tobacco Use  . Smoking status: Never Smoker  . Smokeless tobacco: Never Used  Substance and Sexual Activity  . Alcohol use: Yes    Alcohol/week: 2.0 standard drinks    Types: 2 Standard drinks or equivalent per week  . Drug use: No  . Sexual activity: Yes  Lifestyle    . Physical activity:    Days per week: Not on file    Minutes per session: Not on file  . Stress: Not on file  Relationships  . Social connections:    Talks on phone: Not on file    Gets together: Not on file    Attends religious service: Not on file    Active member of club or organization: Not on file    Attends meetings of clubs or organizations: Not on file    Relationship status: Not on file  . Intimate partner violence:    Fear of current or ex partner: Not on file    Emotionally abused: Not on file    Physically abused: Not on file    Forced sexual activity: Not on file  Other Topics Concern  . Not on file  Social History Narrative  . Not on file    Family History  Problem Relation Age of Onset  . Heart attack Father    The patient does  not have a history of early familial atrial fibrillation or other arrhythmias.  ROS- All systems are reviewed and negative except as per the HPI above.  Physical Exam: Vitals:   09/25/18 1127  BP: (!) 136/94  Pulse: (!) 101  Weight: 84.2 kg  Height: 5\' 10"  (1.778 m)    GEN- The patient is well appearing, alert and oriented x 3 today.   Head- normocephalic, atraumatic Eyes-  Sclera clear, conjunctiva pink Ears- hearing intact Oropharynx- clear Neck- supple  Lungs- Clear to ausculation bilaterally, normal work of breathing Heart- Irregular rate and rhythm  GI- soft, NT, ND, + BS Extremities- no clubbing, cyanosis, or edema MS- no significant deformity or atrophy Skin- no rash or lesion Psych- euthymic mood, full affect Neuro- strength and sensation are intact  Wt Readings from Last 3 Encounters:  09/25/18 84.2 kg  09/10/18 82.3 kg  04/07/18 85.3 kg    EKG today demonstrates atrial fibrillation, rate 101, QTc 450msec  Epic records are reviewed at length today  Assessment and Plan:  1. Persistent atrial fibrillation The patient has symptomatic persistent atrial fibrillation. He was restarted on [facility] 9/18 and has  been compliant since with no missed doses.  We discussed treatment options at length today including repeat cardioversion off AAD therapy, AAD therapy with Tikosyn or consideration for ablation. Guidelines would recommend AAD therapy prior to consideration of ablation.  He would like to pursue cardioversion for now which I think is reasonable. Will schedule after 3 weeks of therapeutic [place].  He is not interested in daily AAD therapy. Depending on timing of recurrence of atrial fibrillation, will discuss with Dr Rayann Heman if he would be willing to consider ablation with prior history of MAZE off AAD therapy.  Risks, benefits to cardioversion reviewed with patient who wishes to proceed.  BMET, CBC today  2.  HTN BP elevated today BP at home consistently 740'C systolic No changes  3.  S/p MVR/MAZE TEE 2/18 with no significant gradient  Follow up with AF clinic 1 week after cardioversion   Chanetta Marshall, NP 09/29/2018 9:46 AM

## 2018-09-25 NOTE — Patient Instructions (Signed)
Cardioversion scheduled for Friday, October 18th  - Arrive at the [facility] and go to admitting at [facility] not eat or drink anything after midnight the night prior to your procedure.  - Take all your morning medications with a sip of water prior to arrival.  - You will not be able to drive home after your procedure.

## 2018-09-26 DIAGNOSIS — Z23 Encounter for immunization: Secondary | ICD-10-CM | POA: Diagnosis not present

## 2018-09-29 ENCOUNTER — Encounter (HOSPITAL_COMMUNITY): Payer: Self-pay | Admitting: Nurse Practitioner

## 2018-10-07 ENCOUNTER — Ambulatory Visit: Payer: BLUE CROSS/BLUE SHIELD | Admitting: Physician Assistant

## 2018-10-10 ENCOUNTER — Other Ambulatory Visit: Payer: Self-pay

## 2018-10-10 ENCOUNTER — Ambulatory Visit (HOSPITAL_COMMUNITY)
Admission: RE | Admit: 2018-10-10 | Discharge: 2018-10-10 | Disposition: A | Discharge status: Home / Self Care | Payer: BLUE CROSS/BLUE SHIELD | Source: Ambulatory Visit | Attending: Cardiovascular Disease | Admitting: Cardiovascular Disease

## 2018-10-10 ENCOUNTER — Encounter (HOSPITAL_COMMUNITY)
Admission: RE | Disposition: A | Discharge status: Home / Self Care | Payer: Self-pay | Source: Ambulatory Visit | Attending: Cardiovascular Disease

## 2018-10-10 ENCOUNTER — Ambulatory Visit (HOSPITAL_COMMUNITY): Payer: BLUE CROSS/BLUE SHIELD | Admitting: Anesthesiology

## 2018-10-10 DIAGNOSIS — Z885 Allergy status to narcotic agent status: Secondary | ICD-10-CM | POA: Diagnosis not present

## 2018-10-10 DIAGNOSIS — Z91013 Allergy to seafood: Secondary | ICD-10-CM | POA: Insufficient documentation

## 2018-10-10 DIAGNOSIS — G4730 Sleep apnea, unspecified: Secondary | ICD-10-CM | POA: Diagnosis not present

## 2018-10-10 DIAGNOSIS — Z882 Allergy status to sulfonamides status: Secondary | ICD-10-CM | POA: Diagnosis not present

## 2018-10-10 DIAGNOSIS — Z87442 Personal history of urinary calculi: Secondary | ICD-10-CM | POA: Insufficient documentation

## 2018-10-10 DIAGNOSIS — Z951 Presence of aortocoronary bypass graft: Secondary | ICD-10-CM | POA: Diagnosis not present

## 2018-10-10 DIAGNOSIS — Z8249 Family history of ischemic heart disease and other diseases of the circulatory system: Secondary | ICD-10-CM | POA: Insufficient documentation

## 2018-10-10 DIAGNOSIS — N183 Chronic kidney disease, stage 3 (moderate): Secondary | ICD-10-CM | POA: Insufficient documentation

## 2018-10-10 DIAGNOSIS — Z955 Presence of coronary angioplasty implant and graft: Secondary | ICD-10-CM | POA: Insufficient documentation

## 2018-10-10 DIAGNOSIS — Z7901 Long term (current) use of anticoagulants: Secondary | ICD-10-CM | POA: Diagnosis not present

## 2018-10-10 DIAGNOSIS — I129 Hypertensive chronic kidney disease with stage 1 through stage 4 chronic kidney disease, or unspecified chronic kidney disease: Secondary | ICD-10-CM | POA: Insufficient documentation

## 2018-10-10 DIAGNOSIS — I480 Paroxysmal atrial fibrillation: Secondary | ICD-10-CM | POA: Diagnosis not present

## 2018-10-10 DIAGNOSIS — E785 Hyperlipidemia, unspecified: Secondary | ICD-10-CM | POA: Diagnosis not present

## 2018-10-10 DIAGNOSIS — I4819 Other persistent atrial fibrillation: Secondary | ICD-10-CM | POA: Diagnosis not present

## 2018-10-10 DIAGNOSIS — Z79899 Other long term (current) drug therapy: Secondary | ICD-10-CM | POA: Diagnosis not present

## 2018-10-10 DIAGNOSIS — Z9889 Other specified postprocedural states: Secondary | ICD-10-CM | POA: Insufficient documentation

## 2018-10-10 HISTORY — PX: CARDIOVERSION: SHX1299

## 2018-10-10 LAB — POCT I-STAT 4, (NA,K, GLUC, HGB,HCT)
GLUCOSE: 107 mg/dL — AB (ref 70–99)
HEMATOCRIT: 43 % (ref 39.0–52.0)
Hemoglobin: 14.6 g/dL (ref 13.0–17.0)
Potassium: 4.3 mmol/L (ref 3.5–5.1)
Sodium: 140 mmol/L (ref 135–145)

## 2018-10-10 SURGERY — CARDIOVERSION
Anesthesia: General

## 2018-10-10 MED ORDER — PHENYLEPHRINE HCL 10 MG/ML IJ SOLN
INTRAMUSCULAR | Status: DC | PRN
  Administered 2018-10-10: 40 ug via INTRAVENOUS
  Administered 2018-10-10: 80 ug via INTRAVENOUS
  Administered 2018-10-10: 40 ug via INTRAVENOUS

## 2018-10-10 MED ORDER — PROPOFOL 10 MG/ML IV BOLUS
INTRAVENOUS | Status: DC | PRN
  Administered 2018-10-10: 40 mg via INTRAVENOUS
  Administered 2018-10-10: 60 mg via INTRAVENOUS

## 2018-10-10 MED ORDER — SODIUM CHLORIDE 0.9 % IV SOLN
INTRAVENOUS | Status: DC
  Administered 2018-10-10 (×2): via INTRAVENOUS

## 2018-10-10 MED ORDER — LIDOCAINE HCL (CARDIAC) PF 100 MG/5ML IV SOSY
PREFILLED_SYRINGE | INTRAVENOUS | Status: DC | PRN
  Administered 2018-10-10: 50 mg via INTRAVENOUS

## 2018-10-10 NOTE — Anesthesia Preprocedure Evaluation (Addendum)
Anesthesia Evaluation  Patient identified by MRN, date of birth, ID band Patient awake    Reviewed: Allergy & Precautions, NPO status , Patient's Chart, lab work & pertinent test results, reviewed documented beta blocker date and time   History of Anesthesia Complications Negative for: history of anesthetic complications  Airway Mallampati: III  TM Distance: >3 FB Neck ROM: Full    Dental no notable dental hx.    Pulmonary sleep apnea ,    Pulmonary exam normal        Cardiovascular hypertension, Pt. on home beta blockers and Pt. on medications + CAD and + CABG  + Valvular Problems/Murmurs (s/p mitral valve repair (2014))  Rhythm:Irregular Rate:Normal     Neuro/Psych negative neurological ROS  negative psych ROS   GI/Hepatic negative GI ROS, Neg liver ROS,   Endo/Other  negative endocrine ROS  Renal/GU Renal disease  negative genitourinary   Musculoskeletal negative musculoskeletal ROS (+)   Abdominal   Peds  Hematology negative hematology ROS (+)   Anesthesia Other Findings   Reproductive/Obstetrics                            Anesthesia Physical Anesthesia Plan  ASA: III  Anesthesia Plan: General   Post-op Pain Management:    Induction: Intravenous  PONV Risk Score and Plan: 2 and TIVA  Airway Management Planned: Mask  Additional Equipment: None  Intra-op Plan:   Post-operative Plan:   Informed Consent: I have reviewed the patients History and Physical, chart, labs and discussed the procedure including the risks, benefits and alternatives for the proposed anesthesia with the patient or authorized representative who has indicated his/her understanding and acceptance.     Plan Discussed with:   Anesthesia Plan Comments:        Anesthesia Quick Evaluation

## 2018-10-10 NOTE — Transfer of Care (Signed)
Immediate Anesthesia Transfer of Care Note  Patient: Henry Page  Procedure(s) Performed: CARDIOVERSION (N/A )  Patient Location: Endoscopy Unit  Anesthesia Type:General  Level of Consciousness: awake and alert   Airway & Oxygen Therapy: Patient Spontanous Breathing and Patient connected to nasal cannula oxygen  Post-op Assessment: Report given to RN and Post -op Vital signs reviewed and stable  Post vital signs: Reviewed and stable  Last Vitals:  Vitals Value Taken Time  BP 97/62 10/10/2018 10:58 AM  Temp 36.7 C 10/10/2018 10:58 AM  Pulse 48 10/10/2018 10:59 AM  Resp 18 10/10/2018 10:59 AM  SpO2 100 % 10/10/2018 10:59 AM    Last Pain:  Vitals:   10/10/18 1058  TempSrc: Oral  PainSc: 0-No pain         Complications: No apparent anesthesia complications

## 2018-10-10 NOTE — Op Note (Signed)
Procedure: Electrical Cardioversion Indications:  Atrial Fibrillation  Procedure Details:  Consent: Risks of procedure as well as the alternatives and risks of each were explained to the (patient/caregiver).  Consent for procedure obtained.  Time Out: Verified patient identification, verified procedure, site/side was marked, verified correct patient position, special equipment/implants available, medications/allergies/relevent history reviewed, required imaging and test results available.  Performed  Patient placed on cardiac monitor, pulse oximetry, supplemental oxygen as necessary.  Sedation given: propofol 100 mg IV, Dr. Kerin Perna Pacer pads placed anterior and posterior chest.  Cardioverted 1 time(s).  Cardioversion with synchronized biphasic 120J shock.  Evaluation: Findings: Post procedure EKG shows: sinus bradycardia Complications: brief hypotension Patient did tolerate procedure well.  Time Spent Directly with the Patient:  30 minutes   Henry Page 10/10/2018, 10:53 AM

## 2018-10-10 NOTE — Addendum Note (Signed)
Addendum  created 10/10/18 1223 by Eligha Bridegroom, CRNA   Intraprocedure Staff edited

## 2018-10-10 NOTE — Discharge Instructions (Signed)
Electrical Cardioversion, Care After °This sheet gives you information about how to care for yourself after your procedure. Your health care provider may also give you more specific instructions. If you have problems or questions, contact your health care provider. °What can I expect after the procedure? °After the procedure, it is common to have: °· Some redness on the skin where the shocks were given. ° °Follow these instructions at home: °· Do not drive for 24 hours if you were given a medicine to help you relax (sedative). °· Take over-the-counter and prescription medicines only as told by your health care provider. °· Ask your health care provider how to check your pulse. Check it often. °· Rest for 48 hours after the procedure or as told by your health care provider. °· Avoid or limit your caffeine use as told by your health care provider. °Contact a health care provider if: °· You feel like your heart is beating too quickly or your pulse is not regular. °· You have a serious muscle cramp that does not go away. °Get help right away if: °· You have discomfort in your chest. °· You are dizzy or you feel faint. °· You have trouble breathing or you are short of breath. °· Your speech is slurred. °· You have trouble moving an arm or leg on one side of your body. °· Your fingers or toes turn cold or blue. °This information is not intended to replace advice given to you by your health care provider. Make sure you discuss any questions you have with your health care provider. °Document Released: 09/30/2013 Document Revised: 07/13/2016 Document Reviewed: 06/15/2016 °Elsevier Interactive Patient Education © 2018 [employer] ° °

## 2018-10-10 NOTE — Anesthesia Postprocedure Evaluation (Signed)
Anesthesia Post Note  Patient: Henry Page  Procedure(s) Performed: CARDIOVERSION (N/A )     Patient location during evaluation: PACU Anesthesia Type: General Level of consciousness: awake and alert Pain management: pain level controlled Vital Signs Assessment: post-procedure vital signs reviewed and stable Respiratory status: spontaneous breathing, nonlabored ventilation and respiratory function stable Cardiovascular status: blood pressure returned to baseline and stable Postop Assessment: no apparent nausea or vomiting Anesthetic complications: no    Last Vitals:  Vitals:   10/10/18 1059 10/10/18 1103  BP:  (!) 87/53  Pulse: (!) 48   Resp: 18   Temp:    SpO2: 100%     Last Pain:  Vitals:   10/10/18 1058  TempSrc: Oral  PainSc: 0-No pain                 Lidia Collum

## 2018-10-10 NOTE — Interval H&P Note (Signed)
History and Physical Interval Note:  10/10/2018 10:36 AM  Henry Page  has presented today for surgery, with the diagnosis of atrial fibrillation  The various methods of treatment have been discussed with the patient and family. After consideration of risks, benefits and other options for treatment, the patient has consented to  Procedure(s): CARDIOVERSION (N/A) as a surgical intervention .  The patient's history has been reviewed, patient examined, no change in status, stable for surgery.  I have reviewed the patient's chart and labs.  Questions were answered to the patient's satisfaction.     Isra Lindy

## 2018-10-13 ENCOUNTER — Encounter (HOSPITAL_COMMUNITY): Payer: Self-pay | Admitting: Cardiovascular Disease

## 2018-10-13 NOTE — Addendum Note (Signed)
Addendum  created 10/13/18 1436 by Lidia Collum, MD   Intraprocedure Staff edited

## 2018-10-23 ENCOUNTER — Ambulatory Visit (HOSPITAL_COMMUNITY): Payer: BLUE CROSS/BLUE SHIELD | Admitting: Nurse Practitioner

## 2018-10-27 ENCOUNTER — Ambulatory Visit (HOSPITAL_COMMUNITY): Payer: BLUE CROSS/BLUE SHIELD | Admitting: Nurse Practitioner

## 2018-10-27 ENCOUNTER — Telehealth: Payer: Self-pay | Admitting: Cardiovascular Disease

## 2018-10-27 DIAGNOSIS — E782 Mixed hyperlipidemia: Secondary | ICD-10-CM

## 2018-10-27 MED ORDER — ROSUVASTATIN CALCIUM 10 MG PO TABS: 10.0000 mg | ORAL_TABLET | Freq: Every day | ORAL | 11 refills | Status: DC

## 2018-10-27 NOTE — Telephone Encounter (Signed)
Do not see that pt has tried any other statins per our records in [place]. Would see if he is willing to try rosuvastatin 10mg  daily. If he tolerates therapy well, would recommend increasing to 20mg  daily due to hx of CABG (LDL previously controlled at 31 on atorvastatin 40mg  daily).

## 2018-10-27 NOTE — Telephone Encounter (Signed)
Henry Page has not taken his atorvastatin for over two weeks. He took a holiday because he was experiencing severe muscle cramping in his legs. In the last 2 weeks, the cramping has drastically improved.   His wife understands to continue holding the Lipitor and she will be called with further medication recommendations.

## 2018-10-27 NOTE — Telephone Encounter (Signed)
Discussed with patient and his wife.  He will START CRESTOR 10 mg daily. FLP and LFTs scheduled 2/17. 3 mo follow-up scheduled 2/19 (he has been followed by [facility] and does not wish to be seen at this time when his 6 mo visit is due). He was grateful for call and agrees with treatment plan.

## 2018-10-27 NOTE — Telephone Encounter (Signed)
New Message:   Patient wife calling  Concerning some medication her husband is on that is causing him to cramping.

## 2018-10-31 ENCOUNTER — Encounter (HOSPITAL_COMMUNITY): Payer: Self-pay | Admitting: Nurse Practitioner

## 2018-10-31 ENCOUNTER — Ambulatory Visit (HOSPITAL_COMMUNITY)
Admission: RE | Admit: 2018-10-31 | Discharge: 2018-10-31 | Disposition: A | Discharge status: Home / Self Care | Payer: BLUE CROSS/BLUE SHIELD | Source: Ambulatory Visit | Attending: Nurse Practitioner | Admitting: Nurse Practitioner

## 2018-10-31 VITALS — BP 134/84 | HR 87 | Ht 70.0 in | Wt 187.0 lb

## 2018-10-31 DIAGNOSIS — N183 Chronic kidney disease, stage 3 (moderate): Secondary | ICD-10-CM | POA: Diagnosis not present

## 2018-10-31 DIAGNOSIS — Z951 Presence of aortocoronary bypass graft: Secondary | ICD-10-CM | POA: Insufficient documentation

## 2018-10-31 DIAGNOSIS — I129 Hypertensive chronic kidney disease with stage 1 through stage 4 chronic kidney disease, or unspecified chronic kidney disease: Secondary | ICD-10-CM | POA: Diagnosis not present

## 2018-10-31 DIAGNOSIS — Z885 Allergy status to narcotic agent status: Secondary | ICD-10-CM | POA: Insufficient documentation

## 2018-10-31 DIAGNOSIS — Z7901 Long term (current) use of anticoagulants: Secondary | ICD-10-CM | POA: Diagnosis not present

## 2018-10-31 DIAGNOSIS — G4730 Sleep apnea, unspecified: Secondary | ICD-10-CM | POA: Insufficient documentation

## 2018-10-31 DIAGNOSIS — I4819 Other persistent atrial fibrillation: Secondary | ICD-10-CM | POA: Diagnosis not present

## 2018-10-31 DIAGNOSIS — Z79899 Other long term (current) drug therapy: Secondary | ICD-10-CM | POA: Insufficient documentation

## 2018-10-31 DIAGNOSIS — Z91013 Allergy to seafood: Secondary | ICD-10-CM | POA: Insufficient documentation

## 2018-10-31 DIAGNOSIS — Z882 Allergy status to sulfonamides status: Secondary | ICD-10-CM | POA: Insufficient documentation

## 2018-10-31 DIAGNOSIS — Z87442 Personal history of urinary calculi: Secondary | ICD-10-CM | POA: Diagnosis not present

## 2018-10-31 DIAGNOSIS — Z8249 Family history of ischemic heart disease and other diseases of the circulatory system: Secondary | ICD-10-CM | POA: Insufficient documentation

## 2018-10-31 NOTE — Progress Notes (Addendum)
Primary Care Physician: Glenda Chroman, MD Primary Cardiologist: Burt Knack Referring Physician: [name] Tursi is a 72 y.o. male with a history of persistent atrial fibrillation who presents for f/u cardioversion in the [facility].  He is s/p CABG, MVR and MAZE in 2014.  He did very well without recurrence of AF until 2018.  At that time, he underwent TEE guided cardioversion and has not had recurrent atrial fibrillation until last month.  He was seen in the office and restarted on anticoagulation with which he has been compliant.  He had successful cardioversion but unfortunately, he is back in afib today.   Today, he denies symptoms of chest pain, orthopnea, PND, lower extremity edema, dizziness, presyncope, syncope, snoring, daytime somnolence, bleeding, or neurologic sequela. The patient is tolerating medications without difficulties and is otherwise without complaint today.    Atrial Fibrillation Risk Factors:  he does have symptoms or diagnosis of sleep apnea. he is compliant with CPAP therapy.  he does not have a history of rheumatic fever.  he does not have a history of alcohol use.  he has a BMI of Body mass index is 26.83 kg/m.Marland Kitchen Filed Weights   10/31/18 0959  Weight: 84.8 kg      Atrial Fibrillation Management history:  Previous antiarrhythmic drugs: none  Previous cardioversions: 2018  Previous ablations: none (prior MAZE 2014)  CHADS2VASC score: 3  Anticoagulation history: Eliquis   Past Medical History:  Diagnosis Date  . Anxiety   . Atrial fibrillation ([name]) 07/06/2013  . CKD (chronic kidney disease), stage III ([name])   . Essential hypertension, benign 07/06/2013  . Kidney stones    some passed spontaneous, one retrieved by cystoscope   . 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 S/P Maze operation for atrial fibrillation 07/22/2013   Complete  bilateral atrial lesion set using bipolar radiofrequency and cryothermy ablation with clipping of LA appendage   . S/P mitral valve repair 07/22/2013   Complex valvuloplasty including triangular resection of posterior leaflet with artificial Gore-tex neocord placement x2 and 54mm Sorin Memo 3D ring annuloplasty  . Sleep apnea    Past Surgical History:  Procedure Laterality Date  . CARDIOVERSION N/A 02/08/2017   Procedure: CARDIOVERSION;  Surgeon: Dorothy Spark, MD;  Location: [facility] ENDOSCOPY;  Service: Cardiovascular;  Laterality: N/A;  . CARDIOVERSION N/A 10/10/2018   Procedure: CARDIOVERSION;  Surgeon: Sanda Klein, MD;  Location: [facility] ENDOSCOPY;  Service: Cardiovascular;  Laterality: N/A;  . CORONARY ARTERY BYPASS GRAFT N/A 07/22/2013   Procedure: CORONARY ARTERY BYPASS GRAFTING (CABG);  Surgeon: Rexene Alberts, MD;  Location: [place];  Service: Open Heart Surgery;  Laterality: N/A;  . CYSTOSCOPY    . INTRAOPERATIVE TRANSESOPHAGEAL ECHOCARDIOGRAM N/A 07/22/2013   Procedure: INTRAOPERATIVE TRANSESOPHAGEAL ECHOCARDIOGRAM;  Surgeon: Rexene Alberts, MD;  Location: [place];  Service: Open Heart Surgery;  Laterality: N/A;  . LEFT AND RIGHT HEART CATHETERIZATION WITH CORONARY ANGIOGRAM  07/07/2013   Procedure: LEFT AND RIGHT HEART CATHETERIZATION WITH CORONARY ANGIOGRAM;  Surgeon: Thayer Headings, MD;  Location: [facility] CATH LAB;  Service: Cardiovascular;;  . MAZE N/A 07/22/2013   Procedure: MAZE;  Surgeon: Rexene Alberts, MD;  Location: [place];  Service: Open Heart Surgery;  Laterality: N/A;  . MITRAL VALVE REPAIR N/A 07/22/2013   Procedure: MITRAL VALVE REPAIR (MVR);  Surgeon: Rexene Alberts, MD;  Location: [facility] OR;  Service: Open Heart Surgery;  Laterality: N/A;  . PATENT FORAMEN OVALE CLOSURE N/A 07/22/2013   Procedure: PATENT FORAMEN OVALE CLOSURE;  Surgeon: Rexene Alberts, MD;  Location: [place];  Service: Open Heart Surgery;  Laterality: N/A;  . TEE WITHOUT CARDIOVERSION N/A 07/08/2013   Procedure:  TRANSESOPHAGEAL ECHOCARDIOGRAM (TEE);  Surgeon: Larey Dresser, MD;  Location: [place];  Service: Cardiovascular;  Laterality: N/A;  . TEE WITHOUT CARDIOVERSION N/A 02/08/2017   Procedure: TRANSESOPHAGEAL ECHOCARDIOGRAM (TEE);  Surgeon: Dorothy Spark, MD;  Location: [facility] ENDOSCOPY;  Service: Cardiovascular;  Laterality: N/A;  . TONSILLECTOMY      Current Outpatient Medications  Medication Sig Dispense Refill  . apixaban (ELIQUIS) 5 MG TABS tablet Take 1 tablet (5 mg total) by mouth 2 (two) times daily. 60 tablet 6  . benazepril (LOTENSIN) 10 MG tablet TAKE 1 TABLET BY MOUTH EVERY DAY 30 tablet 6  . cetirizine (ZYRTEC) 10 MG tablet Take 10 mg by mouth daily as needed for allergies.    . hydrochlorothiazide (HYDRODIURIL) 25 MG tablet TAKE ONE TABLET BY MOUTH EVERY DAY 90 tablet 1  . ibuprofen (ADVIL,MOTRIN) 200 MG tablet Take 400 mg by mouth daily as needed for pain or fever.    . metoprolol tartrate (LOPRESSOR) 25 MG tablet TAKE 1 TABLET BY MOUTH TWICE DAILY (Patient taking differently: Take 25 mg by mouth 2 (two) times daily. ) 60 tablet 6  . rosuvastatin (CRESTOR) 10 MG tablet Take 1 tablet (10 mg total) by mouth daily. 30 tablet 11  . sildenafil (REVATIO) 20 MG tablet Take 5 tablets (100 mg total) by mouth daily as needed. 60 tablet 11   No current facility-administered medications for this encounter.     Allergies  Allergen Reactions  . Shellfish Allergy Other (See Comments)    flulike symptoms   . Sulfa Antibiotics Other (See Comments)    "very sick"  . Meperidine And Related Itching    Social History   Socioeconomic History  . Marital status: Married    Spouse name: Not on file  . Number of children: Not on file  . Years of education: Not on file  . Highest education level: Not on file  Occupational History  . Occupation: Geographical information systems officer  . Financial resource strain: Not on file  . Food insecurity:    Worry: Not on file    Inability: Not on file  .  Transportation needs:    Medical: Not on file    Non-medical: Not on file  Tobacco Use  . Smoking status: Never Smoker  . Smokeless tobacco: Never Used  Substance and Sexual Activity  . Alcohol use: Yes    Alcohol/week: 2.0 standard drinks    Types: 2 Standard drinks or equivalent per week  . Drug use: No  . Sexual activity: Yes  Lifestyle  . Physical activity:    Days per week: Not on file    Minutes per session: Not on file  . Stress: Not on file  Relationships  . Social connections:    Talks on phone: Not on file    Gets together: Not on file    Attends religious service: Not on file    Active member of club or organization: Not on file    Attends meetings of clubs or organizations: Not on file    Relationship status: Not on file  . Intimate partner violence:    Fear of current or ex partner: Not on file  Emotionally abused: Not on file    Physically abused: Not on file    Forced sexual activity: Not on file  Other Topics Concern  . Not on file  Social History Narrative  . Not on file    Family History  Problem Relation Age of Onset  . Heart attack Father    The patient does not have a history of early familial atrial fibrillation or other arrhythmias.  ROS- All systems are reviewed and negative except as per the HPI above.  Physical Exam: Vitals:   10/31/18 0959  BP: 134/84  Pulse: 87  Weight: 84.8 kg  Height: 5\' 10"  (1.778 m)    GEN- The patient is well appearing, alert and oriented x 3 today.   Head- normocephalic, atraumatic Eyes-  Sclera clear, conjunctiva pink Ears- hearing intact Oropharynx- clear Neck- supple  Lungs- Clear to ausculation bilaterally, normal work of breathing Heart- Irregular rate and rhythm  GI- soft, NT, ND, + BS Extremities- no clubbing, cyanosis, or edema MS- no significant deformity or atrophy Skin- no rash or lesion Psych- euthymic mood, full affect Neuro- strength and sensation are intact  Wt Readings from Last 3  Encounters:  10/31/18 84.8 kg  10/10/18 81.6 kg  09/25/18 84.2 kg    EKG today demonstrates atrial fibrillation, rate 87 bpm, QTc 423 msec  Epic records are reviewed at length today  Assessment and Plan:  1. Persistent atrial fibrillation The patient has minimally symptomatic persistent atrial fibrillation. He was restarted on [facility] 9/18 and has been compliant since with no missed doses. Successful cardioversion but ERAF .We discussed treatment options at length today to restore SR, including  AAD therapy  or consideration for ablation, rate control. Guidelines would recommend AAD therapy prior to consideration of ablation. I think his options are amiodarone or more likely Tikosyn, detailed info given for each  drug.  He is not interested in daily AAD therapy at this time, would like an ablation if Dr. Rayann Heman would approve without AAD's. He also may try living in afib for a few months.  EF has been normal on prior echo's.  2.  HTN stable  3.  S/p MVR/MAZE TEE 2/18 with no significant gradient  Follow up with Dr. Burt Knack as scheduled 2/19 He will contact me if he has a change iof mind re AAD's and I will discuss the possibility of ablation with Dr. Rayann Heman and talk to him after that discussion  Addendum: 11/11, I discussed with Dr. Rayann Heman and he said it was not unreasonable to consider a primary ablation but he thought tikosyn might be the best approach. I discussed with the pt and he states his afib is well rate controlled, he is not feeling  bad with it and for now, he wanted to wait and see how he feels with he sees Dr. Burt Knack first of February. If he does get an appointment with Dr. Rayann Heman for ablation, he will need an updated echo.  Henry Page, [facility] [address] [place], [place] [address] [phone]

## 2018-10-31 NOTE — Addendum Note (Signed)
Encounter addended by: Sherran Needs, NP on: 10/31/2018 2:34 PM  Actions taken: Sign clinical note

## 2018-11-03 ENCOUNTER — Ambulatory Visit (HOSPITAL_COMMUNITY): Payer: BLUE CROSS/BLUE SHIELD | Admitting: Nurse Practitioner

## 2018-11-03 NOTE — Addendum Note (Signed)
Encounter addended by: Sherran Needs, NP on: 11/03/2018 1:29 PM  Actions taken: Sign clinical note

## 2018-11-16 ENCOUNTER — Other Ambulatory Visit: Payer: Self-pay | Admitting: Cardiovascular Disease

## 2018-12-17 ENCOUNTER — Other Ambulatory Visit: Payer: Self-pay | Admitting: Cardiovascular Disease

## 2019-02-09 ENCOUNTER — Other Ambulatory Visit: Payer: BLUE CROSS/BLUE SHIELD | Admitting: *Deleted

## 2019-02-09 ENCOUNTER — Encounter (INDEPENDENT_AMBULATORY_CARE_PROVIDER_SITE_OTHER): Payer: Self-pay

## 2019-02-09 DIAGNOSIS — E782 Mixed hyperlipidemia: Secondary | ICD-10-CM | POA: Diagnosis not present

## 2019-02-09 LAB — HEPATIC FUNCTION PANEL
ALBUMIN: 4.1 g/dL (ref 3.7–4.7)
ALK PHOS: 76 IU/L (ref 39–117)
ALT: 18 IU/L (ref 0–44)
AST: 21 IU/L (ref 0–40)
BILIRUBIN TOTAL: 0.5 mg/dL (ref 0.0–1.2)
BILIRUBIN, DIRECT: 0.12 mg/dL (ref 0.00–0.40)
Total Protein: 6.8 g/dL (ref 6.0–8.5)

## 2019-02-09 LAB — LIPID PANEL
CHOL/HDL RATIO: 4.3 ratio (ref 0.0–5.0)
Cholesterol, Total: 136 mg/dL (ref 100–199)
HDL: 32 mg/dL — ABNORMAL LOW (ref >39)
LDL Calculated: 48 mg/dL (ref 0–99)
Triglycerides: 279 mg/dL — ABNORMAL HIGH (ref 0–149)
VLDL Cholesterol Cal: 56 mg/dL — ABNORMAL HIGH (ref 5–40)

## 2019-02-11 ENCOUNTER — Encounter: Payer: Self-pay | Admitting: Cardiovascular Disease

## 2019-02-11 ENCOUNTER — Ambulatory Visit (INDEPENDENT_AMBULATORY_CARE_PROVIDER_SITE_OTHER): Payer: BLUE CROSS/BLUE SHIELD | Admitting: Cardiovascular Disease

## 2019-02-11 VITALS — BP 150/100 | HR 84 | Ht 70.0 in | Wt 187.6 lb

## 2019-02-11 DIAGNOSIS — I10 Essential (primary) hypertension: Secondary | ICD-10-CM

## 2019-02-11 DIAGNOSIS — Z9889 Other specified postprocedural states: Secondary | ICD-10-CM

## 2019-02-11 DIAGNOSIS — I2510 Atherosclerotic heart disease of native coronary artery without angina pectoris: Secondary | ICD-10-CM

## 2019-02-11 DIAGNOSIS — E782 Mixed hyperlipidemia: Secondary | ICD-10-CM

## 2019-02-11 DIAGNOSIS — I4819 Other persistent atrial fibrillation: Secondary | ICD-10-CM | POA: Diagnosis not present

## 2019-02-11 NOTE — Progress Notes (Signed)
Cardiology Office Note:    Date:  02/11/2019   ID:  Henry Page, DOB 10/25/1947, MRN [id number]  PCP:  Glenda Chroman, MD  Cardiologist:  Sherren Mocha, MD  Electrophysiologist:  None   Referring MD: Glenda Chroman, MD   Chief Complaint  Patient presents with  . Atrial Fibrillation    History of Present Illness:    Henry Page is a 73 y.o. male with a hx of mitral valve disease, coronary disease, and paroxysmal atrial fibrillation.  The patient underwent CABG, mitral valve repair, and maze procedure in 2014.  He developed recurrent atrial fibrillation in 2018 and underwent TEE guided cardioversion at that time.  He reverted to atrial fibrillation and was last seen in the atrial fibrillation clinic in November 2019.  His case was reviewed and he preferred not to take antiarrhythmic drug therapy.  Ablation was considered but the patient opted to continue with a strategy of rate control and anticoagulation for the time being as he wanted to discuss further at today's visit.  He's here with his wife today. He has been monitoring his BP closely and it runs in an ideal range most of the time with systolic BP less than 759 mmHg. He has been monitoring his heart rate and it is running in the 55-80 bpm range.  The patient feels well.  He denies chest pain, shortness of breath, heart palpitations, leg swelling, orthopnea, or PND.  He has had no lightheadedness or syncope.  He has not been physically active in recent months.  He notes that he is gained some weight.  Past Medical History:  Diagnosis Date  . Anxiety   . Atrial fibrillation ([place]) 07/06/2013  . CKD (chronic kidney disease), stage III ([place])   . Essential hypertension, benign 07/06/2013  . Kidney stones    some passed spontaneous, one retrieved by cystoscope   . 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 S/P Maze operation for atrial fibrillation 07/22/2013   Complete bilateral atrial lesion set using bipolar radiofrequency and cryothermy ablation with clipping of LA appendage   . S/P mitral valve repair 07/22/2013   Complex valvuloplasty including triangular resection of posterior leaflet with artificial Gore-tex neocord placement x2 and 41mm Sorin Memo 3D ring annuloplasty  . Sleep apnea     Past Surgical History:  Procedure Laterality Date  . CARDIOVERSION N/A 02/08/2017   Procedure: CARDIOVERSION;  Surgeon: Dorothy Spark, MD;  Location: [facility] ENDOSCOPY;  Service: Cardiovascular;  Laterality: N/A;  . CARDIOVERSION N/A 10/10/2018   Procedure: CARDIOVERSION;  Surgeon: Sanda Klein, MD;  Location: [facility] ENDOSCOPY;  Service: Cardiovascular;  Laterality: N/A;  . CORONARY ARTERY BYPASS GRAFT N/A 07/22/2013   Procedure: CORONARY ARTERY BYPASS GRAFTING (CABG);  Surgeon: Rexene Alberts, MD;  Location: [place];  Service: Open Heart Surgery;  Laterality: N/A;  . CYSTOSCOPY    . INTRAOPERATIVE TRANSESOPHAGEAL ECHOCARDIOGRAM N/A 07/22/2013   Procedure: INTRAOPERATIVE TRANSESOPHAGEAL ECHOCARDIOGRAM;  Surgeon: Rexene Alberts, MD;  Location: [place];  Service: Open Heart Surgery;  Laterality: N/A;  . LEFT AND RIGHT HEART CATHETERIZATION WITH CORONARY ANGIOGRAM  07/07/2013   Procedure: LEFT AND RIGHT HEART CATHETERIZATION WITH CORONARY ANGIOGRAM;  Surgeon: Thayer Headings, MD;  Location: [facility] CATH LAB;  Service: Cardiovascular;;  . MAZE N/A 07/22/2013   Procedure: MAZE;  Surgeon: Rexene Alberts, MD;  Location: [place];  Service: Open Heart  Surgery;  Laterality: N/A;  . MITRAL VALVE REPAIR N/A 07/22/2013   Procedure: MITRAL VALVE REPAIR (MVR);  Surgeon: Rexene Alberts, MD;  Location: [place];  Service: Open Heart Surgery;  Laterality: N/A;  . PATENT FORAMEN OVALE CLOSURE N/A 07/22/2013   Procedure: PATENT FORAMEN OVALE CLOSURE;  Surgeon: Rexene Alberts, MD;  Location: [place];  Service: Open Heart Surgery;  Laterality: N/A;  . TEE WITHOUT CARDIOVERSION N/A 07/08/2013    Procedure: TRANSESOPHAGEAL ECHOCARDIOGRAM (TEE);  Surgeon: Larey Dresser, MD;  Location: [place];  Service: Cardiovascular;  Laterality: N/A;  . TEE WITHOUT CARDIOVERSION N/A 02/08/2017   Procedure: TRANSESOPHAGEAL ECHOCARDIOGRAM (TEE);  Surgeon: Dorothy Spark, MD;  Location: [facility] ENDOSCOPY;  Service: Cardiovascular;  Laterality: N/A;  . TONSILLECTOMY      Current Medications: Current Meds  Medication Sig  . amoxicillin (AMOXIL) 500 MG capsule TAKE FOUR CAPSULES BY MOUTH ONE HOUR PRIOR TO ALL DENTAL APPOINTMENTS  . apixaban (ELIQUIS) 5 MG TABS tablet Take 1 tablet (5 mg total) by mouth 2 (two) times daily.  . benazepril (LOTENSIN) 10 MG tablet TAKE 1 TABLET BY MOUTH EVERY DAY  . cetirizine (ZYRTEC) 10 MG tablet Take 10 mg by mouth daily as needed for allergies.  . hydrochlorothiazide (HYDRODIURIL) 25 MG tablet TAKE 1 TABLET BY MOUTH EVERY DAY  . ibuprofen (ADVIL,MOTRIN) 200 MG tablet Take 400 mg by mouth daily as needed for pain or fever.  . metoprolol tartrate (LOPRESSOR) 25 MG tablet TAKE 1 TABLET BY MOUTH TWICE DAILY  . rosuvastatin (CRESTOR) 10 MG tablet Take 1 tablet (10 mg total) by mouth daily.  . sildenafil (REVATIO) 20 MG tablet Take 5 tablets (100 mg total) by mouth daily as needed.     Allergies:   Shellfish allergy; Sulfa antibiotics; and Meperidine and related   Social History   Socioeconomic History  . Marital status: Married    Spouse name: Not on file  . Number of children: Not on file  . Years of education: Not on file  . Highest education level: Not on file  Occupational History  . Occupation: Geographical information systems officer  . Financial resource strain: Not on file  . Food insecurity:    Worry: Not on file    Inability: Not on file  . Transportation needs:    Medical: Not on file    Non-medical: Not on file  Tobacco Use  . Smoking status: Never Smoker  . Smokeless tobacco: Never Used  Substance and Sexual Activity  . Alcohol use: Yes    Alcohol/week: 2.0  standard drinks    Types: 2 Standard drinks or equivalent per week  . Drug use: No  . Sexual activity: Yes  Lifestyle  . Physical activity:    Days per week: Not on file    Minutes per session: Not on file  . Stress: Not on file  Relationships  . Social connections:    Talks on phone: Not on file    Gets together: Not on file    Attends religious service: Not on file    Active member of club or organization: Not on file    Attends meetings of clubs or organizations: Not on file    Relationship status: Not on file  Other Topics Concern  . Not on file  Social History Narrative  . Not on file     Family History: The patient's family history includes Heart attack in his father.  ROS:   Please see the history of  present illness.    All other systems reviewed and are negative.  EKGs/Labs/Other Studies Reviewed:    EKG:  EKG is not ordered today.  Recent Labs: 09/25/2018: BUN 21; Creatinine, Ser 1.38; Platelets 248 10/10/2018: Hemoglobin 14.6; Potassium 4.3; Sodium 140 02/09/2019: ALT 18  Recent Lipid Panel    Component Value Date/Time   CHOL 136 02/09/2019 0940   TRIG 279 (H) 02/09/2019 0940   HDL 32 (L) 02/09/2019 0940   CHOLHDL 4.3 02/09/2019 0940   CHOLHDL 3.4 06/05/2016 1048   VLDL 50 (H) 06/05/2016 1048   LDLCALC 48 02/09/2019 0940   LDLDIRECT 42.0 04/26/2015 0950    Physical Exam:    VS:  BP (!) 150/100   Pulse 84   Ht 5\' 10"  (1.778 m)   Wt 187 lb 9.6 oz (85.1 kg)   SpO2 97%   BMI 26.92 kg/m     Wt Readings from Last 3 Encounters:  02/11/19 187 lb 9.6 oz (85.1 kg)  10/31/18 187 lb (84.8 kg)  10/10/18 180 lb (81.6 kg)     GEN:  Well nourished, well developed in no acute distress HEENT: Normal NECK: No JVD; No carotid bruits LYMPHATICS: No lymphadenopathy CARDIAC: Irregularly irregular with no murmurs or gallops RESPIRATORY:  Clear to auscultation without rales, wheezing or rhonchi  ABDOMEN: Soft, non-tender, non-distended MUSCULOSKELETAL:  No  edema; No deformity  SKIN: Warm and dry NEUROLOGIC:  Alert and oriented x 3 PSYCHIATRIC:  Normal affect   ASSESSMENT:    1. Persistent atrial fibrillation   2. Essential hypertension, benign   3. Coronary artery disease involving native coronary artery of native heart without angina pectoris   4. S/P mitral valve repair   5. Mixed hyperlipidemia    PLAN:    In order of problems listed above:  1. The patient remains in atrial fibrillation.  He is tolerating anticoagulation with apixaban.  He has undergone maze surgery and left atrial appendage clipping at the time of his mitral valve repair.  We discussed treatment strategies at length.  He has not interested in antiarrhythmic drug therapy secondary to potential side effects.  He is willing to consider ablation, but would like to think this over a bit more before a formal referral was made.  I am going to check an echocardiogram to assess left atrial size and LV function.  We will touch base after his echo is completed.  I have asked him to increase his activity level to better assess for any potential symptoms related to atrial fibrillation.  His wife feels like he has been more fatigued over recent months. 2. Blood pressure is well controlled at home.  He has a long history of severe whitecoat hypertension.  We will continue benazepril, hydrochlorothiazide, and metoprolol tartrate. 3. The patient is stable without symptoms of angina.  He is treated with a beta-blocker and statin drug. 4. Mitral valve repair has been intact by past echo.  Plan to update echo as above. 5. Lipids are reviewed with LDL cholesterol at goal.  Triglycerides are elevated and we discussed lifestyle modification at length.  He is having some trouble with myalgias that he relates to his statin drug.  He will try coenzyme Q 10.   Medication Adjustments/Labs and Tests Ordered: Current medicines are reviewed at length with the patient today.  Concerns regarding medicines  are outlined above.  Orders Placed This Encounter  Procedures  . ECHOCARDIOGRAM COMPLETE   No orders of the defined types were placed in this encounter.  Patient Instructions  Medication Instructions:  Your provider recommends that you continue on your current medications as directed. Please refer to the Current Medication list given to you today.    Labwork: None  Testing/Procedures: Your provider has requested that you have an echocardiogram. Echocardiography is a painless test that uses sound waves to create images of your heart. It provides your doctor with information about the size and shape of your heart and how well your heart's chambers and valves are working. This procedure takes approximately one hour. There are no restrictions for this procedure. You are scheduled for your echocardiogram on 02/18/2019. Please arrive by 10:00AM.  Follow-Up: Your provider wants you to follow-up in: 6 months with Dr. Burt Knack. You will receive a reminder letter in the mail two months in advance. If you don't receive a letter, please call our office to schedule the follow-up appointment.      Signed, Sherren Mocha, MD  02/11/2019 1:17 PM    [place]

## 2019-02-11 NOTE — Patient Instructions (Addendum)
Medication Instructions:  Your provider recommends that you continue on your current medications as directed. Please refer to the Current Medication list given to you today.    Labwork: None  Testing/Procedures: Your provider has requested that you have an echocardiogram. Echocardiography is a painless test that uses sound waves to create images of your heart. It provides your doctor with information about the size and shape of your heart and how well your heart's chambers and valves are working. This procedure takes approximately one hour. There are no restrictions for this procedure. You are scheduled for your echocardiogram on 02/18/2019. Please arrive by 10:00AM.  Follow-Up: Your provider wants you to follow-up in: 6 months with Dr. Burt Knack. You will receive a reminder letter in the mail two months in advance. If you don't receive a letter, please call our office to schedule the follow-up appointment.

## 2019-02-18 ENCOUNTER — Ambulatory Visit (HOSPITAL_COMMUNITY): Payer: BLUE CROSS/BLUE SHIELD | Attending: Internal Medicine

## 2019-02-18 DIAGNOSIS — I4819 Other persistent atrial fibrillation: Secondary | ICD-10-CM | POA: Insufficient documentation

## 2019-02-25 ENCOUNTER — Telehealth: Payer: Self-pay | Admitting: Cardiovascular Disease

## 2019-02-25 DIAGNOSIS — I4819 Other persistent atrial fibrillation: Secondary | ICD-10-CM

## 2019-02-25 NOTE — Telephone Encounter (Signed)
Reviewed results with patient's wife (DPR) who verbalized understanding.   The patient would like to proceed with ablation. Per Dr. Burt Knack, referral to Dr. Rayann Heman placed.

## 2019-02-25 NOTE — Telephone Encounter (Signed)
New Message:    Patient wife calling concerning patient results for a ECHO. Please call patient wife.

## 2019-02-25 NOTE — Telephone Encounter (Signed)
-----   Message from Sherren Mocha, MD sent at 02/19/2019 10:27 PM EST ----- The patient's echo study looks good.  LV and RV function are normal.  The mitral ring is intact with no mitral regurgitation.  Left atrial size is only mildly dilated.  Right atrial size is normal.  Findings suggest patient might be a good candidate for radiofrequency ablation if he desires to proceed with the treatment strategy of rhythm control.

## 2019-03-02 NOTE — Telephone Encounter (Signed)
Scheduled the patient with Dr. Rayann Heman 3/25. He understands the scheduler will call him if an earlier appointment becomes available. He was grateful for assistance.

## 2019-03-08 ENCOUNTER — Other Ambulatory Visit: Payer: Self-pay | Admitting: Physician Assistant

## 2019-03-09 ENCOUNTER — Telehealth: Payer: Self-pay | Admitting: Cardiovascular Disease

## 2019-03-09 NOTE — Telephone Encounter (Signed)
Returned call to family.  Advised at this time with the uncertainty of the coronavirus we are not moving appointments earlier, and unclear about elective procedures.  Per Pt he is not in distress, heart rate is running in the 70-80's.  His normal is 50's.  Advised if he had any symptoms to call afib clinic.  Family indicates understanding.

## 2019-03-09 NOTE — Telephone Encounter (Signed)
New Message:     Wife called and wanted Dr Antionette Char nurse to know if pt can be scheduled for the Ablation this week please.

## 2019-03-18 ENCOUNTER — Institutional Professional Consult (permissible substitution): Payer: BLUE CROSS/BLUE SHIELD | Admitting: Internal Medicine

## 2019-03-31 ENCOUNTER — Telehealth: Payer: Self-pay

## 2019-03-31 NOTE — Telephone Encounter (Signed)
Spoke with pt regarding appt on 04/01/19. Pt was advise to check BP, pulse, and weight day of appt. Pt wife stated she will upload a copy of the pt EKG to MyChart. Pt questions and concerns were address.

## 2019-04-01 ENCOUNTER — Telehealth (INDEPENDENT_AMBULATORY_CARE_PROVIDER_SITE_OTHER): Payer: BLUE CROSS/BLUE SHIELD | Admitting: Internal Medicine

## 2019-04-01 ENCOUNTER — Encounter: Payer: Self-pay | Admitting: Internal Medicine

## 2019-04-01 VITALS — BP 117/63 | HR 67 | Wt 179.0 lb

## 2019-04-01 DIAGNOSIS — Z9889 Other specified postprocedural states: Secondary | ICD-10-CM | POA: Diagnosis not present

## 2019-04-01 DIAGNOSIS — I4819 Other persistent atrial fibrillation: Secondary | ICD-10-CM | POA: Diagnosis not present

## 2019-04-01 DIAGNOSIS — Z8679 Personal history of other diseases of the circulatory system: Secondary | ICD-10-CM

## 2019-04-01 DIAGNOSIS — I2510 Atherosclerotic heart disease of native coronary artery without angina pectoris: Secondary | ICD-10-CM | POA: Diagnosis not present

## 2019-04-01 DIAGNOSIS — I10 Essential (primary) hypertension: Secondary | ICD-10-CM | POA: Diagnosis not present

## 2019-04-01 NOTE — Progress Notes (Signed)
Electrophysiology TeleHealth Note   Due to national recommendations of social distancing due to [place] 19, Audio/video telehealth visit is felt to be most appropriate for this patient at this time.  See MyChart message from today for patient consent regarding telehealth for [facility].   Date:  04/01/2019   ID:  Henry Page, DOB 10/25/1947, MRN [id number]  Location: home  Provider location: [address] [place] Evaluation Performed: New patient consult  PCP:  Glenda Chroman, MD  Cardiologist:  Sherren Mocha, MD  Electrophysiologist:  None   Chief Complaint:  afib  History of Present Illness:    Henry Page is a 73 y.o. male who presents via audio/video conferencing for a telehealth visit today.   The patient is referred for new consultation regarding afib by Dr Burt Knack and Roderic Palau NP.   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  He also had LAA clipped. He did well until about 2 years ago when he developed afib in the setting of influenza.  He underwent cardioversion and did well until the past few months.  When he presented to the AF clinic 10/31/2018 (note reviewed), he was noted be in rate controlled afib.  He reports that his heart rates are mostly 50s-70s.  He is minimally symptomatic.  He has previously declined AADs.  He is able to do yard work without limitation. He is sometimes tired in the afternoon.  He snores.  He has not had a sleep study. Today, he denies symptoms of palpitations, chest pain, shortness of breath, orthopnea, PND, lower extremity edema, claudication,  presyncope, syncope, bleeding, or neurologic sequela. The patient is tolerating medications without difficulties and is otherwise without complaint today.  He has chronic, stable postural dizziness, which was also present in sinus rhythm.   he denies symptoms of cough, fevers, chills, or new SOB worrisome for COVID 19.   Past Medical History:   Diagnosis Date  . Anxiety   . Atrial fibrillation ([place]) 07/06/2013  . CKD (chronic kidney disease), stage III ([place])   . Essential hypertension, benign 07/06/2013  . Kidney stones    some passed spontaneous, one retrieved by cystoscope   . 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 S/P Maze operation for atrial fibrillation 07/22/2013   Complete bilateral atrial lesion set using bipolar radiofrequency and cryothermy ablation with clipping of LA appendage   . 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 Sleep apnea     Past Surgical History:  Procedure Laterality Date  . CARDIOVERSION N/A 02/08/2017   Procedure: CARDIOVERSION;  Surgeon: Dorothy Spark, MD;  Location: [facility] ENDOSCOPY;  Service: Cardiovascular;  Laterality: N/A;  . CARDIOVERSION N/A 10/10/2018   Procedure: CARDIOVERSION;  Surgeon: Sanda Klein, MD;  Location: [facility] ENDOSCOPY;  Service: Cardiovascular;  Laterality: N/A;  . CORONARY ARTERY BYPASS GRAFT N/A 07/22/2013   Procedure: CORONARY ARTERY BYPASS GRAFTING (CABG);  Surgeon: Rexene Alberts, MD;  Location: [place];  Service: Open Heart Surgery;  Laterality: N/A;  . CYSTOSCOPY    . INTRAOPERATIVE TRANSESOPHAGEAL ECHOCARDIOGRAM N/A 07/22/2013   Procedure: INTRAOPERATIVE TRANSESOPHAGEAL ECHOCARDIOGRAM;  Surgeon: Rexene Alberts, MD;  Location: [place];  Service: Open Heart Surgery;  Laterality:  N/A;  . LEFT AND RIGHT HEART CATHETERIZATION WITH CORONARY ANGIOGRAM  07/07/2013   Procedure: LEFT AND RIGHT HEART CATHETERIZATION WITH CORONARY ANGIOGRAM;  Surgeon: Thayer Headings, MD;  Location: [facility] CATH LAB;  Service: Cardiovascular;;  . MAZE N/A 07/22/2013   Procedure: MAZE;  Surgeon: Rexene Alberts, MD;  Location: [place];  Service: Open Heart Surgery;  Laterality: N/A;   . MITRAL VALVE REPAIR N/A 07/22/2013   Procedure: MITRAL VALVE REPAIR (MVR);  Surgeon: Rexene Alberts, MD;  Location: [place];  Service: Open Heart Surgery;  Laterality: N/A;  . PATENT FORAMEN OVALE CLOSURE N/A 07/22/2013   Procedure: PATENT FORAMEN OVALE CLOSURE;  Surgeon: Rexene Alberts, MD;  Location: [place];  Service: Open Heart Surgery;  Laterality: N/A;  . TEE WITHOUT CARDIOVERSION N/A 07/08/2013   Procedure: TRANSESOPHAGEAL ECHOCARDIOGRAM (TEE);  Surgeon: Larey Dresser, MD;  Location: [place];  Service: Cardiovascular;  Laterality: N/A;  . TEE WITHOUT CARDIOVERSION N/A 02/08/2017   Procedure: TRANSESOPHAGEAL ECHOCARDIOGRAM (TEE);  Surgeon: Dorothy Spark, MD;  Location: [facility] ENDOSCOPY;  Service: Cardiovascular;  Laterality: N/A;  . TONSILLECTOMY      Current Outpatient Medications  Medication Sig Dispense Refill  . amoxicillin (AMOXIL) 500 MG capsule TAKE FOUR CAPSULES BY MOUTH ONE HOUR PRIOR TO ALL DENTAL APPOINTMENTS    . benazepril (LOTENSIN) 10 MG tablet TAKE 1 TABLET BY MOUTH EVERY DAY 30 tablet 8  . cetirizine (ZYRTEC) 10 MG tablet Take 10 mg by mouth daily as needed for allergies.    Marland Kitchen ELIQUIS 5 MG TABS tablet TAKE 1 TABLET BY MOUTH TWICE DAILY 60 tablet 6  . hydrochlorothiazide (HYDRODIURIL) 25 MG tablet TAKE 1 TABLET BY MOUTH EVERY DAY 90 tablet 3  . ibuprofen (ADVIL,MOTRIN) 200 MG tablet Take 400 mg by mouth daily as needed for pain or fever.    . metoprolol tartrate (LOPRESSOR) 25 MG tablet TAKE 1 TABLET BY MOUTH TWICE DAILY 60 tablet 8  . rosuvastatin (CRESTOR) 10 MG tablet Take 1 tablet (10 mg total) by mouth daily. 30 tablet 11  . sildenafil (REVATIO) 20 MG tablet Take 5 tablets (100 mg total) by mouth daily as needed. 60 tablet 11   No current facility-administered medications for this visit.     Allergies:   Shellfish allergy; Sulfa antibiotics; and Meperidine and related   Social History:  The patient  reports that he has never smoked. He has never used  smokeless tobacco. He reports current alcohol use of about 2.0 standard drinks of alcohol per week. He reports that he does not use drugs.   Family History:  The patient's family history includes Heart attack in his father.    ROS:  Please see the history of present illness.   All other systems are personally reviewed and negative.    Exam:    Vital Signs:  BP 117/63   Pulse 67   Wt 179 lb (81.2 kg)   SpO2 97%   BMI 25.68 kg/m    Well appearing, alert and conversant, regular work of breathing,  good skin color Eyes- anicteric, neuro- grossly intact, skin- no apparent rash or lesions or cyanosis, mouth- oral mucosa is pink   Labs/Other Tests and Data Reviewed:    Recent Labs: 09/25/2018: BUN 21; Creatinine, Ser 1.38; Platelets 248 10/10/2018: Hemoglobin 14.6; Potassium 4.3; Sodium 140 02/09/2019: ALT 18   Wt Readings from Last 3 Encounters:  04/01/19 179 lb (81.2 kg)  02/11/19 187 lb 9.6 oz (85.1 kg)  10/31/18 187  lb (84.8 kg)     Other studies personally reviewed: Additional studies/ records that were reviewed today include: Dr York Cerise notes, AF clinic notes  Review of the above records today demonstrates: as above Echo 02/18/2019 reveals EF 60%, mild LA enlargement  ekg 10/31/18- sinus, qtc <440 msec  ASSESSMENT & PLAN:    1.  Persistent afib Rate controlled chads2vasc score is 3. He is on eliquis We discussed options of tikosyn, ablation, and rate control.  I would advise at this time that we continue rate control.  If his symptoms worsen then tikosyn would be our next preference after covid 19 has passed. Ultimately, rate control may be a good stategy for him.  2. Snoring I would advise sleep study after COVID 19  3. CAD No ischemic symptoms  4. HTN Stable No change required today  5. COVID screen The patient does not have any symptoms that suggest any further testing/ screening at this time.  Social distancing reinforced today.  Follow-up:  AF clinic  every 6 months I will see when needed  Current medicines are reviewed at length with the patient today.   The patient does not have concerns regarding his medicines.  The following changes were made today:  none  Labs/ tests ordered today include:  No orders of the defined types were placed in this encounter.   Patient Risk:  after full review of this patients clinical status, I feel that they are at moderate risk at this time.   Today, I have spent 20 minutes with the patient with telehealth technology discussing afib managent .    SignedThompson Grayer MD, [facility] 04/01/2019 11:02 AM   [place] [address] [place] [address] [phone] (office) [phone] (fax)

## 2019-04-10 ENCOUNTER — Institutional Professional Consult (permissible substitution): Payer: BLUE CROSS/BLUE SHIELD | Admitting: Internal Medicine

## 2019-08-20 ENCOUNTER — Other Ambulatory Visit: Payer: Self-pay | Admitting: Cardiovascular Disease

## 2019-09-17 ENCOUNTER — Other Ambulatory Visit: Payer: Self-pay | Admitting: Cardiovascular Disease

## 2019-09-28 ENCOUNTER — Other Ambulatory Visit: Payer: Self-pay | Admitting: Physician Assistant

## 2019-09-28 DIAGNOSIS — I480 Paroxysmal atrial fibrillation: Secondary | ICD-10-CM

## 2019-09-28 NOTE — Telephone Encounter (Signed)
Eliquis 5mg  refill request received; pt is 73yrs old, weight-81.2kg, Crea- 1.38 on 09/25/2018-needs updated labs, Diagnosis-Afib, and last seen by Dr. Rayann Heman on 04/01/2019 via Telemedicine Visit. Dose is appropriate based on dosing criteria. Will send in refill to requested pharmacy.  Pt is due for labs, looked at appts and the pt has a lab appointment on 10/09/2019 at 815am but no labs linked to the appointment. Therefore, I could not find anything in the chart but saw where the scheduler scheduler both a lab appointment and So called the lab department to make sure I am not missing something and neither of [place] could find what the labs the pt needs.  Will call the pt and remind of the appt he has and let him know that I will add labs on for Eliquis Follow up once I find out if he needs any other labs from his Cardiologist Nurse.

## 2019-09-29 NOTE — Telephone Encounter (Signed)
Spoke with pt's wife since the pt was unavailable and in the shower; Made her aware that the Eliquis refill was sent in and reminded her that pt has a lab appt on 10/09/2019 and Dr. Burt Knack on 10/12/2019 and she stated they are aware. Asked if he wanted to do the labs the same day since it was Eliquis follow up and she stated he normally would prefer his labs done a couple days before so Dr. Burt Knack could look at them. Therefore, pt will keep the appt for lab on 10/09/2019 and appt with Dr. Burt Knack on 10/12/2019.

## 2019-10-09 ENCOUNTER — Other Ambulatory Visit: Payer: BC Managed Care – PPO | Admitting: *Deleted

## 2019-10-09 ENCOUNTER — Other Ambulatory Visit: Payer: Self-pay

## 2019-10-09 DIAGNOSIS — I480 Paroxysmal atrial fibrillation: Secondary | ICD-10-CM

## 2019-10-09 LAB — CBC
Hematocrit: 41.3 % (ref 37.5–51.0)
Hemoglobin: 14.1 g/dL (ref 13.0–17.7)
MCH: 32.4 pg (ref 26.6–33.0)
MCHC: 34.1 g/dL (ref 31.5–35.7)
MCV: 95 fL (ref 79–97)
Platelets: 170 10*3/uL (ref 150–450)
RBC: 4.35 x10E6/uL (ref 4.14–5.80)
RDW: 13.3 % (ref 11.6–15.4)
WBC: 6.4 10*3/uL (ref 3.4–10.8)

## 2019-10-09 LAB — BASIC METABOLIC PANEL
BUN/Creatinine Ratio: 20 (ref 10–24)
BUN: 30 mg/dL — ABNORMAL HIGH (ref 8–27)
CO2: 25 mmol/L (ref 20–29)
Calcium: 9.2 mg/dL (ref 8.6–10.2)
Chloride: 100 mmol/L (ref 96–106)
Creatinine, Ser: 1.52 mg/dL — ABNORMAL HIGH (ref 0.76–1.27)
GFR calc Af Amer: 52 mL/min/{1.73_m2} — ABNORMAL LOW (ref >59)
GFR calc non Af Amer: 45 mL/min/{1.73_m2} — ABNORMAL LOW (ref >59)
Glucose: 101 mg/dL — ABNORMAL HIGH (ref 65–99)
Potassium: 3.8 mmol/L (ref 3.5–5.2)
Sodium: 139 mmol/L (ref 134–144)

## 2019-10-12 ENCOUNTER — Other Ambulatory Visit: Payer: Self-pay

## 2019-10-12 ENCOUNTER — Ambulatory Visit (INDEPENDENT_AMBULATORY_CARE_PROVIDER_SITE_OTHER): Payer: BC Managed Care – PPO | Admitting: Cardiovascular Disease

## 2019-10-12 ENCOUNTER — Encounter: Payer: Self-pay | Admitting: Cardiovascular Disease

## 2019-10-12 VITALS — BP 126/82 | HR 52 | Ht 70.0 in | Wt 186.4 lb

## 2019-10-12 DIAGNOSIS — I10 Essential (primary) hypertension: Secondary | ICD-10-CM | POA: Diagnosis not present

## 2019-10-12 DIAGNOSIS — E782 Mixed hyperlipidemia: Secondary | ICD-10-CM | POA: Diagnosis not present

## 2019-10-12 DIAGNOSIS — I4819 Other persistent atrial fibrillation: Secondary | ICD-10-CM | POA: Diagnosis not present

## 2019-10-12 DIAGNOSIS — Z9889 Other specified postprocedural states: Secondary | ICD-10-CM | POA: Diagnosis not present

## 2019-10-12 NOTE — Patient Instructions (Addendum)
Medication Instructions:  Your provider recommends that you continue on your current medications as directed. Please refer to the Current Medication list given to you today.   *If you need a refill on your cardiac medications before your next appointment, please call your pharmacy*  Testing/Procedures: Your provider has requested that you have an echocardiogram in 6 months. Echocardiography is a painless test that uses sound waves to create images of your heart. It provides your doctor with information about the size and shape of your heart and how well your heart's chambers and valves are working. This procedure takes approximately one hour. There are no restrictions for this procedure.  Follow-Up: You will be called to arrange your echo and office visit with Dr. Burt Knack when his April 2021 schedule is available.

## 2019-10-12 NOTE — Progress Notes (Signed)
Cardiology Office Note:    Date:  10/12/2019   ID:  Henry Page, DOB 10/25/1947, MRN [id number]  PCP:  Glenda Chroman, MD  Cardiologist:  Sherren Mocha, MD  Electrophysiologist:  None   Referring MD: Glenda Chroman, MD   Chief Complaint  Patient presents with  . Atrial Fibrillation    History of Present Illness:    Henry Page is a 73 y.o. male with a hx of mitral valve disease, coronary artery disease, and paroxysmal now persistent atrial fibrillation.  The patient underwent CABG and mitral valve repair with maze procedure in 2014.  He had recurrence of atrial fibrillation diagnosed in 2018 at which time he underwent cardioversion.  He has been in persistent atrial fibrillation since November 2019.  He has seen Dr. Rayann Heman in formal EP consultation and ongoing rate control/anticoagulation was recommended.  He is here alone today. He reports episodic dizziness with postural change and a few times he has had extreme lightheadedness when he first stands up.  He otherwise feels well.  He denies chest pain, chest pressure, or shortness of breath.  He has no exertional symptoms.  He denies edema, orthopnea, or PND.  Past Medical History:  Diagnosis Date  . Anxiety   . Atrial fibrillation ([place]) 07/06/2013  . CKD (chronic kidney disease), stage III   . Essential hypertension, benign 07/06/2013  . Kidney stones    some passed spontaneous, one retrieved by cystoscope   . 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 S/P Maze operation for atrial fibrillation 07/22/2013   Complete bilateral atrial lesion set using bipolar radiofrequency and cryothermy ablation with clipping of LA appendage   . 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 Sleep apnea     Past Surgical  History:  Procedure Laterality Date  . CARDIOVERSION N/A 02/08/2017   Procedure: CARDIOVERSION;  Surgeon: Dorothy Spark, MD;  Location: [facility] ENDOSCOPY;  Service: Cardiovascular;  Laterality: N/A;  . CARDIOVERSION N/A 10/10/2018   Procedure: CARDIOVERSION;  Surgeon: Sanda Klein, MD;  Location: [facility] ENDOSCOPY;  Service: Cardiovascular;  Laterality: N/A;  . CORONARY ARTERY BYPASS GRAFT N/A 07/22/2013   Procedure: CORONARY ARTERY BYPASS GRAFTING (CABG);  Surgeon: Rexene Alberts, MD;  Location: [place];  Service: Open Heart Surgery;  Laterality: N/A;  . CYSTOSCOPY    . INTRAOPERATIVE TRANSESOPHAGEAL ECHOCARDIOGRAM N/A 07/22/2013   Procedure: INTRAOPERATIVE TRANSESOPHAGEAL ECHOCARDIOGRAM;  Surgeon: Rexene Alberts, MD;  Location: [place];  Service: Open Heart Surgery;  Laterality: N/A;  . LEFT AND RIGHT HEART CATHETERIZATION WITH CORONARY ANGIOGRAM  07/07/2013   Procedure: LEFT AND RIGHT HEART CATHETERIZATION WITH CORONARY ANGIOGRAM;  Surgeon: Thayer Headings, MD;  Location: [facility] CATH LAB;  Service: Cardiovascular;;  . MAZE N/A 07/22/2013   Procedure: MAZE;  Surgeon: Rexene Alberts, MD;  Location: [place];  Service: Open Heart Surgery;  Laterality: N/A;  . MITRAL VALVE REPAIR N/A 07/22/2013   Procedure: MITRAL VALVE REPAIR (MVR);  Surgeon: Rexene Alberts, MD;  Location: [place];  Service: Open Heart Surgery;  Laterality: N/A;  . PATENT FORAMEN OVALE CLOSURE N/A 07/22/2013   Procedure: PATENT FORAMEN OVALE CLOSURE;  Surgeon: Rexene Alberts, MD;  Location: [place];  Service: Open Heart Surgery;  Laterality: N/A;  .  TEE WITHOUT CARDIOVERSION N/A 07/08/2013   Procedure: TRANSESOPHAGEAL ECHOCARDIOGRAM (TEE);  Surgeon: Larey Dresser, MD;  Location: [place];  Service: Cardiovascular;  Laterality: N/A;  . TEE WITHOUT CARDIOVERSION N/A 02/08/2017   Procedure: TRANSESOPHAGEAL ECHOCARDIOGRAM (TEE);  Surgeon: Dorothy Spark, MD;  Location[facility] ENDOSCOPY;  Service: Cardiovascular;  Laterality: N/A;  . TONSILLECTOMY       Current Medications: Current Meds  Medication Sig  . amoxicillin (AMOXIL) 500 MG capsule TAKE FOUR CAPSULES BY MOUTH ONE HOUR PRIOR TO ALL DENTAL APPOINTMENTS  . benazepril (LOTENSIN) 10 MG tablet TAKE 1 TABLET BY MOUTH EVERY DAY  . cetirizine (ZYRTEC) 10 MG tablet Take 10 mg by mouth daily as needed for allergies.  Marland Kitchen ELIQUIS 5 MG TABS tablet TAKE 1 TABLET BY MOUTH TWICE DAILY  . hydrochlorothiazide (HYDRODIURIL) 25 MG tablet TAKE 1 TABLET BY MOUTH EVERY DAY  . ibuprofen (ADVIL,MOTRIN) 200 MG tablet Take 400 mg by mouth daily as needed for pain or fever.  . metoprolol tartrate (LOPRESSOR) 25 MG tablet TAKE 1 TABLET BY MOUTH TWICE DAILY  . rosuvastatin (CRESTOR) 10 MG tablet TAKE 1 TABLET BY MOUTH EVERY DAY  . sildenafil (REVATIO) 20 MG tablet Take 5 tablets (100 mg total) by mouth daily as needed.     Allergies:   Shellfish allergy, Sulfa antibiotics, and Meperidine and related   Social History   Socioeconomic History  . Marital status: Married    Spouse name: Not on file  . Number of children: Not on file  . Years of education: Not on file  . Highest education level: Not on file  Occupational History  . Occupation: Geographical information systems officer  . Financial resource strain: Not on file  . Food insecurity    Worry: Not on file    Inability: Not on file  . Transportation needs    Medical: Not on file    Non-medical: Not on file  Tobacco Use  . Smoking status: Never Smoker  . Smokeless tobacco: Never Used  Substance and Sexual Activity  . Alcohol use: Yes    Alcohol/week: 2.0 standard drinks    Types: 2 Standard drinks or equivalent per week  . Drug use: No  . Sexual activity: Yes  Lifestyle  . Physical activity    Days per week: Not on file    Minutes per session: Not on file  . Stress: Not on file  Relationships  . Social Herbalist on phone: Not on file    Gets together: Not on file    Attends religious service: Not on file    Active member of club or  organization: Not on file    Attends meetings of clubs or organizations: Not on file    Relationship status: Not on file  Other Topics Concern  . Not on file  Social History Narrative  . Not on file     Family History: The patient's family history includes Heart attack in his father.  ROS:   Please see the history of present illness.    All other systems reviewed and are negative.  EKGs/Labs/Other Studies Reviewed:    The following studies were reviewed today: Echo 02/18/2019: IMPRESSIONS    1. Atrial fibrillation throughout exam.  2. The left ventricle has normal systolic function with an ejection fraction of 60-65%. The cavity size was normal. There is mildly increased left ventricular wall thickness. Left ventricular diastology could not be evaluated due to mitral valve  replacement/repair. There is  abnormal septal motion likely secondary to post operative state.  3. The right ventricle has normal systolic function. The cavity was normal. There is no increase in right ventricular wall thickness. Right ventricular systolic pressure is mildly elevated with an estimated pressure of 35.8 mmHg.  4. Left atrial size was mildly dilated.  5. A 34 mm Sorin memo annuloplasty valve is present in the mitral position. Procedure Date: 07/22/2013. Trivial mitral valve regurgitation.  6. MV Mean diastolic grad average: 4 mmHg at HR 95 bpm average.  7. The tricuspid valve is normal in structure. Mild TR.  8. The inferior vena cava was dilated in size with <50% respiratory variability.  9. The aortic valve is normal in structure.   EKG:  EKG is ordered today.  The ekg ordered today demonstrates atrial fibrillation with occasional PVC, HR 91 bpm.   Recent Labs: 02/09/2019: ALT 18 10/09/2019: BUN 30; Creatinine, Ser 1.52; Hemoglobin 14.1; Platelets 170; Potassium 3.8; Sodium 139  Recent Lipid Panel    Component Value Date/Time   CHOL 136 02/09/2019 0940   TRIG 279 (H) 02/09/2019 0940    HDL 32 (L) 02/09/2019 0940   CHOLHDL 4.3 02/09/2019 0940   CHOLHDL 3.4 06/05/2016 1048   VLDL 50 (H) 06/05/2016 1048   LDLCALC 48 02/09/2019 0940   LDLDIRECT 42.0 04/26/2015 0950    Physical Exam:    VS:  BP 126/82   Pulse (!) 52   Ht 5\' 10"  (1.778 m)   Wt 186 lb 6.4 oz (84.6 kg)   SpO2 98%   BMI 26.75 kg/m     Wt Readings from Last 3 Encounters:  10/12/19 186 lb 6.4 oz (84.6 kg)  04/01/19 179 lb (81.2 kg)  02/11/19 187 lb 9.6 oz (85.1 kg)     GEN:  Well nourished, well developed in no acute distress HEENT: Normal NECK: No JVD; No carotid bruits LYMPHATICS: No lymphadenopathy CARDIAC: irregularly irregular, no murmurs, rubs, gallops RESPIRATORY:  Clear to auscultation without rales, wheezing or rhonchi  ABDOMEN: Soft, non-tender, non-distended MUSCULOSKELETAL:  No edema; No deformity  SKIN: Warm and dry NEUROLOGIC:  Alert and oriented x 3 PSYCHIATRIC:  Normal affect   ASSESSMENT:    1. Persistent atrial fibrillation ([place])   2. S/P mitral valve repair   3. Essential hypertension, benign   4. Mixed hyperlipidemia    PLAN:    In order of problems listed above:  1. Discussed rate control versus rhythm control strategy.  He is not at all interested in an antiarrhythmic drug such as Tikosyn.  He feels well and has New York Heart Association functional class I symptoms.  He has been evaluated by Dr. Rayann Heman who did not recommend proceeding with an ablation.  He will continue with apixaban for anticoagulation and metoprolol for heart rate control. 2. The patient has normal function of his mitral valve repair based on most recent echo.  I plan to repeat an echocardiogram in 6 months when he returns for follow-up evaluation. 3. Blood pressure is well controlled on a combination of benazepril, hydrochlorothiazide, and metoprolol. 4. Lipids are reviewed with LDL at goal on low-dose rosuvastatin.  Triglycerides are elevated.  We discussed lifestyle modification.   Medication  Adjustments/Labs and Tests Ordered: Current medicines are reviewed at length with the patient today.  Concerns regarding medicines are outlined above.  Orders Placed This Encounter  Procedures  . EKG 12-Lead  . ECHOCARDIOGRAM COMPLETE   No orders of the defined types were placed in this encounter.  Patient Instructions  Medication Instructions:  Your provider recommends that you continue on your current medications as directed. Please refer to the Current Medication list given to you today.   *If you need a refill on your cardiac medications before your next appointment, please call your pharmacy*  Testing/Procedures: Your provider has requested that you have an echocardiogram in 6 months. Echocardiography is a painless test that uses sound waves to create images of your heart. It provides your doctor with information about the size and shape of your heart and how well your heart's chambers and valves are working. This procedure takes approximately one hour. There are no restrictions for this procedure.  Follow-Up: You will be called to arrange your echo and office visit with Dr. Burt Knack when his April 2021 schedule is available.     Signed, Sherren Mocha, MD  10/12/2019 11:00 AM    [place]

## 2019-11-18 ENCOUNTER — Other Ambulatory Visit: Payer: Self-pay | Admitting: Cardiovascular Disease

## 2019-11-23 DIAGNOSIS — Z1159 Encounter for screening for other viral diseases: Secondary | ICD-10-CM | POA: Diagnosis not present

## 2019-12-17 ENCOUNTER — Other Ambulatory Visit: Payer: Self-pay | Admitting: Cardiovascular Disease

## 2020-01-14 DIAGNOSIS — Z23 Encounter for immunization: Secondary | ICD-10-CM | POA: Diagnosis not present

## 2020-01-22 ENCOUNTER — Other Ambulatory Visit: Payer: Self-pay | Admitting: Cardiovascular Disease

## 2020-01-25 ENCOUNTER — Telehealth: Payer: Self-pay

## 2020-01-25 NOTE — Telephone Encounter (Signed)
**Note De-Identified Shaeleigh Graw Obfuscation** I have started a Sildenafil PA through covermymeds. Key: [facility]

## 2020-02-16 ENCOUNTER — Other Ambulatory Visit: Payer: Self-pay | Admitting: Cardiovascular Disease

## 2020-02-16 NOTE — Telephone Encounter (Signed)
Prescription refill request for Eliquis received.  Last office visit: Burt Knack, 10/12/2019 Scr: 1.52, 10/09/2019 Age:  74 y.o. Weight: 84.6 kg   Prescription refill sent.

## 2020-02-19 DIAGNOSIS — Z23 Encounter for immunization: Secondary | ICD-10-CM | POA: Diagnosis not present

## 2020-04-14 ENCOUNTER — Other Ambulatory Visit: Payer: Self-pay

## 2020-04-14 ENCOUNTER — Ambulatory Visit (HOSPITAL_COMMUNITY): Payer: BC Managed Care – PPO | Attending: Cardiovascular Disease

## 2020-04-14 DIAGNOSIS — Z9889 Other specified postprocedural states: Secondary | ICD-10-CM | POA: Insufficient documentation

## 2020-04-14 DIAGNOSIS — I4819 Other persistent atrial fibrillation: Secondary | ICD-10-CM | POA: Insufficient documentation

## 2020-04-18 ENCOUNTER — Ambulatory Visit (INDEPENDENT_AMBULATORY_CARE_PROVIDER_SITE_OTHER): Payer: BC Managed Care – PPO | Admitting: Cardiovascular Disease

## 2020-04-18 ENCOUNTER — Other Ambulatory Visit: Payer: Self-pay

## 2020-04-18 VITALS — BP 140/90 | HR 80 | Ht 70.0 in | Wt 190.8 lb

## 2020-04-18 DIAGNOSIS — I2510 Atherosclerotic heart disease of native coronary artery without angina pectoris: Secondary | ICD-10-CM

## 2020-04-18 DIAGNOSIS — I4819 Other persistent atrial fibrillation: Secondary | ICD-10-CM

## 2020-04-18 DIAGNOSIS — I10 Essential (primary) hypertension: Secondary | ICD-10-CM

## 2020-04-18 DIAGNOSIS — E782 Mixed hyperlipidemia: Secondary | ICD-10-CM

## 2020-04-18 NOTE — Progress Notes (Signed)
Cardiology Office Note:    Date:  04/18/2020   ID:  Henry Page, DOB 10/25/1947, MRN [id number]  PCP:  Glenda Chroman, MD  Cardiologist:  Sherren Mocha, MD  Electrophysiologist:  None   Referring MD: Glenda Chroman, MD   No chief complaint on file.   History of Present Illness:    Henry Page is a 74 y.o. male with a hx of mitral valve disease, coronary artery disease, and atrial fibrillation. The patient underwent CABG, mitral valve repair, and maze procedure in 2014.  He developed recurrent atrial fibrillation in 2018 and underwent TEE guided cardioversion at that time.    He developed recurrent atrial fibrillation and was seen in the atrial fibrillation clinic last in 2019.  He declined antiarrhythmic drug therapy at that time.  He then saw Dr. Rayann Heman in consultation in 2020 and again elected to continue with rate control and anticoagulation.  The patient is here alone today.  He complains of some postural dizziness, otherwise doing well.  He has mild dyspnea with exertion but can generally walk through this and symptoms resolved.  He denies chest pain or pressure.  He denies orthopnea, PND, leg swelling, or heart palpitations.  He is compliant with his medications.  No stroke or TIA symptoms.  Past Medical History:  Diagnosis Date  . Anxiety   . Atrial fibrillation ([place]) 07/06/2013  . CKD (chronic kidney disease), stage III   . Essential hypertension, benign 07/06/2013  . Kidney stones    some passed spontaneous, one retrieved by cystoscope   . 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 S/P Maze operation for atrial fibrillation 07/22/2013   Complete bilateral atrial lesion set using bipolar radiofrequency and cryothermy ablation with clipping of LA appendage   . S/P mitral valve repair 07/22/2013   Complex valvuloplasty including triangular resection of posterior leaflet with artificial Gore-tex neocord  placement x2 and 73mm Sorin Memo 3D ring annuloplasty  . Sleep apnea     Past Surgical History:  Procedure Laterality Date  . CARDIOVERSION N/A 02/08/2017   Procedure: CARDIOVERSION;  Surgeon: Dorothy Spark, MD;  Location[facility] ENDOSCOPY;  Service: Cardiovascular;  Laterality: N/A;  . CARDIOVERSION N/A 10/10/2018   Procedure: CARDIOVERSION;  Surgeon: Sanda Klein, MD;  Location: [facility] ENDOSCOPY;  Service: Cardiovascular;  Laterality: N/A;  . CORONARY ARTERY BYPASS GRAFT N/A 07/22/2013   Procedure: CORONARY ARTERY BYPASS GRAFTING (CABG);  Surgeon: Rexene Alberts, MD;  Location: [place];  Service: Open Heart Surgery;  Laterality: N/A;  . CYSTOSCOPY    . INTRAOPERATIVE TRANSESOPHAGEAL ECHOCARDIOGRAM N/A 07/22/2013   Procedure: INTRAOPERATIVE TRANSESOPHAGEAL ECHOCARDIOGRAM;  Surgeon: Rexene Alberts, MD;  Location: [place];  Service: Open Heart Surgery;  Laterality: N/A;  . LEFT AND RIGHT HEART CATHETERIZATION WITH CORONARY ANGIOGRAM  07/07/2013   Procedure: LEFT AND RIGHT HEART CATHETERIZATION WITH CORONARY ANGIOGRAM;  Surgeon: Thayer Headings, MD;  Location: [facility] CATH LAB;  Service: Cardiovascular;;  . MAZE N/A 07/22/2013   Procedure: MAZE;  Surgeon: Rexene Alberts, MD;  Location: [place];  Service: Open Heart Surgery;  Laterality: N/A;  . MITRAL VALVE REPAIR N/A 07/22/2013   Procedure: MITRAL VALVE REPAIR (MVR);  Surgeon: Rexene Alberts, MD;  Location: [place];  Service: Open Heart Surgery;  Laterality: N/A;  . PATENT FORAMEN OVALE CLOSURE N/A 07/22/2013   Procedure: PATENT FORAMEN OVALE CLOSURE;  Surgeon: Braulio Conte  Keturah Barre, MD;  Location: [place];  Service: Open Heart Surgery;  Laterality: N/A;  . TEE WITHOUT CARDIOVERSION N/A 07/08/2013   Procedure: TRANSESOPHAGEAL ECHOCARDIOGRAM (TEE);  Surgeon: Larey Dresser, MD;  Location: [place];  Service: Cardiovascular;  Laterality: N/A;  . TEE WITHOUT CARDIOVERSION N/A 02/08/2017   Procedure: TRANSESOPHAGEAL ECHOCARDIOGRAM (TEE);  Surgeon: Dorothy Spark,  MD;  Location: [facility] ENDOSCOPY;  Service: Cardiovascular;  Laterality: N/A;  . TONSILLECTOMY      Current Medications: Current Meds  Medication Sig  . amoxicillin (AMOXIL) 500 MG capsule TAKE FOUR CAPSULES BY MOUTH ONE HOUR PRIOR TO ALL DENTAL APPOINTMENTS  . benazepril (LOTENSIN) 10 MG tablet TAKE 1 TABLET BY MOUTH EVERY DAY  . cetirizine (ZYRTEC) 10 MG tablet Take 10 mg by mouth daily as needed for allergies.  Marland Kitchen ELIQUIS 5 MG TABS tablet TAKE 1 TABLET BY MOUTH TWICE DAILY  . hydrochlorothiazide (HYDRODIURIL) 25 MG tablet TAKE 1 TABLET BY MOUTH EVERY DAY  . metoprolol tartrate (LOPRESSOR) 25 MG tablet TAKE 1 TABLET BY MOUTH TWICE DAILY  . rosuvastatin (CRESTOR) 10 MG tablet TAKE 1 TABLET BY MOUTH EVERY DAY  . sildenafil (REVATIO) 20 MG tablet TAKE FIVE TABLETS BY MOUTH DAILY AS NEEDED     Allergies:   Shellfish allergy, Sulfa antibiotics, and Meperidine and related   Social History   Socioeconomic History  . Marital status: Married    Spouse name: Not on file  . Number of children: Not on file  . Years of education: Not on file  . Highest education level: Not on file  Occupational History  . Occupation: Press photographer  Tobacco Use  . Smoking status: Never Smoker  . Smokeless tobacco: Never Used  Substance and Sexual Activity  . Alcohol use: Yes    Alcohol/week: 2.0 standard drinks    Types: 2 Standard drinks or equivalent per week  . Drug use: No  . Sexual activity: Yes  Other Topics Concern  . Not on file  Social History Narrative  . Not on file   Social Determinants of Health   Financial Resource Strain:   . Difficulty of Paying Living Expenses:   Food Insecurity:   . Worried About Charity fundraiser in the Last Year:   . Arboriculturist in the Last Year:   Transportation Needs:   . Film/video editor (Medical):   Marland Kitchen Lack of Transportation (Non-Medical):   Physical Activity:   . Days of Exercise per Week:   . Minutes of Exercise per Session:   Stress:   . Feeling of  Stress :   Social Connections:   . Frequency of Communication with Friends and Family:   . Frequency of Social Gatherings with Friends and Family:   . Attends Religious Services:   . Active Member of Clubs or Organizations:   . Attends Archivist Meetings:   Marland Kitchen Marital Status:      Family History: The patient's family history includes Heart attack in his father.  ROS:   Please see the history of present illness.    All other systems reviewed and are negative.  EKGs/Labs/Other Studies Reviewed:    The following studies were reviewed today: Echo 04/14/2020: IMPRESSIONS    1. Left ventricular ejection fraction, by estimation, is 55 to 60%. The  left ventricle has normal function. The left ventricle has no regional  wall motion abnormalities. Left ventricular diastolic parameters are  indeterminate.  2. Right ventricular systolic function is normal. The right  ventricular  size is normal. There is mildly elevated pulmonary artery systolic  pressure.  3. Left atrial size was moderately dilated.  4. Right atrial size was moderately dilated.  5. Post repair with 34 mm Sorin Memo ring. trivial residual MR and only 5  mmHg diastolic gradient at high HR of 90 bpm in afib . The mitral valve  has been repaired/replaced. Trivial mitral valve regurgitation. No  evidence of mitral stenosis. There is a  34 mm Sorin memo 3D ring annuloplasty present in the mitral position.  Procedure Date: 07/22/2013.  6. The aortic valve is tricuspid. Aortic valve regurgitation is not  visualized. Mild aortic valve sclerosis is present, with no evidence of  aortic valve stenosis.  7. The inferior vena cava is normal in size with greater than 50%  respiratory variability, suggesting right atrial pressure of 3 mmHg.   FINDINGS  Left Ventricle: Left ventricular ejection fraction, by estimation, is 55  to 60%. The left ventricle has normal function. The left ventricle has no  regional wall  motion abnormalities. The left ventricular internal cavity  size was normal in size. There is  no left ventricular hypertrophy. Left ventricular diastolic parameters  are indeterminate.   Right Ventricle: The right ventricular size is normal. No increase in  right ventricular wall thickness. Right ventricular systolic function is  normal. There is mildly elevated pulmonary artery systolic pressure. The  tricuspid regurgitant velocity is 2.66  m/s, and with an assumed right atrial pressure of 3 mmHg, the estimated  right ventricular systolic pressure is 123456 mmHg.   Left Atrium: Left atrial size was moderately dilated.   Right Atrium: Right atrial size was moderately dilated.   Pericardium: There is no evidence of pericardial effusion.   Mitral Valve: Post repair with 34 mm Sorin Memo ring. trivial residual MR  and only 5 mmHg diastolic gradient at high HR of 90 bpm in afib. The  mitral valve has been repaired/replaced. Normal mobility of the mitral  valve leaflets. Trivial mitral valve  regurgitation. There is a 34 mm Sorin memo 3D ring annuloplasty present in  the mitral position. Procedure Date: 07/22/2013. No evidence of mitral  valve stenosis. MV peak gradient, 7.8 mmHg. The mean mitral valve gradient  is 3.2 mmHg.   Tricuspid Valve: The tricuspid valve is normal in structure. Tricuspid  valve regurgitation is mild . No evidence of tricuspid stenosis.   Aortic Valve: The aortic valve is tricuspid. Aortic valve regurgitation is  not visualized. Mild aortic valve sclerosis is present, with no evidence  of aortic valve stenosis.   Pulmonic Valve: The pulmonic valve was normal in structure. Pulmonic valve  regurgitation is not visualized. No evidence of pulmonic stenosis.   Aorta: The aortic root is normal in size and structure.   Venous: The inferior vena cava is normal in size with greater than 50%  respiratory variability, suggesting right atrial pressure of 3 mmHg.    IAS/Shunts: No atrial level shunt detected by color flow Doppler.   EKG:  EKG is not ordered today.    Recent Labs: 10/09/2019: BUN 30; Creatinine, Ser 1.52; Hemoglobin 14.1; Platelets 170; Potassium 3.8; Sodium 139  Recent Lipid Panel    Component Value Date/Time   CHOL 136 02/09/2019 0940   TRIG 279 (H) 02/09/2019 0940   HDL 32 (L) 02/09/2019 0940   CHOLHDL 4.3 02/09/2019 0940   CHOLHDL 3.4 06/05/2016 1048   VLDL 50 (H) 06/05/2016 1048   LDLCALC 48 02/09/2019 0940  LDLDIRECT 42.0 04/26/2015 0950    Physical Exam:    VS:  BP 140/90   Pulse 80   Ht 5\' 10"  (1.778 m)   Wt 190 lb 12.8 oz (86.5 kg)   BMI 27.38 kg/m     Wt Readings from Last 3 Encounters:  04/18/20 190 lb 12.8 oz (86.5 kg)  10/12/19 186 lb 6.4 oz (84.6 kg)  04/01/19 179 lb (81.2 kg)     GEN:  Well nourished, well developed in no acute distress HEENT: Normal NECK: No JVD; No carotid bruits LYMPHATICS: No lymphadenopathy CARDIAC: irregularly irregular, no murmurs, rubs, gallops RESPIRATORY:  Clear to auscultation without rales, wheezing or rhonchi  ABDOMEN: Soft, non-tender, non-distended MUSCULOSKELETAL:  No edema; No deformity  SKIN: Warm and dry NEUROLOGIC:  Alert and oriented x 3 PSYCHIATRIC:  Normal affect   ASSESSMENT:    1. Persistent atrial fibrillation ([place])   2. Mixed hyperlipidemia   3. Coronary artery disease involving native coronary artery of native heart without angina pectoris   4. Essential hypertension, benign    PLAN:    In order of problems listed above:  1. Tolerating apixaban without bleeding problems.  Heart rate control is reasonable on metoprolol.  We again discussed considerations for ablation, dofetilide or other antiarrhythmic therapy, or rate control/anticoagulation.  We reviewed pros and cons to each approach and the patient prefers to continue with rate control and anticoagulation.  He has seen Dr. Rayann Heman in formal EP consultation about 1 year ago. 2. He will  continue on rosuvastatin 10 mg daily. 3. Stable without symptoms of angina.  No antiplatelet therapy because of chronic oral anticoagulation.  Continue beta-blocker and statin drug. 4. Blood pressure is well controlled on home readings.  He does have some postural dizziness but this is not a big problem for him at this point.  Would continue the same medical program which includes benazepril, hydrochlorothiazide, and metoprolol.  Will repeat his metabolic panel when he returns for follow-up in 6 months.   Medication Adjustments/Labs and Tests Ordered: Current medicines are reviewed at length with the patient today.  Concerns regarding medicines are outlined above.  No orders of the defined types were placed in this encounter.  No orders of the defined types were placed in this encounter.   Patient Instructions  Medication Instructions:  Your provider recommends that you continue on your current medications as directed. Please refer to the Current Medication list given to you today.   *If you need a refill on your cardiac medications before your next appointment, please call your pharmacy*  Lab Work: Please return for fasting lab work a couple days prior to your appointment in 6 months. If you have labs (blood work) drawn today and your tests are completely normal, you will receive your results only by: Marland Kitchen MyChart Message (if you have MyChart) OR . A paper copy in the mail If you have any lab test that is abnormal or we need to change your treatment, we will call you to review the results.  Follow-Up: At [facility], you and your health needs are our priority.  As part of our continuing mission to provide you with exceptional heart care, we have created designated Provider Care Teams.  These Care Teams include your primary Cardiologist (physician) and Advanced Practice Providers (APPs -  Physician Assistants and Nurse Practitioners) who all work together to provide you with the care you  need, when you need it. Your next appointment:   6 month(s) The  format for your next appointment:   In Person Provider:   Sherren Mocha, MD     Signed, Sherren Mocha, MD  04/18/2020 10:40 AM    [place]

## 2020-04-18 NOTE — Patient Instructions (Signed)
Medication Instructions:  Your provider recommends that you continue on your current medications as directed. Please refer to the Current Medication list given to you today.   *If you need a refill on your cardiac medications before your next appointment, please call your pharmacy*  Lab Work: Please return for fasting lab work a couple days prior to your appointment in 6 months. If you have labs (blood work) drawn today and your tests are completely normal, you will receive your results only by: Marland Kitchen MyChart Message (if you have MyChart) OR . A paper copy in the mail If you have any lab test that is abnormal or we need to change your treatment, we will call you to review the results.  Follow-Up: At [facility], you and your health needs are our priority.  As part of our continuing mission to provide you with exceptional heart care, we have created designated Provider Care Teams.  These Care Teams include your primary Cardiologist (physician) and Advanced Practice Providers (APPs -  Physician Assistants and Nurse Practitioners) who all work together to provide you with the care you need, when you need it. Your next appointment:   6 month(s) The format for your next appointment:   In Person Provider:   Sherren Mocha, MD

## 2020-05-24 DIAGNOSIS — D485 Neoplasm of uncertain behavior of skin: Secondary | ICD-10-CM | POA: Diagnosis not present

## 2020-05-24 DIAGNOSIS — D0461 Carcinoma in situ of skin of right upper limb, including shoulder: Secondary | ICD-10-CM | POA: Diagnosis not present

## 2020-05-24 DIAGNOSIS — L570 Actinic keratosis: Secondary | ICD-10-CM | POA: Diagnosis not present

## 2020-05-24 DIAGNOSIS — D225 Melanocytic nevi of trunk: Secondary | ICD-10-CM | POA: Diagnosis not present

## 2020-06-16 DIAGNOSIS — L570 Actinic keratosis: Secondary | ICD-10-CM | POA: Diagnosis not present

## 2020-06-16 DIAGNOSIS — D485 Neoplasm of uncertain behavior of skin: Secondary | ICD-10-CM | POA: Diagnosis not present

## 2020-06-30 DIAGNOSIS — C44622 Squamous cell carcinoma of skin of right upper limb, including shoulder: Secondary | ICD-10-CM | POA: Diagnosis not present

## 2020-08-08 ENCOUNTER — Other Ambulatory Visit: Payer: Self-pay | Admitting: Cardiovascular Disease

## 2020-08-08 NOTE — Telephone Encounter (Signed)
Last OV 04/18/20 Scr 1.52 on 10/09/2019 74 years old 1kg

## 2020-09-11 ENCOUNTER — Other Ambulatory Visit: Payer: Self-pay | Admitting: Cardiovascular Disease

## 2020-10-31 DIAGNOSIS — H52223 Regular astigmatism, bilateral: Secondary | ICD-10-CM | POA: Diagnosis not present

## 2020-10-31 DIAGNOSIS — H2513 Age-related nuclear cataract, bilateral: Secondary | ICD-10-CM | POA: Diagnosis not present

## 2020-10-31 DIAGNOSIS — H524 Presbyopia: Secondary | ICD-10-CM | POA: Diagnosis not present

## 2020-10-31 DIAGNOSIS — H5203 Hypermetropia, bilateral: Secondary | ICD-10-CM | POA: Diagnosis not present

## 2020-11-07 ENCOUNTER — Other Ambulatory Visit: Payer: Self-pay | Admitting: Cardiovascular Disease

## 2020-12-05 ENCOUNTER — Other Ambulatory Visit: Payer: Self-pay | Admitting: Cardiovascular Disease

## 2020-12-21 ENCOUNTER — Other Ambulatory Visit: Payer: BC Managed Care – PPO | Admitting: *Deleted

## 2020-12-21 ENCOUNTER — Other Ambulatory Visit: Payer: Self-pay

## 2020-12-21 DIAGNOSIS — E782 Mixed hyperlipidemia: Secondary | ICD-10-CM

## 2020-12-21 DIAGNOSIS — I4819 Other persistent atrial fibrillation: Secondary | ICD-10-CM

## 2020-12-21 DIAGNOSIS — I2510 Atherosclerotic heart disease of native coronary artery without angina pectoris: Secondary | ICD-10-CM | POA: Diagnosis not present

## 2020-12-21 LAB — CBC WITH DIFFERENTIAL/PLATELET
Basophils Absolute: 0 10*3/uL (ref 0.0–0.2)
Basos: 0 %
EOS (ABSOLUTE): 0.2 10*3/uL (ref 0.0–0.4)
Eos: 2 %
Hematocrit: 43.3 % (ref 37.5–51.0)
Hemoglobin: 15 g/dL (ref 13.0–17.7)
Immature Grans (Abs): 0 10*3/uL (ref 0.0–0.1)
Immature Granulocytes: 0 %
Lymphocytes Absolute: 3.4 10*3/uL — ABNORMAL HIGH (ref 0.7–3.1)
Lymphs: 37 %
MCH: 32.8 pg (ref 26.6–33.0)
MCHC: 34.6 g/dL (ref 31.5–35.7)
MCV: 95 fL (ref 79–97)
Monocytes Absolute: 0.7 10*3/uL (ref 0.1–0.9)
Monocytes: 7 %
Neutrophils Absolute: 4.9 10*3/uL (ref 1.4–7.0)
Neutrophils: 54 %
Platelets: 203 10*3/uL (ref 150–450)
RBC: 4.58 x10E6/uL (ref 4.14–5.80)
RDW: 13.5 % (ref 11.6–15.4)
WBC: 9.2 10*3/uL (ref 3.4–10.8)

## 2020-12-21 LAB — COMPREHENSIVE METABOLIC PANEL
ALT: 25 IU/L (ref 0–44)
AST: 29 IU/L (ref 0–40)
Albumin/Globulin Ratio: 1.6 (ref 1.2–2.2)
Albumin: 4.4 g/dL (ref 3.7–4.7)
Alkaline Phosphatase: 83 IU/L (ref 44–121)
BUN/Creatinine Ratio: 20 (ref 10–24)
BUN: 28 mg/dL — ABNORMAL HIGH (ref 8–27)
Bilirubin Total: 0.5 mg/dL (ref 0.0–1.2)
CO2: 23 mmol/L (ref 20–29)
Calcium: 9.7 mg/dL (ref 8.6–10.2)
Chloride: 96 mmol/L (ref 96–106)
Creatinine, Ser: 1.42 mg/dL — ABNORMAL HIGH (ref 0.76–1.27)
GFR calc Af Amer: 56 mL/min/{1.73_m2} — ABNORMAL LOW (ref >59)
GFR calc non Af Amer: 48 mL/min/{1.73_m2} — ABNORMAL LOW (ref >59)
Globulin, Total: 2.8 g/dL (ref 1.5–4.5)
Glucose: 89 mg/dL (ref 65–99)
Potassium: 3.9 mmol/L (ref 3.5–5.2)
Sodium: 137 mmol/L (ref 134–144)
Total Protein: 7.2 g/dL (ref 6.0–8.5)

## 2020-12-21 LAB — LIPID PANEL
Chol/HDL Ratio: 4.5 ratio (ref 0.0–5.0)
Cholesterol, Total: 138 mg/dL (ref 100–199)
HDL: 31 mg/dL — ABNORMAL LOW (ref >39)
LDL Chol Calc (NIH): 49 mg/dL (ref 0–99)
Triglycerides: 390 mg/dL — ABNORMAL HIGH (ref 0–149)
VLDL Cholesterol Cal: 58 mg/dL — ABNORMAL HIGH (ref 5–40)

## 2020-12-21 LAB — TSH: TSH: 3.56 u[IU]/mL (ref 0.450–4.500)

## 2020-12-26 ENCOUNTER — Telehealth: Payer: Self-pay | Admitting: Pharmacist

## 2020-12-26 ENCOUNTER — Ambulatory Visit: Payer: BC Managed Care – PPO | Admitting: Cardiovascular Disease

## 2020-12-26 MED ORDER — FENOFIBRATE 48 MG PO TABS: 48.0000 mg | ORAL_TABLET | Freq: Every day | ORAL | 11 refills | Status: DC

## 2020-12-26 NOTE — Telephone Encounter (Signed)
Received inquiry from Dr Excell Seltzer regarding elevated triglycerides of 390. Pt confirmed he was fasting when labs were drawn. Reports adherence to rosuvastatin. Called pt to discuss starting Vascepa, however pt mentioned that he has a shellfish allergy. He became clammy and sweaty and experienced stomach cramps when he ate lobster. The same thing happened a few years later when he rechallenged with shrimp. Because of this, will avoid Vascepa and will instead start pt on fenofibrate for elevated TG. Will start pt on lower dose of fenofibrate 48mg  once daily (lower dose due to renal dysfunction). Pt is aware of med plan and was appreciative for assistance.  He also wanted Dr to know that his HR has been stable in the 75-80 range and BP has been pretty well controlled 130s/70s.

## 2020-12-26 NOTE — Telephone Encounter (Signed)
Sounds great thanks [address]

## 2021-02-05 ENCOUNTER — Other Ambulatory Visit: Payer: Self-pay | Admitting: Cardiovascular Disease

## 2021-02-06 NOTE — Telephone Encounter (Signed)
Pt's age 75, wt 86.5 kg, SCr 1.42, CrCl 55.84, last ov w/ [facility] 04/18/20.

## 2021-02-15 ENCOUNTER — Ambulatory Visit: Payer: BC Managed Care – PPO | Admitting: Cardiovascular Disease

## 2021-03-13 ENCOUNTER — Other Ambulatory Visit: Payer: Self-pay

## 2021-03-13 ENCOUNTER — Encounter: Payer: Self-pay | Admitting: Cardiovascular Disease

## 2021-03-13 ENCOUNTER — Ambulatory Visit (INDEPENDENT_AMBULATORY_CARE_PROVIDER_SITE_OTHER): Payer: BC Managed Care – PPO | Admitting: Cardiovascular Disease

## 2021-03-13 VITALS — BP 130/94 | HR 81 | Ht 70.0 in | Wt 186.6 lb

## 2021-03-13 DIAGNOSIS — I10 Essential (primary) hypertension: Secondary | ICD-10-CM

## 2021-03-13 DIAGNOSIS — I4819 Other persistent atrial fibrillation: Secondary | ICD-10-CM

## 2021-03-13 DIAGNOSIS — E782 Mixed hyperlipidemia: Secondary | ICD-10-CM | POA: Diagnosis not present

## 2021-03-13 DIAGNOSIS — I2510 Atherosclerotic heart disease of native coronary artery without angina pectoris: Secondary | ICD-10-CM | POA: Diagnosis not present

## 2021-03-13 DIAGNOSIS — Z9889 Other specified postprocedural states: Secondary | ICD-10-CM | POA: Diagnosis not present

## 2021-03-13 MED ORDER — ROSUVASTATIN CALCIUM 10 MG PO TABS: 10.0000 mg | ORAL_TABLET | Freq: Every day | ORAL | 3 refills | Status: DC

## 2021-03-13 MED ORDER — BENAZEPRIL HCL 10 MG PO TABS: 10.0000 mg | ORAL_TABLET | Freq: Every day | ORAL | 3 refills | Status: DC

## 2021-03-13 MED ORDER — METOPROLOL TARTRATE 25 MG PO TABS: 25.0000 mg | ORAL_TABLET | Freq: Two times a day (BID) | ORAL | 3 refills | Status: DC

## 2021-03-13 MED ORDER — FENOFIBRATE 48 MG PO TABS: 48.0000 mg | ORAL_TABLET | Freq: Every day | ORAL | 3 refills | Status: DC

## 2021-03-13 MED ORDER — APIXABAN 5 MG PO TABS: 5.0000 mg | ORAL_TABLET | Freq: Two times a day (BID) | ORAL | 3 refills | Status: DC

## 2021-03-13 NOTE — Patient Instructions (Addendum)
Medication Instructions:  1) STOP HYDROCHLOROTHIAZIDE *If you need a refill on your cardiac medications before your next appointment, please call your pharmacy*  Lab Work: FASTING LABS: 05/03/21 (anytime between 7:30AM and 4:30PM) If you have labs (blood work) drawn today and your tests are completely normal, you will receive your results only by: Marland Kitchen MyChart Message (if you have MyChart) OR . A paper copy in the mail If you have any lab test that is abnormal or we need to change your treatment, we will call you to review the results.  Follow-Up: You will be called to arrange your 1 year echo and visit.

## 2021-03-13 NOTE — Progress Notes (Signed)
Cardiology Office Note:    Date:  03/13/2021   ID:  [name], DOB 10/25/1947, MRN [id number]  PCP:  Glenda Chroman, MD   [place]  Cardiologist:  Sherren Mocha, MD  Advanced Practice Provider:  No care team member to display Electrophysiologist:  None       Referring MD: Glenda Chroman, MD   Chief Complaint  Patient presents with  . Dizziness    History of Present Illness:    Henry Page is a 75 y.o. male with a hx of mitral valve disease, coronary artery disease, and atrial fibrillation. The patient underwent CABG, mitral valve repair, and maze procedure in 2014. He developed recurrent atrial fibrillation in 2018 and underwent TEE guided cardioversion at that time.   He developed recurrent atrial fibrillation and was seen in the atrial fibrillation clinic last in 2019.  He declined antiarrhythmic drug therapy at that time.  He then saw Dr. Rayann Heman in consultation in 2020 and again elected to continue with rate control and anticoagulation.  He is here alone today. His home BP's usually run 130/70's. He continues to have some postural dizziness. He also wonders if his sinus problems contribute to this. Last night his BP was 122/68 and he has been concerned that his BP has been running low on a more frequent basis.  He denies chest pain, chest pressure, leg swelling, orthopnea, or PND.  The patient is compliant with his medications.  He started fenofibrate last year to treat hypertriglyceridemia.  Past Medical History:  Diagnosis Date  . Anxiety   . Atrial fibrillation ([place]) 07/06/2013  . CKD (chronic kidney disease), stage III ([place])   . Essential hypertension, benign 07/06/2013  . Kidney stones    some passed spontaneous, one retrieved by cystoscope   . 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 S/P Maze operation for atrial fibrillation 07/22/2013   Complete bilateral atrial  lesion set using bipolar radiofrequency and cryothermy ablation with clipping of LA appendage   . S/P mitral valve repair 07/22/2013   Complex valvuloplasty including triangular resection of posterior leaflet with artificial Gore-tex neocord placement x2 and 4mm Sorin Memo 3D ring annuloplasty  . Sleep apnea     Past Surgical History:  Procedure Laterality Date  . CARDIOVERSION N/A 02/08/2017   Procedure: CARDIOVERSION;  Surgeon: Dorothy Spark, MD;  Location[facility] ENDOSCOPY;  Service: Cardiovascular;  Laterality: N/A;  . CARDIOVERSION N/A 10/10/2018   Procedure: CARDIOVERSION;  Surgeon: Sanda Klein, MD;  Location: [facility] ENDOSCOPY;  Service: Cardiovascular;  Laterality: N/A;  . CORONARY ARTERY BYPASS GRAFT N/A 07/22/2013   Procedure: CORONARY ARTERY BYPASS GRAFTING (CABG);  Surgeon: Rexene Alberts, MD;  Location: [place];  Service: Open Heart Surgery;  Laterality: N/A;  . CYSTOSCOPY    . INTRAOPERATIVE TRANSESOPHAGEAL ECHOCARDIOGRAM N/A 07/22/2013   Procedure: INTRAOPERATIVE TRANSESOPHAGEAL ECHOCARDIOGRAM;  Surgeon: Rexene Alberts, MD;  Location: [place];  Service: Open Heart Surgery;  Laterality: N/A;  . LEFT AND RIGHT HEART CATHETERIZATION WITH CORONARY ANGIOGRAM  07/07/2013   Procedure: LEFT AND RIGHT HEART CATHETERIZATION WITH CORONARY ANGIOGRAM;  Surgeon: Thayer Headings, MD;  Location: [facility] CATH LAB;  Service: Cardiovascular;;  . MAZE N/A 07/22/2013   Procedure: MAZE;  Surgeon: Rexene Alberts, MD;  Location: [place];  Service: Open Heart Surgery;  Laterality: N/A;  . MITRAL VALVE REPAIR N/A 07/22/2013  Procedure: MITRAL VALVE REPAIR (MVR);  Surgeon: Rexene Alberts, MD;  Location: [place];  Service: Open Heart Surgery;  Laterality: N/A;  . PATENT FORAMEN OVALE CLOSURE N/A 07/22/2013   Procedure: PATENT FORAMEN OVALE CLOSURE;  Surgeon: Rexene Alberts, MD;  Location: [place];  Service: Open Heart Surgery;  Laterality: N/A;  . TEE WITHOUT CARDIOVERSION N/A 07/08/2013   Procedure: TRANSESOPHAGEAL  ECHOCARDIOGRAM (TEE);  Surgeon: Larey Dresser, MD;  Location: [place];  Service: Cardiovascular;  Laterality: N/A;  . TEE WITHOUT CARDIOVERSION N/A 02/08/2017   Procedure: TRANSESOPHAGEAL ECHOCARDIOGRAM (TEE);  Surgeon: Dorothy Spark, MD;  Location: [facility] ENDOSCOPY;  Service: Cardiovascular;  Laterality: N/A;  . TONSILLECTOMY      Current Medications: Current Meds  Medication Sig  . amoxicillin (AMOXIL) 500 MG capsule TAKE FOUR CAPSULES BY MOUTH ONE HOUR PRIOR TO ALL DENTAL APPOINTMENTS  . cetirizine (ZYRTEC) 10 MG tablet Take 10 mg by mouth daily as needed for allergies.  . sildenafil (REVATIO) 20 MG tablet TAKE FIVE TABLETS BY MOUTH DAILY AS NEEDED  . [DISCONTINUED] benazepril (LOTENSIN) 10 MG tablet TAKE 1 TABLET BY MOUTH EVERY DAY  . [DISCONTINUED] ELIQUIS 5 MG TABS tablet TAKE 1 TABLET BY MOUTH TWICE DAILY  . [DISCONTINUED] fenofibrate (TRICOR) 48 MG tablet Take 1 tablet (48 mg total) by mouth daily.  . [DISCONTINUED] hydrochlorothiazide (HYDRODIURIL) 25 MG tablet TAKE 1 TABLET BY MOUTH EVERY DAY  . [DISCONTINUED] metoprolol tartrate (LOPRESSOR) 25 MG tablet TAKE 1 TABLET BY MOUTH TWICE DAILY  . [DISCONTINUED] rosuvastatin (CRESTOR) 10 MG tablet TAKE 1 TABLET BY MOUTH EVERY DAY     Allergies:   Shellfish allergy, Sulfa antibiotics, and Meperidine and related   Social History   Socioeconomic History  . Marital status: Married    Spouse name: Not on file  . Number of children: Not on file  . Years of education: Not on file  . Highest education level: Not on file  Occupational History  . Occupation: Press photographer  Tobacco Use  . Smoking status: Never Smoker  . Smokeless tobacco: Never Used  Vaping Use  . Vaping Use: Never used  Substance and Sexual Activity  . Alcohol use: Yes    Alcohol/week: 2.0 standard drinks    Types: 2 Standard drinks or equivalent per week  . Drug use: No  . Sexual activity: Yes  Other Topics Concern  . Not on file  Social History Narrative  .  Not on file   Social Determinants of Health   Financial Resource Strain: Not on file  Food Insecurity: Not on file  Transportation Needs: Not on file  Physical Activity: Not on file  Stress: Not on file  Social Connections: Not on file     Family History: The patient's family history includes Heart attack in his father.  ROS:   Please see the history of present illness.    All other systems reviewed and are negative.  EKGs/Labs/Other Studies Reviewed:    The following studies were reviewed today: Echo 04/14/2020: IMPRESSIONS    1. Left ventricular ejection fraction, by estimation, is 55 to 60%. The  left ventricle has normal function. The left ventricle has no regional  wall motion abnormalities. Left ventricular diastolic parameters are  indeterminate.  2. Right ventricular systolic function is normal. The right ventricular  size is normal. There is mildly elevated pulmonary artery systolic  pressure.  3. Left atrial size was moderately dilated.  4. Right atrial size was moderately dilated.  5. Post repair  with 34 mm Sorin Memo ring. trivial residual MR and only 5  mmHg diastolic gradient at high HR of 90 bpm in afib . The mitral valve  has been repaired/replaced. Trivial mitral valve regurgitation. No  evidence of mitral stenosis. There is a  34 mm Sorin memo 3D ring annuloplasty present in the mitral position.  Procedure Date: 07/22/2013.  6. The aortic valve is tricuspid. Aortic valve regurgitation is not  visualized. Mild aortic valve sclerosis is present, with no evidence of  aortic valve stenosis.  7. The inferior vena cava is normal in size with greater than 50%  respiratory variability, suggesting right atrial pressure of 3 mmHg.   EKG:  EKG is ordered today.  The ekg ordered today demonstrates atrial fibrillation 81 bpm, otherwise within normal limits.  Recent Labs: 12/21/2020: ALT 25; BUN 28; Creatinine, Ser 1.42; Hemoglobin 15.0; Platelets 203;  Potassium 3.9; Sodium 137; TSH 3.560  Recent Lipid Panel    Component Value Date/Time   CHOL 138 12/21/2020 0730   TRIG 390 (H) 12/21/2020 0730   HDL 31 (L) 12/21/2020 0730   CHOLHDL 4.5 12/21/2020 0730   CHOLHDL 3.4 06/05/2016 1048   VLDL 50 (H) 06/05/2016 1048   LDLCALC 49 12/21/2020 0730   LDLDIRECT 42.0 04/26/2015 0950     Risk Assessment/Calculations:    CHA2DS2-VASc Score = 4  This indicates a 4.8% annual risk of stroke. The patient's score is based upon: CHF History: Yes HTN History: Yes Diabetes History: No Stroke History: No Vascular Disease History: Yes Age Score: 1 Gender Score: 0      Physical Exam:    VS:  BP (!) 130/94   Pulse 81   Ht 5\' 10"  (1.778 m)   Wt 186 lb 9.6 oz (84.6 kg)   SpO2 98%   BMI 26.77 kg/m     Wt Readings from Last 3 Encounters:  03/13/21 186 lb 9.6 oz (84.6 kg)  04/18/20 190 lb 12.8 oz (86.5 kg)  10/12/19 186 lb 6.4 oz (84.6 kg)     GEN:  Well nourished, well developed in no acute distress HEENT: Normal NECK: No JVD; No carotid bruits LYMPHATICS: No lymphadenopathy CARDIAC: Irregularly irregular, no murmurs, rubs, gallops RESPIRATORY:  Clear to auscultation without rales, wheezing or rhonchi  ABDOMEN: Soft, non-tender, non-distended MUSCULOSKELETAL:  No edema; No deformity  SKIN: Warm and dry NEUROLOGIC:  Alert and oriented x 3 PSYCHIATRIC:  Normal affect   ASSESSMENT:    1. Mixed hyperlipidemia   2. Coronary artery disease involving native coronary artery of native heart without angina pectoris   3. Persistent atrial fibrillation ([place])   4. S/P mitral valve repair   5. Essential hypertension, benign    PLAN:    In order of problems listed above:  1. Treated with fenofibrate and a statin drug.  Reviewed most recent lipids, predating addition of fenofibrate.  Recommended repeat lipids and LFTs.  This will be arranged in the next few months. 2. Doing well on chronic oral anticoagulation.  Treated with a statin drug  as above.  No anginal symptoms. 3. Heart rate controlled.  The patient is elected for rate control strategy.  LV function has been preserved.  I have recommended an echocardiogram prior to his office visit next year. 4. The patient's mitral valve repair site has been intact on follow-up echo.  LVEF is normal. 5. He is experiencing frequent postural hypotension.  Home blood pressure readings are lower than his office readings.  Recommend hold hydrochlorothiazide.  Continue benazepril and metoprolol without change.  He will let us know how he does with this change.  If his blood pressure increases, will consider restarting diuretic therapy or using a different class of medication such as amlodipine.        Medication Adjustments/Labs and Tests Ordered: Current medicines are reviewed at length with the patient today.  Concerns regarding medicines are outlined above.  Orders Placed This Encounter  Procedures  . Comprehensive metabolic panel  . Lipid panel  . EKG 12-Lead   Meds ordered this encounter  Medications  . rosuvastatin (CRESTOR) 10 MG tablet    Sig: Take 1 tablet (10 mg total) by mouth daily.    Dispense:  90 tablet    Refill:  3  . metoprolol tartrate (LOPRESSOR) 25 MG tablet    Sig: Take 1 tablet (25 mg total) by mouth 2 (two) times daily.    Dispense:  180 tablet    Refill:  3  . fenofibrate (TRICOR) 48 MG tablet    Sig: Take 1 tablet (48 mg total) by mouth daily.    Dispense:  90 tablet    Refill:  3  . apixaban (ELIQUIS) 5 MG TABS tablet    Sig: Take 1 tablet (5 mg total) by mouth 2 (two) times daily.    Dispense:  180 tablet    Refill:  3  . benazepril (LOTENSIN) 10 MG tablet    Sig: Take 1 tablet (10 mg total) by mouth daily.    Dispense:  90 tablet    Refill:  3    Patient Instructions  Medication Instructions:  1) STOP HYDROCHLOROTHIAZIDE *If you need a refill on your cardiac medications before your next appointment, please call your pharmacy*  Lab  Work: FASTING LABS: 05/03/21 (anytime between 7:30AM and 4:30PM) If you have labs (blood work) drawn today and your tests are completely normal, you will receive your results only by: Marland Kitchen MyChart Message (if you have MyChart) OR . A paper copy in the mail If you have any lab test that is abnormal or we need to change your treatment, we will call you to review the results.  Follow-Up: You will be called to arrange your 1 year echo and visit.    Signed, Sherren Mocha, MD  03/13/2021 4:59 PM    [facility]

## 2021-03-16 ENCOUNTER — Telehealth: Payer: Self-pay | Admitting: Cardiovascular Disease

## 2021-03-16 NOTE — Telephone Encounter (Signed)
    Pt c/o medication issue:  1. Name of Medication: HYDROCHLOROTHIAZIDE  2. How are you currently taking this medication (dosage and times per day)?   3. Are you having a reaction (difficulty breathing--STAT)?   4. What is your medication issue? Pt would like to speak with RN Valetta Fuller, he said since Dr. Burt Knack stopped his HYDROCHLOROTHIAZIDE his BP been too low and been feeling dizzy. Last night he fell asleep on his lounge chair, when he get up to go to his bedroom his legs felt so weak that he almost fell. She wanted to discuss it with Valetta Fuller what he needs to do. He gave his cellphone # for call back [phone]

## 2021-03-16 NOTE — Telephone Encounter (Signed)
The patient reports he fell asleep in his lounge chair yesterday. When he got up, he got dizzy and felt like he may fall. He did not check his BP at that time but rested and felt better shortly after he sat down. He has felt fine since, with pressures today ranging from 110-120s/60-70s.  Reiterated to the patient that he just stopped HCTZ a couple days ago (he was instructed to stop at 3/21 visit) and it will take a few days to completely exit his system. Since he feels fine now, instructed him to stay hydrated and to change positions slowly, especially when moving from lying/sitting to standing. He will monitor symptoms and if dizziness does not improve with time or if he faints he will call back. He was grateful for call and agrees with plan.

## 2021-04-14 ENCOUNTER — Other Ambulatory Visit: Payer: Self-pay | Admitting: Cardiovascular Disease

## 2021-04-14 NOTE — Telephone Encounter (Signed)
Ok to fill 

## 2021-04-14 NOTE — Telephone Encounter (Signed)
Pt pharmacy is requesting a refill on sildenafil. Would Dr. Burt Knack like to refill this medication? Please address

## 2021-05-03 ENCOUNTER — Other Ambulatory Visit: Payer: BC Managed Care – PPO | Admitting: *Deleted

## 2021-05-03 ENCOUNTER — Other Ambulatory Visit: Payer: Self-pay

## 2021-05-03 DIAGNOSIS — E782 Mixed hyperlipidemia: Secondary | ICD-10-CM

## 2021-05-03 LAB — COMPREHENSIVE METABOLIC PANEL
ALT: 17 IU/L (ref 0–44)
AST: 24 IU/L (ref 0–40)
Albumin/Globulin Ratio: 1.7 (ref 1.2–2.2)
Albumin: 4.3 g/dL (ref 3.7–4.7)
Alkaline Phosphatase: 78 IU/L (ref 44–121)
BUN/Creatinine Ratio: 14 (ref 10–24)
BUN: 21 mg/dL (ref 8–27)
Bilirubin Total: 0.5 mg/dL (ref 0.0–1.2)
CO2: 26 mmol/L (ref 20–29)
Calcium: 9.5 mg/dL (ref 8.6–10.2)
Chloride: 101 mmol/L (ref 96–106)
Creatinine, Ser: 1.49 mg/dL — ABNORMAL HIGH (ref 0.76–1.27)
Globulin, Total: 2.6 g/dL (ref 1.5–4.5)
Glucose: 95 mg/dL (ref 65–99)
Potassium: 4.2 mmol/L (ref 3.5–5.2)
Sodium: 141 mmol/L (ref 134–144)
Total Protein: 6.9 g/dL (ref 6.0–8.5)
eGFR: 49 mL/min/{1.73_m2} — ABNORMAL LOW (ref >59)

## 2021-05-03 LAB — LIPID PANEL
Chol/HDL Ratio: 2.9 ratio (ref 0.0–5.0)
Cholesterol, Total: 112 mg/dL (ref 100–199)
HDL: 39 mg/dL — ABNORMAL LOW (ref >39)
LDL Chol Calc (NIH): 45 mg/dL (ref 0–99)
Triglycerides: 165 mg/dL — ABNORMAL HIGH (ref 0–149)
VLDL Cholesterol Cal: 28 mg/dL (ref 5–40)

## 2021-07-20 ENCOUNTER — Telehealth: Payer: Self-pay | Admitting: Cardiovascular Disease

## 2021-07-20 DIAGNOSIS — Z9889 Other specified postprocedural states: Secondary | ICD-10-CM

## 2021-07-20 DIAGNOSIS — I341 Nonrheumatic mitral (valve) prolapse: Secondary | ICD-10-CM

## 2021-07-20 NOTE — Telephone Encounter (Signed)
Spoke with wife and scheduled echo same day as appt with Dr. Burt Knack.  Advised we will order labs when he sees Dr. Burt Knack if any are needed.  Wife appreciative for call.

## 2021-07-20 NOTE — Telephone Encounter (Signed)
New Message:      Patient's wife wants to know if patient will need lab work and Echo before his next office visit on 03-20-22?

## 2021-10-16 ENCOUNTER — Telehealth: Payer: Self-pay

## 2021-10-16 NOTE — Telephone Encounter (Signed)
**Note De-Identified Henry Page Obfuscation** Eliquis PA started through covermymeds. Key: Henry Page

## 2021-11-07 NOTE — Telephone Encounter (Signed)
**Note De-Identified Imanol Bihl Obfuscation** Lenard Janczak KeyMarguarite Arbour - PA Case ID: [id number] - Rx #: [id number] Need help?  Outcome: Approved on October 24 Type:Prior Auth;Coverage Start Date:10/16/2021;Coverage End Date:10/16/2022; Drug: Eliquis 5MG  tablets Form: [employer] Electronic PA Form (2017 NCPDP) Original Claim Info 64  [facility] is aware of this approval.

## 2021-12-07 ENCOUNTER — Telehealth: Payer: Self-pay | Admitting: Physician Assistant

## 2021-12-07 NOTE — Telephone Encounter (Signed)
Patient patient after hour answering service complaining of elevated blood pressure in the 150s to 160s today.  He is on low-dose benazepril 10 mg daily and metoprolol 25 mg twice a day.  I asked him to take extra dose of benazepril tonight.  Starting tomorrow, he will keep a blood pressure diary with 2 blood pressure readings per day.  Morning blood pressure will be 2 hours after the morning medication and nighttime blood pressure would be at a fixed time every night.  If after 2 weeks, he notices his systolic blood pressures persistently greater than 140s, he has been instructed to call our office and make appointment with Dr. Burt Knack to uptitrate his blood pressure medication more permanently.

## 2022-03-04 ENCOUNTER — Other Ambulatory Visit: Payer: Self-pay | Admitting: Cardiovascular Disease

## 2022-03-20 ENCOUNTER — Other Ambulatory Visit: Payer: Self-pay

## 2022-03-20 ENCOUNTER — Ambulatory Visit (HOSPITAL_COMMUNITY): Payer: Commercial Managed Care - PPO | Attending: Internal Medicine

## 2022-03-20 ENCOUNTER — Encounter: Payer: Self-pay | Admitting: Cardiovascular Disease

## 2022-03-20 ENCOUNTER — Ambulatory Visit (INDEPENDENT_AMBULATORY_CARE_PROVIDER_SITE_OTHER): Payer: Managed Care, Other (non HMO) | Admitting: Cardiovascular Disease

## 2022-03-20 VITALS — BP 140/90 | HR 61 | Ht 70.0 in | Wt 190.4 lb

## 2022-03-20 DIAGNOSIS — I10 Essential (primary) hypertension: Secondary | ICD-10-CM

## 2022-03-20 DIAGNOSIS — I2510 Atherosclerotic heart disease of native coronary artery without angina pectoris: Secondary | ICD-10-CM | POA: Diagnosis not present

## 2022-03-20 DIAGNOSIS — I4819 Other persistent atrial fibrillation: Secondary | ICD-10-CM

## 2022-03-20 DIAGNOSIS — E782 Mixed hyperlipidemia: Secondary | ICD-10-CM

## 2022-03-20 DIAGNOSIS — Z9889 Other specified postprocedural states: Secondary | ICD-10-CM | POA: Diagnosis present

## 2022-03-20 DIAGNOSIS — I340 Nonrheumatic mitral (valve) insufficiency: Secondary | ICD-10-CM | POA: Diagnosis not present

## 2022-03-20 DIAGNOSIS — I341 Nonrheumatic mitral (valve) prolapse: Secondary | ICD-10-CM

## 2022-03-20 LAB — CBC
Hematocrit: 43.7 % (ref 37.5–51.0)
Hemoglobin: 14.9 g/dL (ref 13.0–17.7)
MCH: 32.1 pg (ref 26.6–33.0)
MCHC: 34.1 g/dL (ref 31.5–35.7)
MCV: 94 fL (ref 79–97)
Platelets: 204 10*3/uL (ref 150–450)
RBC: 4.64 x10E6/uL (ref 4.14–5.80)
RDW: 13.1 % (ref 11.6–15.4)
WBC: 6.5 10*3/uL (ref 3.4–10.8)

## 2022-03-20 LAB — LIPID PANEL
Chol/HDL Ratio: 2.7 ratio (ref 0.0–5.0)
Cholesterol, Total: 116 mg/dL (ref 100–199)
HDL: 43 mg/dL (ref >39)
LDL Chol Calc (NIH): 47 mg/dL (ref 0–99)
Triglycerides: 152 mg/dL — ABNORMAL HIGH (ref 0–149)
VLDL Cholesterol Cal: 26 mg/dL (ref 5–40)

## 2022-03-20 LAB — COMPREHENSIVE METABOLIC PANEL
ALT: 16 IU/L (ref 0–44)
AST: 24 IU/L (ref 0–40)
Albumin/Globulin Ratio: 1.7 (ref 1.2–2.2)
Albumin: 4.5 g/dL (ref 3.7–4.7)
Alkaline Phosphatase: 73 IU/L (ref 44–121)
BUN/Creatinine Ratio: 15 (ref 10–24)
BUN: 20 mg/dL (ref 8–27)
Bilirubin Total: 0.5 mg/dL (ref 0.0–1.2)
CO2: 25 mmol/L (ref 20–29)
Calcium: 10 mg/dL (ref 8.6–10.2)
Chloride: 102 mmol/L (ref 96–106)
Creatinine, Ser: 1.37 mg/dL — ABNORMAL HIGH (ref 0.76–1.27)
Globulin, Total: 2.7 g/dL (ref 1.5–4.5)
Glucose: 90 mg/dL (ref 70–99)
Potassium: 4.6 mmol/L (ref 3.5–5.2)
Sodium: 139 mmol/L (ref 134–144)
Total Protein: 7.2 g/dL (ref 6.0–8.5)
eGFR: 54 mL/min/{1.73_m2} — ABNORMAL LOW (ref >59)

## 2022-03-20 LAB — ECHOCARDIOGRAM COMPLETE
Area-P 1/2: 2.36 cm2
MV VTI: 1.23 cm2
S' Lateral: 3.4 cm

## 2022-03-20 NOTE — Progress Notes (Signed)
?Cardiology Office Note:   ? ?Date:  03/20/2022  ? ?ID:  Henry Page, DOB 10/25/1947, MRN [id number] ? ?PCP:  Glenda Chroman, MD ?  ?[facility] Providers ?Cardiologist:  Sherren Mocha, MD    ? ?Referring MD: Glenda Chroman, MD  ? ?Chief Complaint  ?Patient presents with  ? Coronary Artery Disease  ? ? ?History of Present Illness:   ? ?Henry Page is a 76 y.o. male with a hx of mitral valve disease, coronary artery disease, and atrial fibrillation. The patient underwent CABG, mitral valve repair, and maze procedure in 2014.  He developed recurrent atrial fibrillation in 2018 and underwent TEE guided cardioversion at that time.    He developed recurrent atrial fibrillation and was seen in the atrial fibrillation clinic last in 2019.  He declined antiarrhythmic drug therapy at that time.  He then saw Dr. Rayann Heman in consultation in 2020 and again elected to continue with rate control and anticoagulation. ? ?The patient is here alone today.  He is feeling well.  He denies symptoms of chest pain, shortness of breath, heart palpitations.  He has occasional dizziness when he first stands up.  He continues to work, after briefly retiring but then coming back to the company when they ask him back.  He still enjoys what he does.  States that he feels best when he is working.  He is tolerating his medicines well with no specific complaints today other than as outlined above. ? ?Past Medical History:  ?Diagnosis Date  ? Anxiety   ? Atrial fibrillation ([place]) 07/06/2013  ? CKD (chronic kidney disease), stage III ([place])   ? Essential hypertension, benign 07/06/2013  ? Kidney stones   ? some passed spontaneous, one retrieved by cystoscope   ? 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  ? LIMA to LAD, Sequential SVG to intermediate and OM1, SVG to RCA, EVH via right thigh  ? S/P Maze operation for atrial fibrillation 07/22/2013  ? Complete bilateral atrial lesion set using bipolar radiofrequency and cryothermy ablation with clipping of LA appendage   ?  S/P mitral valve repair 07/22/2013  ? Complex valvuloplasty including triangular resection of posterior leaflet with artificial Gore-tex neocord placement x2 and 59m Sorin Memo 3D ring annuloplasty  ? Sleep apnea   ? ? ?Past Surgical History:  ?Procedure Laterality Date  ? CARDIOVERSION N/A 02/08/2017  ? Procedure: CARDIOVERSION;  Surgeon: KDorothy Spark MD;  [place]  Service: Cardiovascular;  Laterality: N/A;  ? CARDIOVERSION N/A 10/10/2018  ? Procedure: CARDIOVERSION;  Surgeon: CSanda Klein MD;  [place]  Service: Cardiovascular;  Laterality: N/A;  ? CORONARY ARTERY BYPASS GRAFT N/A 07/22/2013  ? Procedure: CORONARY ARTERY BYPASS GRAFTING (CABG);  Surgeon: CRexene Alberts MD;  [place]  Service: Open Heart Surgery;  Laterality: N/A;  ? CYSTOSCOPY    ? INTRAOPERATIVE TRANSESOPHAGEAL ECHOCARDIOGRAM N/A 07/22/2013  ? Procedure: INTRAOPERATIVE TRANSESOPHAGEAL ECHOCARDIOGRAM;  Surgeon: CRexene Alberts MD;  [place]  Service: Open Heart Surgery;  Laterality: N/A;  ? LEFT AND RIGHT HEART CATHETERIZATION WITH CORONARY ANGIOGRAM  07/07/2013  ? Procedure: LEFT AND RIGHT HEART CATHETERIZATION WITH CORONARY ANGIOGRAM;  Surgeon: PThayer Headings MD;  [facility];  Service: Cardiovascular;;  ? MAZE N/A 07/22/2013  ? Procedure: MAZE;  Surgeon: CRexene Alberts MD;  [place]  Service: Open Heart Surgery;  Laterality: N/A;  ? MITRAL VALVE REPAIR N/A 07/22/2013  ? Procedure: MITRAL VALVE REPAIR (MVR);  Surgeon: Rexene Alberts, MD;  Location: [place];  Service: Open Heart Surgery;  Laterality: N/A;  ? PATENT FORAMEN OVALE CLOSURE N/A 07/22/2013  ? Procedure: PATENT FORAMEN OVALE CLOSURE;  Surgeon: Rexene Alberts, MD;  Location: [place];  Service: Open Heart Surgery;  Laterality: N/A;  ? TEE WITHOUT CARDIOVERSION N/A 07/08/2013  ? Procedure: TRANSESOPHAGEAL ECHOCARDIOGRAM (TEE);  Surgeon: Larey Dresser, MD;  Location: [place];  Service: Cardiovascular;   Laterality: N/A;  ? TEE WITHOUT CARDIOVERSION N/A 02/08/2017  ? Procedure: TRANSESOPHAGEAL ECHOCARDIOGRAM (TEE);  Surgeon: Dorothy Spark, MD;  Location: [facility] ENDOSCOPY;  Service: Cardiovascular;  Laterality: N/A;  ? TONSILLECTOMY    ? ? ?Current Medications: ?Current Meds  ?Medication Sig  ? amoxicillin (AMOXIL) 500 MG capsule TAKE FOUR CAPSULES BY MOUTH ONE HOUR PRIOR TO ALL DENTAL APPOINTMENTS  ? apixaban (ELIQUIS) 5 MG TABS tablet Take 1 tablet (5 mg total) by mouth 2 (two) times daily.  ? benazepril (LOTENSIN) 10 MG tablet Take 1 tablet (10 mg total) by mouth daily. Please keep upcoming appt for future refills.  ? cetirizine (ZYRTEC) 10 MG tablet Take 10 mg by mouth daily as needed for allergies.  ? fenofibrate (TRICOR) 48 MG tablet Take 1 tablet (48 mg total) by mouth daily. Please keep upcoming appt for future refills.  ? metoprolol tartrate (LOPRESSOR) 25 MG tablet TAKE 1 TABLET BY MOUTH TWICE DAILY  ? rosuvastatin (CRESTOR) 10 MG tablet Take 1 tablet (10 mg total) by mouth daily. Please keep upcoming appt for future refills.  ? sildenafil (REVATIO) 20 MG tablet TAKE FIVE TABLETS BY MOUTH DAILY AS NEEDED  ?  ? ?Allergies:   Shellfish allergy, Sulfa antibiotics, and Meperidine and related  ? ?Social History  ? ?Socioeconomic History  ? Marital status: Married  ?  Spouse name: Not on file  ? Number of children: Not on file  ? Years of education: Not on file  ? Highest education level: Not on file  ?Occupational History  ? Occupation: Press photographer  ?Tobacco Use  ? Smoking status: Never  ? Smokeless tobacco: Never  ?Vaping Use  ? Vaping Use: Never used  ?Substance and Sexual Activity  ? Alcohol use: Yes  ?  Alcohol/week: 2.0 standard drinks  ?  Types: 2 Standard drinks or equivalent per week  ? Drug use: No  ? Sexual activity: Yes  ?Other Topics Concern  ? Not on file  ?Social History Narrative  ? Not on file  ? ?Social Determinants of Health  ? ?Financial Resource Strain: Not on file  ?Food Insecurity: Not on file   ?Transportation Needs: Not on file  ?Physical Activity: Not on file  ?Stress: Not on file  ?Social Connections: Not on file  ?  ? ?Family History: ?The patient's family history includes Heart attack in his father. ? ?ROS:   ?Please see the history of present illness.    ?All other systems reviewed and are negative. ? ?EKGs/Labs/Other Studies Reviewed:   ? ?The following studies were reviewed today: ?Today's echo images are personally reviewed.  The formal interpretation is pending.  On my review, he appears to have normal LV systolic function.  There appears to be normal function of his mitral valve status post mitral annuloplasty with trivial mitral regurgitation and a mean transmitral gradient of 6 mmHg. ? ?EKG:  EKG is ordered today.  The ekg ordered today demonstrates atrial fibrillation 61 bpm, otherwise normal ? ?Recent Labs: ?05/03/2021: ALT 17; BUN 21; Creatinine, Ser 1.49; Potassium  4.2; Sodium 141  ?Recent Lipid Panel ?   ?Component Value Date/Time  ? CHOL 112 05/03/2021 0851  ? TRIG 165 (H) 05/03/2021 0851  ? HDL 39 (L) 05/03/2021 0851  ? CHOLHDL 2.9 05/03/2021 0851  ? CHOLHDL 3.4 06/05/2016 1048  ? VLDL 50 (H) 06/05/2016 1048  ? [place] 45 05/03/2021 0851  ? LDLDIRECT 42.0 04/26/2015 0950  ? ? ? ?Risk Assessment/Calculations:   ?  ? ?    ? ?Physical Exam:   ? ?VS:  BP 140/90   Pulse 61   Ht '5\' 10"'$  (1.778 m)   Wt 190 lb 6.4 oz (86.4 kg)   SpO2 98%   BMI 27.32 kg/m?    ? ?Wt Readings from Last 3 Encounters:  ?03/20/22 190 lb 6.4 oz (86.4 kg)  ?03/13/21 186 lb 9.6 oz (84.6 kg)  ?04/18/20 190 lb 12.8 oz (86.5 kg)  ?  ? ?GEN: Well nourished, well developed in no acute distress ?HEENT: Normal ?NECK: No JVD; No carotid bruits ?LYMPHATICS: No lymphadenopathy ?CARDIAC: Irregularly irregular, no murmurs, rubs, gallops ?RESPIRATORY:  Clear to auscultation without rales, wheezing or rhonchi  ?ABDOMEN: Soft, non-tender, non-distended ?MUSCULOSKELETAL:  No edema; No deformity  ?SKIN: Warm and dry ?NEUROLOGIC:   Alert and oriented x 3 ?PSYCHIATRIC:  Normal affect  ? ?ASSESSMENT:   ? ?1. Mixed hyperlipidemia   ?2. Coronary artery disease involving native coronary artery of native heart without angina pectoris   ?3. Essential hyp

## 2022-03-20 NOTE — Patient Instructions (Signed)
Medication Instructions:  ?Your physician recommends that you continue on your current medications as directed. Please refer to the Current Medication list given to you today. ? ?*If you need a refill on your cardiac medications before your next appointment, please call your pharmacy* ? ? ?Lab Work: ?CMET, LIPIDS, CBC Today ?If you have labs (blood work) drawn today and your tests are completely normal, you will receive your results only by: ?MyChart Message (if you have MyChart) OR ?A paper copy in the mail ?If you have any lab test that is abnormal or we need to change your treatment, we will call you to review the results. ? ? ?Testing/Procedures: ?NONE ? ? ?Follow-Up: ?At [facility], you and your health needs are our priority.  As part of our continuing mission to provide you with exceptional heart care, we have created designated Provider Care Teams.  These Care Teams include your primary Cardiologist (physician) and Advanced Practice Providers (APPs -  Physician Assistants and Nurse Practitioners) who all work together to provide you with the care you need, when you need it. ? ?Your next appointment:   ?1 year(s) ? ?The format for your next appointment:   ?In Person ? ?Provider:   ?Sherren Mocha, MD   ? ? ?Other Instructions ?  ?

## 2022-04-02 ENCOUNTER — Other Ambulatory Visit: Payer: Self-pay | Admitting: Cardiovascular Disease

## 2022-04-02 NOTE — Telephone Encounter (Signed)
Eliquis 5 mg refill request received. Patient is 76 years old, weight- 86.4 kg, Crea- 1.37 on Dr. Burt Knack, Diagnosis- afib, and last seen by Dr. Burt Knack on 03/20/22. Dose is appropriate based on dosing criteria. Will send in refill to requested pharmacy.   ?

## 2022-04-12 ENCOUNTER — Other Ambulatory Visit: Payer: Self-pay

## 2022-04-16 ENCOUNTER — Telehealth: Payer: Self-pay | Admitting: Cardiovascular Disease

## 2022-04-16 ENCOUNTER — Other Ambulatory Visit: Payer: Self-pay | Admitting: Cardiovascular Disease

## 2022-04-16 NOTE — Telephone Encounter (Signed)
Eliquis '5mg'$  refill request received. Patient is 76 years old, weight-86.4kg, Crea-1.37 on 03/20/2022, Diagnosis-Afib, and last seen by Dr. Burt Knack on 03/20/2022. Dose is appropriate based on dosing criteria. The last refill was sent on 04/03/2022 and the pharmacist Tye Maryland stated do have it.  ? ?Tye Maryland, pharmacist at [facility] drug stated they are seeing that the cost of the medication is $550/month supply so they sent over a prior auth for this on 04/12/2022. Will send this to [facility], patient advocate to assist with.  ?

## 2022-04-16 NOTE — Telephone Encounter (Signed)
Pt c/o medication issue: ? ?1. Name of Medication:  ?apixaban (ELIQUIS) 5 MG TABS tablet ? ?2. How are you currently taking this medication (dosage and times per day)?  ?As prescribed  ? ?3. Are you having a reaction (difficulty breathing--STAT)?  ? ?4. What is your medication issue?  ? ?Patient's wife states the patient switched insurance and [facility] ([place], [place]) will need a PA prior to distributing the medication. ? ? ?

## 2022-04-16 NOTE — Telephone Encounter (Signed)
*  STAT* If patient is at the pharmacy, call can be transferred to refill team. ? ? ?1. Which medications need to be refilled? (please list name of each medication and dose if known)  ?apixaban (ELIQUIS) 5 MG TABS tablet ? ?2. Which pharmacy/location (including street and city if local pharmacy) is medication to be sent to? ?[facility], [place] ? ?3. Do they need a 30 day or 90 day supply?  ? ?90 day supply ? ?Per patient's wife, pharmacy does not have 4/10 Rx. ?

## 2022-04-17 MED ORDER — APIXABAN 5 MG PO TABS
5.0000 mg | ORAL_TABLET | Freq: Two times a day (BID) | ORAL | 1 refills | Status: DC
Start: 2022-04-17 — End: 2022-07-12

## 2022-04-17 NOTE — Telephone Encounter (Signed)
**Note De-Identified Duell Holdren Obfuscation** Suanne Marker, the pts wife and DPR is advised that I called [facility] and attempted to do a Eliquis PA but was advised by [place] that a PA is not required for the pts Eliquis but that they need the pt to contact them as they are in need of information from him and then the RX can be filled. ? ?I did provide Suanne Marker with the phone number to contact [place] at [phone] and she states that she will contact them today. ? ?She thanked me for my assistance. ?

## 2022-04-17 NOTE — Telephone Encounter (Signed)
Follow Up: ? ? ? ? ? ?Patient's wife is calling to check on the status of patient's prior authorization for his Eliquis.Marland KitchenMarland Kitchen ?

## 2022-05-30 ENCOUNTER — Telehealth: Payer: Medicare Other | Admitting: Family Medicine

## 2022-05-30 ENCOUNTER — Other Ambulatory Visit: Payer: Self-pay | Admitting: Cardiovascular Disease

## 2022-05-30 ENCOUNTER — Telehealth: Payer: Self-pay | Admitting: Cardiovascular Disease

## 2022-05-30 DIAGNOSIS — Z79899 Other long term (current) drug therapy: Secondary | ICD-10-CM

## 2022-05-30 NOTE — Progress Notes (Signed)
[place]   Needs to talk to cardiologist about medication adjustments with upcoming insurance changes. Unable to provide changes to his medications.  Patient acknowledged agreement and understanding of the plan.

## 2022-05-30 NOTE — Telephone Encounter (Signed)
Pt c/o medication issue:  1. Name of Medication: apixaban (ELIQUIS) 5 MG TABS tablet  2. How are you currently taking this medication (dosage and times per day)? As prescribed  3. Are you having a reaction (difficulty breathing--STAT)? No  4. What is your medication issue? Wife is calling stating this medication is going to cost $600 to fill with the pt being on medicaid. Requesting a callback to discuss alternatives.

## 2022-06-01 NOTE — Telephone Encounter (Signed)
**Note De-Identified Ellaina Schuler Obfuscation** The pts wife Suanne Marker states that they have switched to a new ins plan and that the pts Eliquis cost is no longer an issue as they can afford.  I did give her [employer]'s phone number and encouraged her to call them to ask questions about the pts eligibility to be approved for their Eliquis program as if he is approved he will receive Eliquis free of charge from them for the rest of this year. She is aware to request that they mail them an application to their home address if it appears that he is eligible for approval and that once they receive the application to complete his part of it, obtain required documents per [employer], and to bring all to Dr York Cerise office at [address] in [place] and that we will take care of the providers page and will fax all to [employer].  She verbalized understanding and thanked me for calling her back.

## 2022-07-12 ENCOUNTER — Other Ambulatory Visit: Payer: Self-pay

## 2022-07-12 DIAGNOSIS — I480 Paroxysmal atrial fibrillation: Secondary | ICD-10-CM

## 2022-07-12 MED ORDER — APIXABAN 5 MG PO TABS: 5.0000 mg | ORAL_TABLET | Freq: Two times a day (BID) | ORAL | 1 refills | Status: DC

## 2022-07-12 NOTE — Telephone Encounter (Signed)
Prescription refill request for Eliquis received. Indication: Afib  Last office visit: 03/20/22 Burt Knack) Scr: 1.37 (03/20/22) Age: 76 Weight: 86.4kg  Appropriate dose and refill sent to requested pharmacy.

## 2022-07-13 ENCOUNTER — Other Ambulatory Visit: Payer: Self-pay | Admitting: *Deleted

## 2022-07-13 DIAGNOSIS — I480 Paroxysmal atrial fibrillation: Secondary | ICD-10-CM

## 2022-07-13 MED ORDER — APIXABAN 5 MG PO TABS: 5.0000 mg | ORAL_TABLET | Freq: Two times a day (BID) | ORAL | 2 refills | Status: DC

## 2022-07-13 NOTE — Telephone Encounter (Signed)
Eliquis '5mg'$  refill request received. Patient is 77 years old, weight-86.4kg, Crea-1.37 on 03/20/2022, Diagnosis-Afib, and last seen by Dr. Burt Knack on 03/20/2022. Dose is appropriate based on dosing criteria. Will send in refill to requested pharmacy.

## 2022-08-02 ENCOUNTER — Other Ambulatory Visit: Payer: Self-pay

## 2022-08-02 NOTE — Telephone Encounter (Signed)
Pt's wife calling requesting a refill on sildenafil. Would Dr. Burt Knack like to refill this medication? Please address

## 2022-08-03 ENCOUNTER — Telehealth: Payer: Self-pay | Admitting: Cardiovascular Disease

## 2022-08-03 NOTE — Telephone Encounter (Signed)
*  STAT* If patient is at the pharmacy, call can be transferred to refill team.   1. Which medications need to be refilled? (please list name of each medication and dose if known)   sildenafil (REVATIO) 20 MG tablet   2. Which pharmacy/location (including street and city if local pharmacy) is medication to be sent to?[facility], [place] - [address]  3. Do they need a 30 day or 90 day supply? [place]

## 2022-08-03 NOTE — Telephone Encounter (Signed)
Follow Up:     Patient's wife is checking on the status of patient's refill for his Sildenafil.

## 2022-08-03 NOTE — Telephone Encounter (Signed)
Follow Up:     Patient wife is calling again. SHe said the patinet needs his medicine for the weekend please.

## 2022-08-06 MED ORDER — SILDENAFIL CITRATE 20 MG PO TABS
ORAL_TABLET | ORAL | 5 refills | Status: AC
Start: 2022-08-06 — End: ?

## 2022-08-06 NOTE — Telephone Encounter (Signed)
Already sent in to pharmacy on file

## 2022-08-06 NOTE — Telephone Encounter (Signed)
Sherren Mocha, MD to Wonda Horner, CMA  Me      08/04/22 12:39 PM This is fine to refill thanks    Medication sent to [facility] drug per request.

## 2022-08-07 ENCOUNTER — Other Ambulatory Visit: Payer: Self-pay | Admitting: Cardiovascular Disease

## 2022-10-17 DIAGNOSIS — L918 Other hypertrophic disorders of the skin: Secondary | ICD-10-CM | POA: Diagnosis not present

## 2022-10-17 DIAGNOSIS — D485 Neoplasm of uncertain behavior of skin: Secondary | ICD-10-CM | POA: Diagnosis not present

## 2022-10-17 DIAGNOSIS — C44519 Basal cell carcinoma of skin of other part of trunk: Secondary | ICD-10-CM | POA: Diagnosis not present

## 2022-10-17 DIAGNOSIS — L570 Actinic keratosis: Secondary | ICD-10-CM | POA: Diagnosis not present

## 2022-11-08 DIAGNOSIS — C44519 Basal cell carcinoma of skin of other part of trunk: Secondary | ICD-10-CM | POA: Diagnosis not present

## 2023-01-07 ENCOUNTER — Other Ambulatory Visit: Payer: Self-pay | Admitting: Cardiovascular Disease

## 2023-01-07 DIAGNOSIS — I480 Paroxysmal atrial fibrillation: Secondary | ICD-10-CM

## 2023-01-07 NOTE — Telephone Encounter (Signed)
Prescription refill request for Eliquis received. Indication:afib Last office visit:3/23 Scr:1.3 Age: 77 Weight:86.4  kg  Prescription refilled

## 2023-01-14 ENCOUNTER — Emergency Department (HOSPITAL_COMMUNITY): Payer: Medicare Other

## 2023-01-14 ENCOUNTER — Emergency Department (HOSPITAL_COMMUNITY)
Admission: EM | Admit: 2023-01-14 | Discharge: 2023-01-14 | Disposition: A | Discharge status: Home / Self Care | Payer: Medicare Other | Attending: Emergency Medicine | Admitting: Emergency Medicine

## 2023-01-14 ENCOUNTER — Other Ambulatory Visit: Payer: Self-pay

## 2023-01-14 DIAGNOSIS — R413 Other amnesia: Secondary | ICD-10-CM | POA: Diagnosis not present

## 2023-01-14 DIAGNOSIS — N189 Chronic kidney disease, unspecified: Secondary | ICD-10-CM | POA: Diagnosis not present

## 2023-01-14 DIAGNOSIS — Z7901 Long term (current) use of anticoagulants: Secondary | ICD-10-CM | POA: Insufficient documentation

## 2023-01-14 DIAGNOSIS — I4891 Unspecified atrial fibrillation: Secondary | ICD-10-CM | POA: Insufficient documentation

## 2023-01-14 DIAGNOSIS — G319 Degenerative disease of nervous system, unspecified: Secondary | ICD-10-CM | POA: Diagnosis not present

## 2023-01-14 DIAGNOSIS — R410 Disorientation, unspecified: Secondary | ICD-10-CM | POA: Diagnosis present

## 2023-01-14 DIAGNOSIS — G9389 Other specified disorders of brain: Secondary | ICD-10-CM | POA: Diagnosis not present

## 2023-01-14 DIAGNOSIS — G454 Transient global amnesia: Secondary | ICD-10-CM | POA: Diagnosis not present

## 2023-01-14 DIAGNOSIS — J3489 Other specified disorders of nose and nasal sinuses: Secondary | ICD-10-CM | POA: Diagnosis not present

## 2023-01-14 DIAGNOSIS — Z79899 Other long term (current) drug therapy: Secondary | ICD-10-CM | POA: Insufficient documentation

## 2023-01-14 DIAGNOSIS — I2510 Atherosclerotic heart disease of native coronary artery without angina pectoris: Secondary | ICD-10-CM | POA: Insufficient documentation

## 2023-01-14 DIAGNOSIS — I129 Hypertensive chronic kidney disease with stage 1 through stage 4 chronic kidney disease, or unspecified chronic kidney disease: Secondary | ICD-10-CM | POA: Insufficient documentation

## 2023-01-14 LAB — COMPREHENSIVE METABOLIC PANEL
ALT: 20 U/L (ref 0–44)
AST: 27 U/L (ref 15–41)
Albumin: 4.2 g/dL (ref 3.5–5.0)
Alkaline Phosphatase: 63 U/L (ref 38–126)
Anion gap: 12 (ref 5–15)
BUN: 23 mg/dL (ref 8–23)
CO2: 24 mmol/L (ref 22–32)
Calcium: 9.2 mg/dL (ref 8.9–10.3)
Chloride: 102 mmol/L (ref 98–111)
Creatinine, Ser: 1.44 mg/dL — ABNORMAL HIGH (ref 0.61–1.24)
GFR, Estimated: 50 mL/min — ABNORMAL LOW (ref >60)
Glucose, Bld: 89 mg/dL (ref 70–99)
Potassium: 4.1 mmol/L (ref 3.5–5.1)
Sodium: 138 mmol/L (ref 135–145)
Total Bilirubin: 0.9 mg/dL (ref 0.3–1.2)
Total Protein: 7.8 g/dL (ref 6.5–8.1)

## 2023-01-14 LAB — CBC
HCT: 45.7 % (ref 39.0–52.0)
Hemoglobin: 15.1 g/dL (ref 13.0–17.0)
MCH: 32.6 pg (ref 26.0–34.0)
MCHC: 33 g/dL (ref 30.0–36.0)
MCV: 98.7 fL (ref 80.0–100.0)
Platelets: 201 10*3/uL (ref 150–400)
RBC: 4.63 MIL/uL (ref 4.22–5.81)
RDW: 13.1 % (ref 11.5–15.5)
WBC: 7.4 10*3/uL (ref 4.0–10.5)
nRBC: 0 % (ref 0.0–0.2)

## 2023-01-14 LAB — CBG MONITORING, ED: Glucose-Capillary: 98 mg/dL (ref 70–99)

## 2023-01-14 NOTE — ED Provider Notes (Signed)
[facility] Provider Note   CSN: [id number] Arrival date & time: 01/14/23  1636     History  Chief Complaint  Patient presents with   Altered Mental Status    Henry Page is a 78 y.o. male.   Altered Mental Status Presenting symptoms: confusion   Patient presents for transient episode of confusion.  Medical history includes HTN, CAD, CKD, HLD, atrial fibrillation.  He also reports that he has had chronic sinus issues.  Earlier today, mediately prior to this episode, he had a sensation of a rush into his sinus area.  He states that he has had this sensation before.  This "rash" only lasts for few seconds.  It was after this episode today that he had forgotten that he dropped his wife off at her appointment.  When he talked her on the phone, she had to explain to him how to get back.  Patient has no history of dementia and this is very atypical for him.  Patient returned to mental baseline shortly thereafter.  He has not had any symptoms since that time.  This episode occurred at approximately 2:30 PM.     Home Medications Prior to Admission medications   Medication Sig Start Date End Date Taking? Authorizing Provider  amoxicillin (AMOXIL) 500 MG capsule Take 1,000 mg by mouth as needed (for pt's allergies). 01/18/19  Yes [provider]  benazepril (LOTENSIN) 10 MG tablet TAKE 1 TABLET BY MOUTH DAILY 05/30/22  Yes Sherren Mocha, MD  cetirizine (ZYRTEC) 10 MG tablet Take 10 mg by mouth daily as needed for allergies.   Yes [provider]  Coenzyme Q10 (CO Q 10) 100 MG CAPS Take 1 capsule by mouth daily.   Yes [provider]  ELIQUIS 5 MG TABS tablet TAKE 1 TABLET BY MOUTH TWICE DAILY 01/07/23  Yes Sherren Mocha, MD  fenofibrate (TRICOR) 48 MG tablet TAKE 1 TABLET BY MOUTH DAILY 05/30/22  Yes Sherren Mocha, MD  magnesium oxide (MAG-OX) 400 (240 Mg) MG tablet Take 400 mg by mouth daily.   Yes [provider]  metoprolol tartrate (LOPRESSOR) 25 MG tablet TAKE 1 TABLET BY MOUTH TWICE DAILY 03/06/22  Yes Sherren Mocha, MD  Multiple Vitamin (MULTIVITAMIN) tablet Take 1 tablet by mouth daily.   Yes [provider]  rosuvastatin (CRESTOR) 10 MG tablet TAKE 1 TABLET BY MOUTH DAILY 05/30/22  Yes Sherren Mocha, MD  sildenafil (REVATIO) 20 MG tablet Take 1-5 tablets by mouth daily as needed 08/06/22  Yes Sherren Mocha, MD      Allergies    Shellfish allergy, Sulfa antibiotics, and Meperidine and related    Review of Systems   Review of Systems  HENT:  Positive for sinus pressure.   Psychiatric/Behavioral:  Positive for confusion.   All other systems reviewed and are negative.   Physical Exam Updated Vital Signs BP (!) 165/98   Pulse 80   Temp 98 F (36.7 C) (Oral)   Resp 13   Ht '5\' 10"'$  (1.778 m)   Wt 83.9 kg   SpO2 97%   BMI 26.54 kg/m  Physical Exam Vitals and nursing note reviewed.  Constitutional:      General: He is not in acute distress.    Appearance: Normal appearance. He is well-developed. He is not ill-appearing, toxic-appearing or diaphoretic.  HENT:     Head: Normocephalic and atraumatic.     Right Ear: External ear normal.     Left  Ear: External ear normal.     Nose: Nose normal.     Right Sinus: No maxillary sinus tenderness or frontal sinus tenderness.     Left Sinus: No maxillary sinus tenderness or frontal sinus tenderness.     Mouth/Throat:     Mouth: Mucous membranes are moist.  Eyes:     General: No visual field deficit.    Extraocular Movements: Extraocular movements intact.     Conjunctiva/sclera: Conjunctivae normal.  Cardiovascular:     Rate and Rhythm: Normal rate. Rhythm irregular.     Heart sounds: No murmur heard. Pulmonary:     Effort: Pulmonary effort is normal. No respiratory distress.  Abdominal:     General: There is no distension.     Palpations: Abdomen is soft.     Tenderness: There is no abdominal tenderness.   Musculoskeletal:        General: No swelling. Normal range of motion.     Cervical back: Normal range of motion and neck supple.     Right lower leg: No edema.     Left lower leg: No edema.  Skin:    General: Skin is warm and dry.     Capillary Refill: Capillary refill takes less than 2 seconds.     Coloration: Skin is not jaundiced or pale.  Neurological:     General: No focal deficit present.     Mental Status: He is alert and oriented to person, place, and time.     Cranial Nerves: Cranial nerves 2-12 are intact. No cranial nerve deficit, dysarthria or facial asymmetry.     Sensory: Sensation is intact. No sensory deficit.     Motor: Motor function is intact. No weakness, abnormal muscle tone or pronator drift.     Coordination: Coordination is intact. Coordination normal. Finger-Nose-Finger Test normal.     Gait: Gait is intact. Gait normal.  Psychiatric:        Mood and Affect: Mood normal.        Behavior: Behavior normal.        Thought Content: Thought content normal.        Judgment: Judgment normal.     ED Results / Procedures / Treatments   Labs (all labs ordered are listed, but only abnormal results are displayed) Labs Reviewed  COMPREHENSIVE METABOLIC PANEL - Abnormal; Notable for the following components:      Result Value   Creatinine, Ser 1.44 (*)    GFR, Estimated 50 (*)    All other components within normal limits  CBC  CBG MONITORING, ED    EKG None  Radiology MR BRAIN WO CONTRAST  Result Date: 01/14/2023 CLINICAL DATA:  Provided history: Transient ischemic attack. EXAM: MRI HEAD WITHOUT CONTRAST TECHNIQUE: Multiplanar, multiecho pulse sequences of the brain and surrounding structures were obtained without intravenous contrast. COMPARISON:  No pertinent prior exams available for comparison. FINDINGS: Mild intermittent motion degradation. Brain: Mild generalized parenchymal atrophy. 13 mm T2 hyperintense lesion within the right aspect of the sella (for  instance as seen on series 14, image 18). There is possible, but not definite, extension into the right cavernous sinus. Multifocal T2 FLAIR hyperintense signal abnormality within the cerebral white matter, nonspecific but compatible with moderate chronic small vessel ischemic disease. 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 Background prominent perivascular spaces within the bilateral basal ganglia. There is no acute infarct. No chronic intracranial blood products. No extra-axial fluid collection. No midline shift. Vascular: Maintained flow voids within the proximal  large arterial vessels. Dominant right vertebral artery. Skull and upper cervical spine: No focal suspicious marrow lesion. Incompletely assessed cervical spondylosis. Sinuses/Orbits: No mass or acute finding within the imaged orbits. Complete T2 hyperintense opacification of the right maxillary sinus. This may reflect mucosal thickening, fluid and/or a mucous retention cyst. Minimal mucosal thickening scattered within bilateral ethmoid air cells. IMPRESSION: 1. No evidence of acute infarct. 2. 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 There is possible, but not definite, extension into the right cavernous sinus. Referral for endocrine and/or neurosurgical management is recommended. This follows ACR consensus guidelines: Management of Incidental Pituitary Findings on CT, MRI and F18-FDG PET: A White Paper of the ACR Incidental Findings Committee. J Am Coll Radiol 2018; 15: 132-44. 3. Moderate chronic small vessel ischemic changes within the cerebral white matter. 4. 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 5. Mild generalized parenchymal atrophy. 6. Paranasal sinus disease, as described. Electronically Signed   By: Kellie Simmering D.O.   On: 01/14/2023 18:38    Procedures Procedures    Medications Ordered in ED Medications - No data to display  ED  Course/ Medical Decision Making/ A&P                             Medical Decision Making Amount and/or Complexity of Data Reviewed Labs: ordered. Radiology: ordered.   This patient presents to the ED for concern of transient amnesia, this involves an extensive number of treatment options, and is a complaint that carries with it a high risk of complications and morbidity.  The differential diagnosis includes TGA, TIA, seizure, hypoglycemia   Co morbidities that complicate the patient evaluation  HTN, CAD, CKD, HLD, atrial fibrillation   Additional history obtained:  Additional history obtained from patient's wife External records from outside source obtained and reviewed including EMR  Cardiac Monitoring: / EKG:  The patient was maintained on a cardiac monitor.  I personally viewed and interpreted the cardiac monitored which showed an underlying rhythm of: Atrial fibrillation   Problem List / ED Course / Critical interventions / Medication management  Patient presents after a transient episode of confusion earlier today.  This episode occurred at 2:30 PM.  On his arrival in the ED, he is asymptomatic.  Patient was evaluated in ED triage.  He has no focal neurologic deficits.  He is alert and oriented.  He does remember the episode but does not remember feeling confused.  Per his wife, he needed to be explained that she was at the appointment that he had just dropped her off at.  Presentation is concerning for possible TIA.  Patient underwent MRI imaging.  Basic lab work was ordered.  On reassessment, patient remains asymptomatic.  No evidence of CVA was identified on MRI.  There was an incidental finding of a 13 mm lesion in right aspect of sella.  Radiology noted possible, but not definite, extension into right cavernous sinus.  Patient has no evidence of cranial nerve dysfunction.  He was informed of this finding on MRI.  He was advised to follow-up with his primary care doctor for  possible further testing and reimaging.  Patient's lab work shows baseline creatinine, normal electrolytes, normal hemoglobin, no leukocytosis, and normal glucose.   Social Determinants of Health:  Has access to outpatient care        Final Clinical Impression(s) / ED  Diagnoses Final diagnoses:  Transient amnesia    Rx / DC Orders ED Discharge Orders     None         Godfrey Pick, MD 01/14/23 2110

## 2023-01-14 NOTE — Discharge Instructions (Addendum)
The following was seen on your MRI: -"13 mm T2 hyperintense lesion within the right aspect of the sella, which may reflect a cyst or pituitary adenoma. There is possible, but not definite, extension into the right cavernous Sinus" -This is something to make your primary care doctor aware of.  The pituitary gland regulates hormones.  Your PCP may want to do hormone testing.  Your PCP will also likely want to do repeat imaging to ensure that this is not increasing in size.    This finding is unlikely related to your episode today.  In terms of your episode today, you can follow-up with a neurologist for further evaluation of possible TIA or seizure.  Their telephone number is below.  Please return to the emergency department for any recurrence of concerning episodes.

## 2023-01-14 NOTE — ED Triage Notes (Signed)
Pt presents with lapse in memory that started around 1430 hrs, per wife he dropped her off at appointment around 1400, per the pt he had a lapse and remembering what he did this afternoon and feeling of rush in his head.

## 2023-01-17 ENCOUNTER — Encounter: Payer: Self-pay | Admitting: Physician Assistant

## 2023-01-17 ENCOUNTER — Telehealth: Payer: Self-pay | Admitting: Physician Assistant

## 2023-01-17 ENCOUNTER — Ambulatory Visit: Payer: Medicare Other

## 2023-01-17 ENCOUNTER — Ambulatory Visit (INDEPENDENT_AMBULATORY_CARE_PROVIDER_SITE_OTHER): Payer: Medicare Other | Admitting: Physician Assistant

## 2023-01-17 ENCOUNTER — Telehealth: Payer: Self-pay

## 2023-01-17 VITALS — BP 172/107 | HR 85 | Resp 18 | Ht 70.0 in | Wt 190.0 lb

## 2023-01-17 DIAGNOSIS — R0989 Other specified symptoms and signs involving the circulatory and respiratory systems: Secondary | ICD-10-CM | POA: Diagnosis not present

## 2023-01-17 DIAGNOSIS — I480 Paroxysmal atrial fibrillation: Secondary | ICD-10-CM | POA: Diagnosis not present

## 2023-01-17 DIAGNOSIS — R404 Transient alteration of awareness: Secondary | ICD-10-CM | POA: Diagnosis not present

## 2023-01-17 DIAGNOSIS — E237 Disorder of pituitary gland, unspecified: Secondary | ICD-10-CM | POA: Diagnosis not present

## 2023-01-17 NOTE — Telephone Encounter (Signed)
        Patient  visited [facility] on 01/14/2023  for Transient amnesia.   Telephone encounter attempt :  1st  A HIPAA compliant voice message was left requesting a return call.  Instructed patient to call back at [phone].   [place] Resource Care Guide   ??[email or web address]  ?? [phone]   Website: [email or web address]  [email or web address]

## 2023-01-17 NOTE — Progress Notes (Signed)
Assessment/Plan:    The patient is seen in neurologic consultation at the request of Godfrey Pick, MD for the evaluation of memory.  Henry Page is a very pleasant 77 y.o. year old RH male with  a history of hypertension, hyperlipidemia, persistent atrial fibrillation on Eliquis,  status post Maze operation, CAD status post CABG, status post MV repair, CKD seen today for evaluation of transient alteration of awareness.  At the ED, he was worked up for possible TIA, undergoing MRI imaging, and basic lab work, all negative for CVA.  Of note, there was an incidental 13 mm lesion in the right aspect of the sella, with possible but no definite extension into the right cavernous sinus.  Also seen, mild generalized parenchymal atrophy, and a chronic lacunar infarct within the left basal ganglia and moderate chronic microvascular disease. Of note, his BP is very elevated today, has been advised to follow with PCP.  Etiology of his transient alteration of awareness is unclear at this time, and further workup is pending.   Transient alteration of awareness Incidental pituitary lesion  Recommend carotid ultrasound giving his abnormal MRI findings, soft right carotid bruit on exam, known atrial fibrillation Referral to Endocrinology for evaluation of pituitary abnormalities Referral to Neurosurgery for evaluation of pituitary lesion EEG to rule out any seizure activity [place] up in 1 months to further discuss the results Recommend good control of cardiovascular risk factors, patient has been informed of very elevated blood pressure today.  Follow-up with cardiology Continue Eliquis as directed by cardiology  Subjective:   Timeline of events: Around 2:30 PM, on 01/14/2023 the patient was in his usual state of health, "I felt sinus pressure, like a rush ", preceding an episode of what he describes as confusion.  Apparently, he had dropped his wife off, and after coming home, he did not recall any other  events, including calling his wife on the phone (she reports that he was not making a lot of sense), and even when picking her up, after going back home, his wife said that he appears confused "he was not himself, he did not remember how to get to the place ".  She took him to the ER, by 4 PM, he was symptom-free, back to his baseline. Repeatedly asking questions such as "What am I doing here?" or "How did we get here?" NO  Gradual return of memory? NO. "It returned at once" seen by a witness? Endorsed, after picking his wife Normal cognition, such as the ability to recognize and name familiar objects and follow simple directions? NO  Retaining personal identity during the episode? " I don't remember" Able to complete complex routine tasks during the episode? Endorsed Episodes end in 1-8 hours, no more than 24? 1 h  Anxiety and agitation? NO  Any sudden exposure to very hot or very cold water? NO  Any recent emotional stress?  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  The company is out of [place].  He was in [place] when wife broke her wrist and may have provoked stress.  The last time that he had a day trip was on December 13.  unable to move an arm or leg, movements you can't control, or problems ? NO  understanding words? Wife says NO Any history of head injuries? R frontal region about 10 years ago, falling on a piece of metal, without loss of consciousness.    Any history of seizures prior  or during the period of transient alteration of awareness? NO  Any history of migraines? NO  Hallucinations:   visual distortions or auditory hallucinations? NO  Taste of blood or metallic taste? NO Diplopia or other visual changes? One episode but can't remember when cleared with ophthalmology " History of encephalitis or meningitis? Possible h/o meningitis as a child . 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 NO Any history of memory difficulties? NO    Denies any history of TIA or symptomatic stroke. dysarthria or dysphagia. NO . Denies any chest pain, or shortness of breath. NO  Denies any fever or chills, or night sweats. NO  tobacco.NO  No new meds or hormonal supplements.takes Viagra  ETOH? 2 drinks most nights scotch Caffeine? NO  Recreational Drugs?NO Takes Eliquis daily since 7 y ago after the Valve repair  Denies any recent long distance trips or recent surgeries. NO  No sick contacts. NO  Patient is compliant with his medications. Yes   No family history of stroke  NO  Urinary or Bowel Incontinence : NO  Difficulty Swallowing ?NO  Trouble with ADL's?  Trouble buttoning clothing? NO    Sleep:  How many hours? 2 times a night he wakes up to go to urinate               Rested upon waking up? 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 There is possible, but not definite, extension into the right cavernous sinus. 3. Moderate chronic small vessel ischemic changes within the cerebral white matter. 4. Chronic lacunar infarct within the left basal ganglia.5. Mild generalized parenchymal atrophy. 6. Paranasal sinus disease, as described.     Allergies  Allergen Reactions   Shellfish Allergy Other (See Comments)    flulike symptoms    Sulfa Antibiotics Other (See Comments)    "very sick"   Meperidine And Related Itching    Current Outpatient Medications  Medication Instructions   amoxicillin (AMOXIL) 1,000 mg, As needed   benazepril (LOTENSIN) 10 MG tablet TAKE 1 TABLET BY MOUTH DAILY   cetirizine (ZYRTEC) 10 mg, Oral, Daily PRN   Coenzyme Q10 (CO Q 10) 100 MG CAPS 1 capsule, Oral, Daily   Eliquis 5 mg, Oral, 2 times daily   fenofibrate (TRICOR) 48 MG tablet TAKE 1 TABLET BY MOUTH DAILY   magnesium oxide (MAG-OX) 400 mg, Oral, Daily   metoprolol  tartrate (LOPRESSOR) 25 MG tablet TAKE 1 TABLET BY MOUTH TWICE DAILY   Multiple Vitamin (MULTIVITAMIN) tablet 1 tablet, Oral, Daily   rosuvastatin (CRESTOR) 10 MG tablet TAKE 1 TABLET BY MOUTH DAILY   sildenafil (REVATIO) 20 MG tablet Take 1-5 tablets by mouth daily as needed      Latest Ref Rng & Units 01/14/2023    6:21 PM 03/20/2022   12:01 PM 12/21/2020    7:30 AM  CBC  WBC 4.0 - 10.5 K/uL 7.4  6.5  9.2   Hemoglobin 13.0 - 17.0 g/dL 15.1  14.9  15.0   Hematocrit 39.0 - 52.0 % 45.7  43.7  43.3   Platelets 150 - 400 K/uL 201  204  203         Latest Ref Rng & Units 01/14/2023    6:21 PM 03/20/2022   12:01 PM 05/03/2021  8:51 AM  CMP  Glucose 70 - 99 mg/dL 89  90  95   BUN 8 - 23 mg/dL '23  20  21   '$ Creatinine 0.61 - 1.24 mg/dL 1.44  1.37  1.49   Sodium 135 - 145 mmol/L 138  139  141   Potassium 3.5 - 5.1 mmol/L 4.1  4.6  4.2   Chloride 98 - 111 mmol/L 102  102  101   CO2 22 - 32 mmol/L '24  25  26   '$ Calcium 8.9 - 10.3 mg/dL 9.2  10.0  9.5   Total Protein 6.5 - 8.1 g/dL 7.8  7.2  6.9   Total Bilirubin 0.3 - 1.2 mg/dL 0.9  0.5  0.5   Alkaline Phos 38 - 126 U/L 63  73  78   AST 15 - 41 U/L '27  24  24   '$ ALT 0 - 44 U/L '20  16  17      '$ VITALS:   Vitals:   01/17/23 0741  BP: (!) 172/107  Pulse: 85  Resp: 18  SpO2: 96%  Weight: 190 lb (86.2 kg)  Height: '5\' 10"'$  (1.778 m)       No data to display          PHYSICAL EXAM   HEENT:  Normocephalic, atraumatic. The mucous membranes are moist. The superficial temporal arteries are without ropiness or tenderness. Cardiovascular: Regular rate and rhythm. Lungs: Clear to auscultation bilaterally. Neck: There is a very soft right carotid bruit, none on the left.  NEUROLOGICAL:     No data to display              No data to display           Orientation:  Alert and oriented to person, place and time. No aphasia or dysarthria. Fund of knowledge is appropriate. Recent memory and remote memory intact.  Attention and  concentration are normal.  Able to name objects and repeat phrases.   Cranial nerves: There is good facial symmetry. Extraocular muscles are intact and visual fields are full to confrontational testing. Speech is fluent and clear. no tongue deviation. Hearing is intact to conversational tone. Tone: Tone is good throughout. Sensation: Sensation is intact to light touch and pinprick throughout. Vibration is intact at the bilateral big toe.There is no extinction with double simultaneous stimulation. There is no sensory dermatomal level identified. Coordination: The patient has no difficulty with RAM's or FNF bilaterally. Normal finger to nose  Motor: Strength is 5/5 in the bilateral upper and lower extremities. There is no pronator drift. There are no fasciculations noted. DTR's: Deep tendon reflexes are 2/4 at the bilateral biceps, triceps, brachioradialis, patella and achilles.  Plantar responses are downgoing bilaterally. Gait and Station: The patient is able to ambulate without difficulty.The patient is able to ambulate in a tandem fashion, able to stand in the Romberg position.     Thank you for allowing [place] the opportunity to participate in the care of this nice patient. Please do not hesitate to contact us for any questions or concerns.   Total time spent on today's visit was 61 minutes dedicated to this patient today, preparing to see patient, examining the patient, ordering tests and/or medications and counseling the patient, documenting clinical information in the EHR or other health record, independently interpreting results and communicating results to the patient/family, discussing treatment and goals, answering patient's questions and coordinating care.  Cc:  Glenda Chroman, MD  Sharene Butters  01/17/2023 11:24 AM

## 2023-01-17 NOTE — Patient Instructions (Addendum)
Carotid Ultrasound Referral to Endocrine   Referral to Neurosurgery EEG for transient alteration of awareness Follow up in 1 month   [place] for Cartoid [place] EEG will be performed here at [facility] Neurology Follow up with [facility] Referral to [facility]

## 2023-01-17 NOTE — Telephone Encounter (Signed)
Patient wife called and wants to know why patient needed to see Dre Cabbell please call

## 2023-01-21 ENCOUNTER — Telehealth: Payer: Self-pay

## 2023-01-21 DIAGNOSIS — G454 Transient global amnesia: Secondary | ICD-10-CM | POA: Diagnosis not present

## 2023-01-21 DIAGNOSIS — D497 Neoplasm of unspecified behavior of endocrine glands and other parts of nervous system: Secondary | ICD-10-CM | POA: Diagnosis not present

## 2023-01-21 NOTE — Telephone Encounter (Signed)
     Patient  visit on 01/14/2023  at [facility] was for Transient amnesia.  Have you been able to follow up with your primary care physician? Yes  The patient was or was not able to obtain any needed medicine or equipment. No medication prescribed.  Are there diet recommendations that you are having difficulty following? No  Patient expresses understanding of discharge instructions and education provided has no other needs at this time.    [facility] Resource Care Guide   [email or web address]  ?? [phone]   Website: [email or web address]  [email or web address]

## 2023-01-25 ENCOUNTER — Ambulatory Visit
Admission: RE | Admit: 2023-01-25 | Discharge: 2023-01-25 | Disposition: A | Discharge status: Home / Self Care | Payer: Medicare Other | Source: Ambulatory Visit | Attending: Physician Assistant | Admitting: Physician Assistant

## 2023-01-25 DIAGNOSIS — I6523 Occlusion and stenosis of bilateral carotid arteries: Secondary | ICD-10-CM | POA: Diagnosis not present

## 2023-01-25 DIAGNOSIS — R0989 Other specified symptoms and signs involving the circulatory and respiratory systems: Secondary | ICD-10-CM

## 2023-01-25 DIAGNOSIS — I480 Paroxysmal atrial fibrillation: Secondary | ICD-10-CM

## 2023-01-26 ENCOUNTER — Other Ambulatory Visit: Payer: Self-pay | Admitting: Cardiovascular Disease

## 2023-01-27 NOTE — Progress Notes (Signed)
Dopplers of the carotid are with  minimal narrowing, follow up with PCP, no intervention is indicated at this time Recommend good control of cardiovascular risk factors.

## 2023-01-28 ENCOUNTER — Ambulatory Visit: Payer: Medicare Other | Admitting: Neurology

## 2023-01-28 DIAGNOSIS — R404 Transient alteration of awareness: Secondary | ICD-10-CM | POA: Diagnosis not present

## 2023-01-28 NOTE — Progress Notes (Signed)
EEG complete - results pending 

## 2023-02-04 ENCOUNTER — Other Ambulatory Visit: Payer: Self-pay | Admitting: Cardiovascular Disease

## 2023-02-13 NOTE — Procedures (Signed)
ELECTROENCEPHALOGRAM REPORT  Date of Study: 01/28/2023  Patient's Name: Henry Page MRN: [id number] Date of Birth: 1946-03-09  Referring Provider: Sharene Butters, PA-C  Clinical History: This is a 77 year old man with a transient episode of confusion/memory loss. EEG for classification.  Medications: Lotensin, Crestor, Metoprolol, Eliquis, Tricor  Technical Summary: A multichannel digital 1-hour EEG recording measured by the international 10-20 system with electrodes applied with paste and impedances below 5000 ohms performed in our laboratory with EKG monitoring in an awake and asleep patient.  Hyperventilation was not performed. Photic stimulation was performed.  The digital EEG was referentially recorded, reformatted, and digitally filtered in a variety of bipolar and referential montages for optimal display.    Description: The patient is awake and asleep during the recording.  During maximal wakefulness, there is a symmetric, medium voltage 10 Hz posterior dominant rhythm that attenuates with eye opening.  The record is symmetric.  During drowsiness and sleep, there is an increase in theta slowing of the background.  Vertex waves and symmetric sleep spindles were seen. There was a run of sharp transients over the right frontopolar region that appear artifactual. Photic stimulation did not elicit any abnormalities.  There were no clear epileptiform discharges or electrographic seizures seen.    EKG lead was unremarkable.  Impression: This 1-hour awake and asleep EEG is within normal limits.  Clinical Correlation: A normal EEG does not exclude a clinical diagnosis of epilepsy.  If further clinical questions remain, prolonged EEG may be helpful.  Clinical correlation is advised.   Ellouise Newer, M.D.

## 2023-02-14 NOTE — Progress Notes (Signed)
The EEG is normal, does not show any seizures.  Thank you

## 2023-02-19 ENCOUNTER — Telehealth: Payer: Self-pay | Admitting: Cardiovascular Disease

## 2023-02-19 ENCOUNTER — Ambulatory Visit (INDEPENDENT_AMBULATORY_CARE_PROVIDER_SITE_OTHER): Payer: Medicare Other | Admitting: Physician Assistant

## 2023-02-19 VITALS — BP 178/111 | HR 65 | Resp 18 | Ht 70.0 in | Wt 185.0 lb

## 2023-02-19 DIAGNOSIS — R030 Elevated blood-pressure reading, without diagnosis of hypertension: Secondary | ICD-10-CM | POA: Diagnosis not present

## 2023-02-19 DIAGNOSIS — R404 Transient alteration of awareness: Secondary | ICD-10-CM

## 2023-02-19 NOTE — Telephone Encounter (Signed)
Patient seen by neurology today and concerned for high blood pressure, per patient, was advised to come to our office for examination. Pt states pressure while there was 178/111 with no symptoms. Pt seen there for evaluation post TIA, but no signs of neurological impairment and no planned follow-up. Patient asymptomatic at time of evaluation. Denies vision changes/loss, headaches, gait issues. Able to ambulate at checkout with no difficulties, speaking in full, clear sentences. Wife present at time and will be with him remainder of day. Advised that he take additional metoprolol when he gets home and recheck BP two hours later. Asked to repeat check BP at home tonight and bring both these readings, along with home BP cuff to appointment tomorrow with Dr Burt Knack. Patient states home BP readings range [id number] [phone] diastolic.he will bring home BP cuff with him. ED precautions given.

## 2023-02-19 NOTE — Progress Notes (Signed)
Assessment/Plan:      Henry Page is a very pleasant 77 y.o. RH male with  a history of hypertension, hyperlipidemia, persistent atrial fibrillation on Eliquis,  status post Maze operation, CAD status post CABG, status post MV repair, CKD seen today in follow up after  an episode of transient alteration of awareness on 01/14/23 .  As recalled, at the ED, he was worked up for possible TIA, undergoing MRI imaging, and basic lab work, all negative for CVA.  Of note, there was an incidental 13 mm lesion in the right aspect of the sella, with possible but no definite extension into the right cavernous sinus.  Also seen, mild generalized parenchymal atrophy, and a chronic lacunar infarct within the left basal ganglia and moderate chronic microvascular disease.    Since his last visit, he had a carotid ultrasound, with minimal bilateral carotid artery disease.  EEG was negative for seizures.  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 .  Etiology of his transient alteration of awareness is unclear, however, his BP is very elevated, and in view of all his cardiovascular risk factors including a remote lacunar stroke, and significant chronic microvascular disease, patient may be at risk for TIA. Additionally, he has daytime somnolence,  and sinus disease which is to be addressed as OP by PCP .     Transient alteration of awareness of unclear etiology  Recommend good control of cardiovascular risk factors.  Contact Cardiology immediately  for management of labile, very elevated BP, today at 196/120, and 171/111, as he is at risk for a stroke with these values . He is currently asymptomatic . He is going to be seen today. He was instructed otherwise to seek UC or ED  Recommend sleep study Recommend ENT for  sinus disease  Recommend CBT for anxiety  Follow up in 1 year.     Subjective:    This patient is accompanied in the office by his wife who supplements the history.  Previous records as well as any outside records available were reviewed prior to todays visit.    Any amnestic episodes since last visit ? Denies.  Got Covid and was foggy brain , has been under stress .     Initial evaluation 01/17/2023  Timeline of events: Around 2:30 PM, on 01/14/2023 the patient was in his usual state of health, "I felt sinus pressure, like a rush ", preceding an episode of what he describes as confusion.  Apparently, he had dropped his wife off, and after coming home, he did not recall any other events, including calling his wife on the phone (she reports that he was not making a lot of sense), and even when picking her up, after going back home, his wife said that he appears confused "he was not himself, he did not remember how to get to the place ".  She took him to the ER, by 4 PM, he was symptom-free, back to his baseline. Repeatedly asking questions such as "What am I doing here?" or "How did we get here?" NO  Gradual return of memory? NO. "It returned at once" seen by a witness? Endorsed, after picking his wife Normal cognition, such as the ability to recognize and name familiar objects and follow simple directions? NO  Retaining personal identity during the episode? "  I don't remember" Able to complete complex routine tasks during the episode? Endorsed Episodes end in 1-8 hours, no more than 24? 1 h  Anxiety and agitation? NO  Any sudden exposure to very hot or very cold water? NO  Any recent emotional stress?  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  He was in [place] when wife broke her wrist and may have provoked stress.  The last time that he had a day trip was on December 13.  unable to move an arm or leg, movements you can't control, or  problems ? NO  understanding words? Wife says NO Any history of head injuries? R frontal region about 10 years ago, falling on a piece of metal, without loss of consciousness.    Any history of seizures prior or during the period of transient alteration of awareness? NO  Any history of migraines? NO  Hallucinations:              visual distortions or auditory hallucinations? NO  Taste of blood or metallic taste? NO Diplopia or other visual changes? One episode but can't remember when cleared with ophthalmology " History of encephalitis or meningitis? Possible h/o meningitis as a child . 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 NO Any history of memory difficulties? NO   Denies any history of TIA or symptomatic stroke. dysarthria or dysphagia. NO . Denies any chest pain, or shortness of breath. NO  Denies any fever or chills, or night sweats. NO  tobacco.NO  No new meds or hormonal supplements.takes Viagra  ETOH? 2 drinks most nights scotch Caffeine? NO  Recreational Drugs?NO Takes Eliquis daily since 7 y ago after the Valve repair  Denies any recent long distance trips or recent surgeries. NO  No sick contacts. NO  Patient is compliant with his medications. Yes   No family history of stroke  NO   Urinary or Bowel Incontinence : NO  Difficulty Swallowing ?NO  Trouble with ADL's?             Trouble buttoning clothing? NO    Sleep:  How many hours? 2 times a night he wakes up to go to urinate               Rested upon waking up? 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 There is possible, but not definite, extension into the right cavernous sinus. 3. Moderate chronic small vessel ischemic changes within the cerebral white matter. 4. Chronic lacunar infarct within the left basal  ganglia.5. Mild generalized parenchymal atrophy. 6. Paranasal sinus disease, as described.  MRI brain was remarkable for   CURRENT MEDICATIONS:  Outpatient Encounter Medications as of 02/19/2023  Medication Sig   apixaban (ELIQUIS) 5 MG TABS tablet Take by mouth.   benazepril (LOTENSIN) 10 MG tablet TAKE 1 TABLET BY MOUTH DAILY   cetirizine (ZYRTEC) 10 MG tablet Take 10 mg by mouth daily as needed for allergies.   Coenzyme Q10 (CO Q 10) 100 MG CAPS Take 1 capsule by mouth daily.   ELIQUIS 5 MG TABS tablet TAKE 1 TABLET BY MOUTH TWICE DAILY   fenofibrate (TRICOR) 48 MG tablet TAKE 1 TABLET BY MOUTH DAILY   magnesium  oxide (MAG-OX) 400 (240 Mg) MG tablet Take 400 mg by mouth daily.   metoprolol tartrate (LOPRESSOR) 25 MG tablet TAKE 1 TABLET BY MOUTH TWICE DAILY   Multiple Vitamin (MULTIVITAMIN) tablet Take 1 tablet by mouth daily.   rosuvastatin (CRESTOR) 10 MG tablet TAKE 1 TABLET BY MOUTH DAILY   sildenafil (REVATIO) 20 MG tablet Take 1-5 tablets by mouth daily as needed   amoxicillin (AMOXIL) 500 MG capsule Take 1,000 mg by mouth as needed (for pt's allergies). (Patient not taking: Reported on 02/19/2023)   No facility-administered encounter medications on file as of 02/19/2023.        No data to display             No data to display         Thank you for allowing [place] the opportunity to participate in the care of this nice patient. Please do not hesitate to contact us for any questions or concerns.   Total time spent on today's visit was 31 minutes dedicated to this patient today, preparing to see patient, examining the patient, ordering tests and/or medications and counseling the patient, documenting clinical information in the EHR or other health record, independently interpreting results and communicating results to the patient/family, discussing treatment and goals, answering patient's questions and coordinating care.  Cc:  Glenda Chroman, MD  Sharene Butters 02/19/2023 10:43  AM

## 2023-02-19 NOTE — Patient Instructions (Signed)
Follow up in 1 year  Please follow with Cards regarding the very elevated blood pressure Follow up with ENT re: sinus disease and possible sleep issues

## 2023-02-20 ENCOUNTER — Telehealth: Payer: Self-pay | Admitting: Cardiovascular Disease

## 2023-02-20 ENCOUNTER — Ambulatory Visit: Payer: Medicare Other | Attending: Cardiovascular Disease | Admitting: Cardiovascular Disease

## 2023-02-20 ENCOUNTER — Encounter: Payer: Self-pay | Admitting: Cardiovascular Disease

## 2023-02-20 VITALS — BP 156/102 | HR 85 | Ht 70.0 in | Wt 187.6 lb

## 2023-02-20 DIAGNOSIS — I4819 Other persistent atrial fibrillation: Secondary | ICD-10-CM

## 2023-02-20 DIAGNOSIS — I2510 Atherosclerotic heart disease of native coronary artery without angina pectoris: Secondary | ICD-10-CM

## 2023-02-20 DIAGNOSIS — I10 Essential (primary) hypertension: Secondary | ICD-10-CM | POA: Diagnosis not present

## 2023-02-20 DIAGNOSIS — E782 Mixed hyperlipidemia: Secondary | ICD-10-CM | POA: Diagnosis not present

## 2023-02-20 DIAGNOSIS — Z9889 Other specified postprocedural states: Secondary | ICD-10-CM

## 2023-02-20 MED ORDER — BENAZEPRIL HCL 20 MG PO TABS: 20.0000 mg | ORAL_TABLET | Freq: Every day | ORAL | 3 refills | Status: DC

## 2023-02-20 MED ORDER — METOPROLOL TARTRATE 50 MG PO TABS: 50.0000 mg | ORAL_TABLET | Freq: Two times a day (BID) | ORAL | 3 refills | Status: DC

## 2023-02-20 NOTE — Telephone Encounter (Signed)
Patient is following up from appointment today, requesting to speak directly with Dr. Antionette Char nurse to report BP readings. He states he would like to discuss medication changes as well.

## 2023-02-20 NOTE — Patient Instructions (Signed)
Medication Instructions:  INCREASE Benazepril to '20mg'$  daily INCREASE Metoprolol Tartrate to '50mg'$  twice daily *If you need a refill on your cardiac medications before your next appointment, please call your pharmacy*   Lab Work: BMET in 4 weeks If you have labs (blood work) drawn today and your tests are completely normal, you will receive your results only by: [place] (if you have MyChart) OR A paper copy in the mail If you have any lab test that is abnormal or we need to change your treatment, we will call you to review the results.   Testing/Procedures: Ambulatory Referral to PharmD clinic (HTN) in 4 weeks   Follow-Up: At [facility], you and your health needs are our priority.  As part of our continuing mission to provide you with exceptional heart care, we have created designated Provider Care Teams.  These Care Teams include your primary Cardiologist (physician) and Advanced Practice Providers (APPs -  Physician Assistants and Nurse Practitioners) who all work together to provide you with the care you need, when you need it.  We recommend signing up for the patient portal called "MyChart".  Sign up information is provided on this After Visit Summary.  MyChart is used to connect with patients for Virtual Visits (Telemedicine).  Patients are able to view lab/test results, encounter notes, upcoming appointments, etc.  Non-urgent messages can be sent to your provider as well.   To learn more about what you can do with MyChart, go to [email or web address].    Your next appointment:   6 month(s)  Provider:   Sherren Mocha, MD

## 2023-02-20 NOTE — Progress Notes (Signed)
Cardiology Office Note:    Date:  02/20/2023   ID:  Henry Page, DOB 29-Dec-1945, MRN [id number]  PCP:  Glenda Chroman, MD   [place] Providers Cardiologist:  Sherren Mocha, MD     Referring MD: Glenda Chroman, MD   Chief Complaint  Patient presents with   Atrial Fibrillation    History of Present Illness:    Henry Page is a 77 y.o. male with a hx of mitral valve disease, coronary artery disease, and atrial fibrillation. The patient underwent CABG, mitral valve repair, and maze procedure in 2014.  He developed recurrent atrial fibrillation in 2018 and underwent TEE guided cardioversion at that time.    He developed recurrent atrial fibrillation and was seen in the atrial fibrillation clinic last in 2019.  He declined antiarrhythmic drug therapy at that time.  He then saw Dr. Rayann Heman in consultation in 2020 and again elected to continue with rate control and anticoagulation. He is here with his wife today. He had an episode of 'transient global amnesia' about 1 month ago. He underwent evaluation in the emergency room at [facility].  I was able to review his lab work and MRI results today.  He has followed up with neurology.  At the time of his follow-up, he was noted to be severely hypertensive.  Patient states that he has had a significant amount of anxiety since this episode last month.  His blood pressure has been running very high.  He states that even before that he was typically running greater than 140/90 mmHg.  He has been compliant with benazepril and metoprolol at the current doses.  He denies chest pain, chest pressure, shortness of breath, or heart palpitations.  He otherwise has felt well and has had no recurrent neurologic symptoms.  Past Medical History:  Diagnosis Date   Anxiety    Atrial fibrillation ([place]) 07/06/2013   CKD (chronic kidney disease), stage III ([place])    Essential hypertension, benign 07/06/2013   Kidney stones    some passed spontaneous,  one retrieved by cystoscope    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   LIMA to LAD, Sequential SVG to intermediate and OM1, SVG to RCA, EVH via right thigh   S/P Maze operation for atrial fibrillation 07/22/2013   Complete bilateral atrial lesion set using bipolar radiofrequency and cryothermy ablation with clipping of LA appendage    S/P mitral valve repair 07/22/2013   Complex valvuloplasty including triangular resection of posterior leaflet with artificial Gore-tex neocord placement x2 and 92m Sorin Memo 3D ring annuloplasty   Sleep apnea     Past Surgical History:  Procedure Laterality Date   CARDIOVERSION N/A 02/08/2017   Procedure: CARDIOVERSION;  Surgeon: KDorothy Spark MD;  [place]  Service: Cardiovascular;  Laterality: N/A;   CARDIOVERSION N/A 10/10/2018   Procedure: CARDIOVERSION;  Surgeon: CSanda Klein MD;  [place]  Service: Cardiovascular;  Laterality: N/A;   CORONARY ARTERY BYPASS GRAFT N/A 07/22/2013   Procedure: CORONARY ARTERY BYPASS GRAFTING (CABG);  Surgeon: CRexene Alberts MD;  [place]  Service: Open Heart Surgery;  Laterality: N/A;   CYSTOSCOPY     INTRAOPERATIVE TRANSESOPHAGEAL ECHOCARDIOGRAM N/A 07/22/2013   Procedure: INTRAOPERATIVE TRANSESOPHAGEAL ECHOCARDIOGRAM;  Surgeon: CRexene Alberts MD;  [place]  Service: Open Heart Surgery;  Laterality: N/A;   LEFT AND RIGHT HEART CATHETERIZATION WITH CORONARY ANGIOGRAM  07/07/2013   Procedure: LEFT AND RIGHT HEART CATHETERIZATION WITH CORONARY ANGIOGRAM;  Surgeon: Thayer Headings, MD;  Location: [facility] C[facility] LAB;  Service: Cardiovascular;;   MAZE N/A 07/22/2013   Procedure: MAZE;  Surgeon: Rexene Alberts, MD;  Location: [place];  Service: Open Heart Surgery;  Laterality: N/A;   MITRAL VALVE REPAIR N/A 07/22/2013   Procedure: MITRAL VALVE REPAIR (MVR);  Surgeon: Rexene Alberts, MD;  Location: [place];  Service: Open Heart Surgery;  Laterality: N/A;   PATENT FORAMEN OVALE CLOSURE N/A 07/22/2013    Procedure: PATENT FORAMEN OVALE CLOSURE;  Surgeon: Rexene Alberts, MD;  Location: [place];  Service: Open Heart Surgery;  Laterality: N/A;   TEE WITHOUT CARDIOVERSION N/A 07/08/2013   Procedure: TRANSESOPHAGEAL ECHOCARDIOGRAM (TEE);  Surgeon: Larey Dresser, MD;  Location: [place];  Service: Cardiovascular;  Laterality: N/A;   TEE WITHOUT CARDIOVERSION N/A 02/08/2017   Procedure: TRANSESOPHAGEAL ECHOCARDIOGRAM (TEE);  Surgeon: Dorothy Spark, MD;  Location: [facility] ENDOSCOPY;  Service: Cardiovascular;  Laterality: N/A;   TONSILLECTOMY      Current Medications: Current Meds  Medication Sig   amoxicillin (AMOXIL) 500 MG capsule Take 1,000 mg by mouth as needed (for pt's allergies).   apixaban (ELIQUIS) 5 MG TABS tablet Take by mouth.   benazepril (LOTENSIN) 20 MG tablet Take 1 tablet (20 mg total) by mouth daily.   cetirizine (ZYRTEC) 10 MG tablet Take 10 mg by mouth daily as needed for allergies.   Coenzyme Q10 (CO Q 10) 100 MG CAPS Take 1 capsule by mouth daily.   ELIQUIS 5 MG TABS tablet TAKE 1 TABLET BY MOUTH TWICE DAILY   fenofibrate (TRICOR) 48 MG tablet TAKE 1 TABLET BY MOUTH DAILY   magnesium oxide (MAG-OX) 400 (240 Mg) MG tablet Take 400 mg by mouth daily.   metoprolol tartrate (LOPRESSOR) 50 MG tablet Take 1 tablet (50 mg total) by mouth 2 (two) times daily.   Multiple Vitamin (MULTIVITAMIN) tablet Take 1 tablet by mouth daily.   rosuvastatin (CRESTOR) 10 MG tablet TAKE 1 TABLET BY MOUTH DAILY   sildenafil (REVATIO) 20 MG tablet Take 1-5 tablets by mouth daily as needed   [DISCONTINUED] benazepril (LOTENSIN) 10 MG tablet TAKE 1 TABLET BY MOUTH DAILY   [DISCONTINUED] metoprolol tartrate (LOPRESSOR) 25 MG tablet TAKE 1 TABLET BY MOUTH TWICE DAILY     Allergies:   Shellfish allergy, Sulfa antibiotics, Meperidine and related, and Misc. sulfonamide containing compounds   Social History   Socioeconomic History   Marital status: Married    Spouse name: Not on file   Number of  children: Not on file   Years of education: Not on file   Highest education level: Not on file  Occupational History   Occupation: sales  Tobacco Use   Smoking status: Never   Smokeless tobacco: Never  Vaping Use   Vaping Use: Never used  Substance and Sexual Activity   Alcohol use: Yes    Alcohol/week: 2.0 standard drinks of alcohol    Types: 2 Standard drinks or equivalent per week   Drug use: No   Sexual activity: Yes  Other Topics Concern   Not on file  Social History Narrative   Drinks caffeine   Retired   Lives with wife   Social Determinants of Radio broadcast assistant Strain: Not on file  Food Insecurity: Not on file  Transportation Needs: Not on file  Physical Activity: Not on file  Stress: Not on file  Social Connections: Not on file     Family History: The patient's family  history includes Heart attack in his father.  ROS:   Please see the history of present illness.    All other systems reviewed and are negative.  EKGs/Labs/Other Studies Reviewed:    The following studies were reviewed today: MRI Brain 01/14/2023: IMPRESSION: 1. No evidence of acute infarct. 2. 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 There is possible, but not definite, extension into the right cavernous sinus. Referral for endocrine and/or neurosurgical management is recommended. This follows ACR consensus guidelines: Management of Incidental Pituitary Findings on CT, MRI and F18-FDG PET: A White Paper of the ACR Incidental Findings Committee. J Am Coll Radiol 2018; [id number]. 3. Moderate chronic small vessel ischemic changes within the cerebral white matter. 4. 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 5. Mild generalized parenchymal atrophy. 6. Paranasal sinus disease, as described.  Echo 03/20/2022:  1. Left ventricular ejection fraction, by estimation, is 55 to 60%. The  left  ventricle has normal function. The left ventricle has no regional  wall motion abnormalities. There is mild left ventricular hypertrophy.  Left ventricular diastolic parameters  are indeterminate.   2. Right ventricular systolic function is normal. The right ventricular  size is moderately enlarged. There is normal pulmonary artery systolic  pressure.   3. Left atrial size was moderately dilated.   4. Right atrial size was moderately dilated.   5. MV mean gradient 5 mmHg HR 68 bpm. MV peak gradient 15 mmhg. . Mild  mitral valve regurgitation. There is a Complex valvuloplasty including  triangular resection of posterior leaflet with artificial Gore-tex neocord  placement x2 and 53m Sorin Memo 3D  ring annuloplasty present in the mitral position. Procedure Date:  07/22/2013.   6. The aortic valve is normal in structure. Aortic valve regurgitation is  not visualized.   7. 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   8. The inferior vena cava is normal in size with greater than 50%  respiratory variability, suggesting right atrial pressure of 3 mmHg.   Comparison(s): No significant change from prior study.    EKG:  EKG is not ordered today.    Recent Labs: 01/14/2023: ALT 20; BUN 23; Creatinine, Ser 1.44; Hemoglobin 15.1; Platelets 201; Potassium 4.1; Sodium 138  Recent Lipid Panel    Component Value Date/Time   CHOL 116 03/20/2022 1201   TRIG 152 (H) 03/20/2022 1201   HDL 43 03/20/2022 1201   CHOLHDL 2.7 03/20/2022 1201   CHOLHDL 3.4 06/05/2016 1048   VLDL 50 (H) 06/05/2016 1048   LDLCALC 47 03/20/2022 1201   LDLDIRECT 42.0 04/26/2015 0950     Risk Assessment/Calculations:    CHA2DS2-VASc Score = 6   This indicates a 9.7% annual risk of stroke. The patient's score is based upon: CHF History: 0 HTN History: 1 Diabetes History: 0 Stroke History: 2 Vascular Disease History: 1 Age Score: 2 Gender Score: 0              Physical Exam:    VS:  BP (!) 156/102  Comment: Right arm 140/100  Pulse 85   Ht '5\' 10"'$  (1.778 m)   Wt 187 lb 9.6 oz (85.1 kg)   SpO2 97%   BMI 26.92 kg/m     Wt Readings from Last 3 Encounters:  02/20/23 187 lb 9.6 oz (85.1 kg)  02/19/23 185 lb (83.9 kg)  01/17/23 190 lb (86.2 kg)     GEN:  Well  nourished, well developed in no acute distress HEENT: Normal NECK: No JVD; No carotid bruits LYMPHATICS: No lymphadenopathy CARDIAC: Irregularly irregular, no murmurs, rubs, gallops RESPIRATORY:  Clear to auscultation without rales, wheezing or rhonchi  ABDOMEN: Soft, non-tender, non-distended MUSCULOSKELETAL:  No edema; No deformity  SKIN: Warm and dry NEUROLOGIC:  Alert and oriented x 3 PSYCHIATRIC:  Normal affect   ASSESSMENT:    1. Essential hypertension, benign   2. Persistent atrial fibrillation ([place])   3. Coronary artery disease involving native coronary artery of native heart without angina pectoris   4. Mixed hyperlipidemia   5. S/P mitral valve repair    PLAN:    In order of problems listed above:  The patient's blood pressure remains well above goal.  We discussed nonpharmacologic management such as reduction in alcohol intake, low-sodium diet, and stress management.  I am going to increase his benazepril to 20 mg daily.  Will increase metoprolol to tartrate to 50 mg twice daily.  Will arrange Pharm.D. hypertension clinic follow-up in about 4 weeks.  I would like him to have updated labs at the time of that visit.  He has stage III chronic kidney disease with recent creatinine of 1.4 mg/dL.  This is stable from past blood work. Continue apixaban for anticoagulation.  MRI reviewed with remote lacunar infarct but no episode of an acute event at the time of his amnestic event last month. Stable without symptoms of angina.  No antiplatelet therapy in the context of chronic oral anticoagulation.  Continue beta-blocker and statin drug. Treated with rosuvastatin.  Last lipids with an LDL of 47 mg/dL. Normal function  of his mitral valve repair site noted on last year's echo with results outlined above.    Medication Adjustments/Labs and Tests Ordered: Current medicines are reviewed at length with the patient today.  Concerns regarding medicines are outlined above.  Orders Placed This Encounter  Procedures   Basic metabolic panel   AMB Referral to [facility] Pharm-D   Meds ordered this encounter  Medications   benazepril (LOTENSIN) 20 MG tablet    Sig: Take 1 tablet (20 mg total) by mouth daily.    Dispense:  90 tablet    Refill:  3   metoprolol tartrate (LOPRESSOR) 50 MG tablet    Sig: Take 1 tablet (50 mg total) by mouth 2 (two) times daily.    Dispense:  180 tablet    Refill:  3    Dose INCREASE    Patient Instructions  Medication Instructions:  INCREASE Benazepril to '20mg'$  daily INCREASE Metoprolol Tartrate to '50mg'$  twice daily *If you need a refill on your cardiac medications before your next appointment, please call your pharmacy*   Lab Work: BMET in 4 weeks If you have labs (blood work) drawn today and your tests are completely normal, you will receive your results only by: [place] (if you have MyChart) OR A paper copy in the mail If you have any lab test that is abnormal or we need to change your treatment, we will call you to review the results.   Testing/Procedures: Ambulatory Referral to PharmD clinic (HTN) in 4 weeks   Follow-Up: At [facility], you and your health needs are our priority.  As part of our continuing mission to provide you with exceptional heart care, we have created designated Provider Care Teams.  These Care Teams include your primary Cardiologist (physician) and Advanced Practice Providers (APPs -  Physician Assistants and Nurse Practitioners) who all work together to  provide you with the care you need, when you need it.  We recommend signing up for the patient portal called "MyChart".  Sign up information is provided on this After Visit  Summary.  MyChart is used to connect with patients for Virtual Visits (Telemedicine).  Patients are able to view lab/test results, encounter notes, upcoming appointments, etc.  Non-urgent messages can be sent to your provider as well.   To learn more about what you can do with MyChart, go to [email or web address].    Your next appointment:   6 month(s)  Provider:   Sherren Mocha, MD        Signed, Sherren Mocha, MD  02/20/2023 2:49 PM    [place]

## 2023-02-21 NOTE — Telephone Encounter (Signed)
Returned call to patient who states he checked his BP last night and it was 110/73. States he had not taken any medication at that time, no nightly meds, and felt completely fine. States his BP had gotten up to 137/85, so took metoprolol. States this morning before meds, it was 175/114. At time of call, his BP is 147/90. Advised him to continue checking BP and keeping a log and we'll see what the medication increases from yesterday do for his trends. He understands to call if concerning high or low BP arises or he develops any symptoms. Will follow with PharmD clinic for HTN.

## 2023-03-29 ENCOUNTER — Ambulatory Visit: Payer: Medicare Other | Attending: Cardiovascular Disease

## 2023-03-29 ENCOUNTER — Ambulatory Visit: Payer: Medicare Other

## 2023-03-29 DIAGNOSIS — I4819 Other persistent atrial fibrillation: Secondary | ICD-10-CM | POA: Diagnosis not present

## 2023-03-29 DIAGNOSIS — I2510 Atherosclerotic heart disease of native coronary artery without angina pectoris: Secondary | ICD-10-CM

## 2023-03-29 DIAGNOSIS — Z9889 Other specified postprocedural states: Secondary | ICD-10-CM | POA: Diagnosis not present

## 2023-03-29 DIAGNOSIS — E782 Mixed hyperlipidemia: Secondary | ICD-10-CM

## 2023-03-29 DIAGNOSIS — I10 Essential (primary) hypertension: Secondary | ICD-10-CM

## 2023-03-29 NOTE — Progress Notes (Unsigned)
Patient ID: Henry BruinsDennis G Volker                 DOB: 09/25/1946                      MRN: [id number]     HPI: Henry Page is a 77 y.o. male referred by Dr. Excell Seltzerooper to HTN clinic. PMH is significant for HTN, mitral valve disease s/p mitral valve repair, CAD s/p CABG (2014), AF s/p MAZE procedure, OSA, and CKD 3. Patient underwent evaluation [facility] ED [facility] in 12/2022 due to an episode of "transient global amnesia." At his last visit with Dr. Excell Seltzerooper on 02/20/2023, he was noted to be hypertensive (156/102 mmHg), and he stated that he had been experiencing a significant amount of anxiety since his time in the ED. Home BP readings had been elevated even prior to his ED visit (>140/90 mmHg) on benazepril and metoprolol. Given his elevated BP at home and in-clinic, his benazepril was increased from 10 to 20 mg daily and his metoprolol was increased from 25 mg BID to 50 mg BID.  Current HTN meds: benazepril 20 mg daily, metoprolol tartrate 50 mg BID Previously tried: hydrochlorothiazide (stopped in 2022 due to hypotension) BP goal: < 130/80  Family History: father - MI  Social History: never-smoker, occasional alcohol use (2 drinks per week), no illicit drug use.  Diet:   Exercise:   Home BP readings:   Wt Readings from Last 3 Encounters:  02/20/23 187 lb 9.6 oz (85.1 kg)  02/19/23 185 lb (83.9 kg)  01/17/23 190 lb (86.2 kg)   BP Readings from Last 3 Encounters:  02/20/23 (!) 156/102  02/19/23 (!) 178/111  01/17/23 (!) 172/107   Pulse Readings from Last 3 Encounters:  02/20/23 85  02/19/23 65  01/17/23 85    Renal function: CrCl cannot be calculated (Patient's most recent lab result is older than the maximum 21 days allowed.).  Past Medical History:  Diagnosis Date   Anxiety    Atrial fibrillation ([facility]) 07/06/2013   CKD (chronic kidney disease), stage III ([facility])    Essential hypertension, benign 07/06/2013   Kidney stones    some passed spontaneous, one retrieved by cystoscope     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   LIMA to LAD, Sequential SVG to intermediate and OM1, SVG to RCA, EVH via right thigh   S/P Maze operation for atrial fibrillation 07/22/2013   Complete bilateral atrial lesion set using bipolar radiofrequency and cryothermy ablation with clipping of LA appendage    S/P mitral valve repair 07/22/2013   Complex valvuloplasty including triangular resection of posterior leaflet with artificial Gore-tex neocord placement x2 and 34mm Sorin Memo 3D ring annuloplasty   Sleep apnea     Current Outpatient Medications on File Prior to Visit  Medication Sig Dispense Refill   amoxicillin (AMOXIL) 500 MG capsule Take 1,000 mg by mouth as needed (for pt's allergies).     apixaban (ELIQUIS) 5 MG TABS tablet Take by mouth.     benazepril (LOTENSIN) 20 MG tablet Take 1 tablet (20 mg total) by mouth daily. 90 tablet 3   cetirizine (ZYRTEC) 10 MG tablet Take 10 mg by mouth daily as needed for allergies.     Coenzyme Q10 (CO Q 10) 100 MG CAPS Take 1 capsule by mouth daily.     ELIQUIS 5 MG TABS tablet TAKE 1 TABLET BY MOUTH TWICE DAILY 180 tablet 2   fenofibrate (TRICOR)  48 MG tablet TAKE 1 TABLET BY MOUTH DAILY 90 tablet 0   magnesium oxide (MAG-OX) 400 (240 Mg) MG tablet Take 400 mg by mouth daily.     metoprolol tartrate (LOPRESSOR) 50 MG tablet Take 1 tablet (50 mg total) by mouth 2 (two) times daily. 180 tablet 3   Multiple Vitamin (MULTIVITAMIN) tablet Take 1 tablet by mouth daily.     rosuvastatin (CRESTOR) 10 MG tablet TAKE 1 TABLET BY MOUTH DAILY 90 tablet 1   sildenafil (REVATIO) 20 MG tablet Take 1-5 tablets by mouth daily as needed 90 tablet 5   No current facility-administered medications on file prior to visit.    Allergies  Allergen Reactions   Shellfish Allergy Other (See Comments)    flulike symptoms    Sulfa Antibiotics Other (See Comments)    "very sick"   Meperidine And Related Itching   Misc. Sulfonamide Containing Compounds      Assessment/Plan:  1.  Hypertension -

## 2023-03-30 LAB — BASIC METABOLIC PANEL
BUN/Creatinine Ratio: 15 (ref 10–24)
BUN: 21 mg/dL (ref 8–27)
CO2: 24 mmol/L (ref 20–29)
Calcium: 9.6 mg/dL (ref 8.6–10.2)
Chloride: 103 mmol/L (ref 96–106)
Creatinine, Ser: 1.4 mg/dL — ABNORMAL HIGH (ref 0.76–1.27)
Glucose: 98 mg/dL (ref 70–99)
Potassium: 4.4 mmol/L (ref 3.5–5.2)
Sodium: 143 mmol/L (ref 134–144)
eGFR: 52 mL/min/{1.73_m2} — ABNORMAL LOW (ref >59)

## 2023-04-08 ENCOUNTER — Telehealth: Payer: Self-pay | Admitting: Cardiovascular Disease

## 2023-04-08 NOTE — Telephone Encounter (Signed)
  Pt c/o BP issue: STAT if pt c/o blurred vision, one-sided weakness or slurred speech  1. What are your last 5 BP readings?  5 days reading   137/80 - before meds 136/74 - after meds  134/89 - before meds 137/80 - after meds  139/83 - before meds 131/77 - after meds  154/95 - before meds 116/71 - after meds  149/84 - before meds 130/80 - after meds  2. Are you having any other symptoms (ex. Dizziness, headache, blurred vision, passed out)?   3. What is your BP issue? Pt gave his BP reading for 5 days, pt said to call him back to [phone]

## 2023-04-09 NOTE — Telephone Encounter (Signed)
Returned call to patient who has no concerns, just reporting BP post med changes at last visit. States he is happy with where he's at and will call us if needed.

## 2023-04-16 ENCOUNTER — Ambulatory Visit: Payer: Medicare Other

## 2023-04-22 ENCOUNTER — Ambulatory Visit: Payer: Medicare Other | Attending: Cardiovascular Disease | Admitting: Pharmacist

## 2023-04-22 VITALS — BP 140/84 | HR 77

## 2023-04-22 DIAGNOSIS — I10 Essential (primary) hypertension: Secondary | ICD-10-CM

## 2023-04-22 MED ORDER — CARVEDILOL 12.5 MG PO TABS: 12.5000 mg | ORAL_TABLET | Freq: Two times a day (BID) | ORAL | 3 refills | Status: DC

## 2023-04-22 NOTE — Assessment & Plan Note (Signed)
Assessment: Blood pressure significantly elevated on first reading in clinic.  Improved to 140/84 but still above goal of less than 130/80 Patient would prefer not to add more medications We did discuss the possibility of adding 2.5 mg of amlodipine versus increasing benazepril to 40 mg daily versus changing metoprolol to tartrate to carvedilol Patient admits that he does not do much physical activity.  States being retired makes it easy to be lazy.  Plan: Stop metoprolol and start carvedilol 12.5 mg twice a day Continue benazepril 20 mg daily Strongly encourage patient to start an exercise routine aiming to go for a walk daily Follow-up in clinic in 1 month

## 2023-04-22 NOTE — Progress Notes (Signed)
Patient ID: Henry Page                 DOB: 1946-12-10                      MRN: [id number]      HPI: Henry Page is a 77 y.o. male referred by Dr. Excell Page to HTN clinic. PMH is significant for mitral valve disease s/p mitral valve repair, CAD s/p CABG, afib s/p maze procedure and a recent episode of 'transient global amnesia. Saw Dr. Excell Page 2/28. BP was 156/102. Benazepril was increased to 20mg  daily and metoprolol was increased to 50mg  BID. Repeat BMP was stable.   Patient presents today to hypertension clinic.  He reports his blood pressure is improved.  Mostly in the 130s to 140s systolic.  Reports sometimes he checks it about 8 times within 20 minutes and I will range greatly.  For example today the highest was 160/93 and the lowest 132/76.  Occasionally will get a blood pressure in the 120s.  Admits that he does not do a whole lot of exercise.  Did recently cut his grass with a push mower this is riding mower was in the shop.  Heart rate generally around 64.  Reports slight increase in fatigue since medications were adjusted.  States his blood pressure has always been about the 140s.  Current HTN meds: benazepril 20mg  daily, metoprolol tartrate 50mg  twice a day Previously tried:  BP goal: <130/80  Family History:  Family History  Problem Relation Age of Onset   Heart attack Father     Social History:  Social History   Socioeconomic History   Marital status: Married    Spouse name: Not on file   Number of children: Not on file   Years of education: Not on file   Highest education level: Not on file  Occupational History   Occupation: sales  Tobacco Use   Smoking status: Never   Smokeless tobacco: Never  Vaping Use   Vaping Use: Never used  Substance and Sexual Activity   Alcohol use: Yes    Alcohol/week: 2.0 standard drinks of alcohol    Types: 2 Standard drinks or equivalent per week   Drug use: No   Sexual activity: Yes  Other Topics Concern   Not on file  Social  History Narrative   Drinks caffeine   Retired   Lives with wife   Social Determinants of Health   Financial Resource Strain: Not on file  Food Insecurity: Not on file  Transportation Needs: Not on file  Physical Activity: Not on file  Stress: Not on file  Social Connections: Not on file  Intimate Partner Violence: Not on file    Diet: Not discussed this visit  Exercise:  none  Home BP readings:  137/80 - before meds 136/74 - after meds   134/89 - before meds 137/80 - after meds   139/83 - before meds 131/77 - after meds   154/95 - before meds 116/71 - after meds   149/84 - before meds 130/80 - after meds  Wt Readings from Last 3 Encounters:  02/20/23 187 lb 9.6 oz (85.1 kg)  02/19/23 185 lb (83.9 kg)  01/17/23 190 lb (86.2 kg)   BP Readings from Last 3 Encounters:  04/22/23 (!) 140/84  02/20/23 (!) 156/102  02/19/23 (!) 178/111   Pulse Readings from Last 3 Encounters:  04/22/23 77  02/20/23 85  02/19/23 65  Renal function: CrCl cannot be calculated (Patient's most recent lab result is older than the maximum 21 days allowed.).  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   LIMA to LAD, Sequential SVG to intermediate and OM1, SVG to RCA, EVH via right thigh   S/P Maze operation for atrial fibrillation 07/22/2013   Complete bilateral atrial lesion set using bipolar radiofrequency and cryothermy ablation with clipping of LA appendage    S/P mitral valve repair 07/22/2013   Complex valvuloplasty including triangular resection of posterior leaflet with artificial Gore-tex neocord placement x2 and 34mm Sorin Memo 3D ring annuloplasty   Sleep apnea     Current Outpatient Medications on File Prior to Visit  Medication Sig  Dispense Refill   amoxicillin (AMOXIL) 500 MG capsule Take 1,000 mg by mouth as needed (for pt's allergies).     apixaban (ELIQUIS) 5 MG TABS tablet Take by mouth.     benazepril (LOTENSIN) 20 MG tablet Take 1 tablet (20 mg total) by mouth daily. 90 tablet 3   cetirizine (ZYRTEC) 10 MG tablet Take 10 mg by mouth daily as needed for allergies.     Coenzyme Q10 (CO Q 10) 100 MG CAPS Take 1 capsule by mouth daily.     ELIQUIS 5 MG TABS tablet TAKE 1 TABLET BY MOUTH TWICE DAILY 180 tablet 2   fenofibrate (TRICOR) 48 MG tablet TAKE 1 TABLET BY MOUTH DAILY 90 tablet 0   magnesium oxide (MAG-OX) 400 (240 Mg) MG tablet Take 400 mg by mouth daily.     Multiple Vitamin (MULTIVITAMIN) tablet Take 1 tablet by mouth daily.     rosuvastatin (CRESTOR) 10 MG tablet TAKE 1 TABLET BY MOUTH DAILY 90 tablet 1   sildenafil (REVATIO) 20 MG tablet Take 1-5 tablets by mouth daily as needed 90 tablet 5   No current facility-administered medications on file prior to visit.    Allergies  Allergen Reactions   Shellfish Allergy Other (See Comments)    flulike symptoms    Sulfa Antibiotics Other (See Comments)    "very sick"   Meperidine And Related Itching   Misc. Sulfonamide Containing Compounds     Blood pressure (!) 140/84, pulse 77, SpO2 97 %.   Assessment/Plan:  1. Hypertension -  Essential hypertension, benign Assessment: Blood pressure significantly elevated on first reading in clinic.  Improved to 140/84 but still above goal of less than 130/80 Patient would prefer not to add more medications We did discuss the possibility of adding 2.5 mg of amlodipine versus increasing benazepril to 40 mg daily versus changing metoprolol to tartrate to carvedilol Patient admits that he does not do much physical activity.  States being retired makes it easy to be lazy.  Plan: Stop metoprolol and start carvedilol 12.5 mg twice a day Continue benazepril 20 mg daily Strongly encourage patient to start an exercise  routine aiming to go for a walk daily Follow-up in clinic in 1 month   Thank you  Olene Floss, Pharm.D, BCPS, CPP [facility] of [facility] [address], [place], [place] [address]  Phone: [phone]; Fax: [phone]

## 2023-04-22 NOTE — Patient Instructions (Signed)
Summary of today's discussion  STOP metoprolol  2. START carvedilol 12.5mg  twice a day  3. Continue benazapril 20mg  daily  4.Continue checking blood pressure at home  5. Start exercising   Your blood pressure goal is <130/80  To check your pressure at home you will need to:  1. Sit up in a chair, with feet flat on the floor and back supported. Do not cross your ankles or legs. 2. Rest your left arm so that the cuff is about heart level. If the cuff goes on your upper arm,  then just relax the arm on the table, arm of the chair or your lap. If you have a wrist cuff, we  suggest relaxing your wrist against your chest (think of it as Pledging the Flag with the  wrong arm).  3. Place the cuff snugly around your arm, about 1 inch above the crook of your elbow. The  cords should be inside the groove of your elbow.  4. Sit quietly, with the cuff in place, for about 5 minutes. After that 5 minutes press the power  button to start a reading. 5. Do not talk or move while the reading is taking place.  6. Record your readings on a sheet of paper. Although most cuffs have a memory, it is often  easier to see a pattern developing when the numbers are all in front of you.  7. You can repeat the reading after 1-3 minutes if it is recommended  Make sure your bladder is empty and you have not had caffeine or tobacco within the last 30 min  Always bring your blood pressure log with you to your appointments. If you have not brought your monitor in to be double checked for accuracy, please bring it to your next appointment.  You can find a list of validated (accurate) blood pressure cuffs at WirelessNovelties.no   Important lifestyle changes to control high blood pressure  Intervention  Effect on the BP  Lose extra pounds and watch your waistline Weight loss is one of the most effective lifestyle changes for controlling blood pressure. If you're overweight or obese, losing even a small amount of weight can  help reduce blood pressure. Blood pressure might go down by about 1 millimeter of mercury (mm Hg) with each kilogram (about 2.2 pounds) of weight lost.  Exercise regularly As a general goal, aim for at least 30 minutes of moderate physical activity every day. Regular physical activity can lower high blood pressure by about 5 to 8 mm Hg.  Eat a healthy diet Eating a diet rich in whole grains, fruits, vegetables, and low-fat dairy products and low in saturated fat and cholesterol. A healthy diet can lower high blood pressure by up to 11 mm Hg.  Reduce salt (sodium) in your diet Even a small reduction of sodium in the diet can improve heart health and reduce high blood pressure by about 5 to 6 mm Hg.  Limit alcohol One drink equals 12 ounces of beer, 5 ounces of wine, or 1.5 ounces of 80-proof liquor.  Limiting alcohol to less than one drink a day for women or two drinks a day for men can help lower blood pressure by about 4 mm Hg.   Please call me at [phone] with any questions.

## 2023-04-23 DIAGNOSIS — D485 Neoplasm of uncertain behavior of skin: Secondary | ICD-10-CM | POA: Diagnosis not present

## 2023-04-23 DIAGNOSIS — L570 Actinic keratosis: Secondary | ICD-10-CM | POA: Diagnosis not present

## 2023-04-23 DIAGNOSIS — Z85828 Personal history of other malignant neoplasm of skin: Secondary | ICD-10-CM | POA: Diagnosis not present

## 2023-04-23 DIAGNOSIS — D0361 Melanoma in situ of right upper limb, including shoulder: Secondary | ICD-10-CM | POA: Diagnosis not present

## 2023-04-26 ENCOUNTER — Other Ambulatory Visit: Payer: Self-pay | Admitting: Cardiovascular Disease

## 2023-05-02 DIAGNOSIS — C4361 Malignant melanoma of right upper limb, including shoulder: Secondary | ICD-10-CM | POA: Diagnosis not present

## 2023-05-07 ENCOUNTER — Telehealth: Payer: Self-pay | Admitting: Pharmacist

## 2023-05-07 MED ORDER — CARVEDILOL 25 MG PO TABS: 25.0000 mg | ORAL_TABLET | Freq: Two times a day (BID) | ORAL | 3 refills | Status: DC

## 2023-05-07 NOTE — Telephone Encounter (Signed)
Patient called and LVM that since switching from metoprolol to carvedilol that his BP has been up and is HR fluctuates.  Called pt and LVM for him to call back Depending on his HR, can increase carvedilol to 25mg  BID.

## 2023-05-07 NOTE — Telephone Encounter (Signed)
Spoke with patient. BP this AM 158/93. States his BP is much better in the afternoons. HR 80's. Will increase carvedilol to 25mg  BID. Pt already has f.u with me 5/24. He is aware he can double his 12.5mg  tablets.

## 2023-05-09 DIAGNOSIS — H25813 Combined forms of age-related cataract, bilateral: Secondary | ICD-10-CM | POA: Diagnosis not present

## 2023-05-10 NOTE — Telephone Encounter (Signed)
Patient called 5/16 and LVM on my machine. Called pt back 5/17. Went right to VM. Left message.

## 2023-05-13 NOTE — Telephone Encounter (Signed)
Patient called reports his morning BP readings varies a lot since he started taking carvedilol 25 mg twice daily 140/85 135/89 118/82 119/85 and PM readings 99/73 104/68. He thinks his HR were better controlled with metoprolol as they were staying in low 70 range now they are in 70-79. He gets dizzy spells while standing.He wants Melissa to call him back regarding this issue.

## 2023-05-13 NOTE — Telephone Encounter (Signed)
I spoke with patient. Advised that it is ok to take the carvedilol when BP is the in lower 100's. Advised that this medication does not drop BP suddenly and taking regularly prevents spikes in blood pressure. I advised that he not skip any doses unless BP is <90 systolic. Ok to take 12.5mg  if BP is in the 90's systolic. He should call me if that happens consistently. Patient agreeable to plan. He will follow up with me in the office soon.

## 2023-05-17 ENCOUNTER — Ambulatory Visit: Payer: Medicare Other | Attending: Cardiovascular Disease | Admitting: Pharmacist

## 2023-05-17 VITALS — BP 140/100 | HR 73

## 2023-05-17 DIAGNOSIS — I10 Essential (primary) hypertension: Secondary | ICD-10-CM | POA: Diagnosis not present

## 2023-05-17 NOTE — Progress Notes (Signed)
Patient ID: [name]                 DOB: July 16, 1946                      MRN: [id number]      HPI: Henry Page is a 77 y.o. male referred by Dr. Excell Page to HTN clinic. PMH is significant for mitral valve disease s/p mitral valve repair, CAD s/p CABG, afib s/p maze procedure and a recent episode of 'transient global amnesia. Saw Dr. Excell Page 2/28. BP was 156/102. Benazepril was increased to 20mg  daily and metoprolol was increased to 50mg  BID. Repeat BMP was stable.   At last visit on 04/22/23 metprolol was changed to carvedilol 12.5mg  twice a day. Patient called clinic 5/14 stating that his BP was up and his HR was higher than normal. Carvedilol was increased to 25mg  BID. Patient called clinic again 5/20 reporting that he was getting some lower BP in the evening and asking if he should skip his evening carvedilol. Reported to me reading of 99/73 and 104/68. He was instructed to take carvedilol 25mg  unless his SBP was <100, then he could take 12.5mg .   Patient presents today to clinic accompanied by his wife.  His blood pressure is quite variable.  Can be pretty low in the evenings, but not uncommon to be in the mid 130s over 80s during the day.  Patient is still working.  His wife wishes he would retire as she states his job is stressful however patient states that he enjoys working.  He does not do a whole lot of extra physical activity, when he does he is tired.  He reports episodes of his head feeling pressure that lasts a few seconds.  This used to happen maybe once a day and now has been occurring 6-8 times per day.  He is unsure if this is due to his sinuses or blood pressure.  Denies the sensation he is going to pass out and denies any vision changes.  Patient is very adamant that he does not want any more medications.  Current HTN meds: benazepril 20mg  daily, carvedilol 25mg  twice a day Previously tried:  BP goal: <130/80  Family History:  Family History  Problem Relation Age of Onset    Heart attack Father     Social History:  Social History   Socioeconomic History   Marital status: Married    Spouse name: Not on file   Number of children: Not on file   Years of education: Not on file   Highest education level: Not on file  Occupational History   Occupation: sales  Tobacco Use   Smoking status: Never   Smokeless tobacco: Never  Vaping Use   Vaping Use: Never used  Substance and Sexual Activity   Alcohol use: Yes    Alcohol/week: 2.0 standard drinks of alcohol    Types: 2 Standard drinks or equivalent per week   Drug use: No   Sexual activity: Yes  Other Topics Concern   Not on file  Social History Narrative   Drinks caffeine   Retired   Lives with wife   Social Determinants of Corporate investment banker Strain: Not on file  Food Insecurity: Not on file  Transportation Needs: Not on file  Physical Activity: Not on file  Stress: Not on file  Social Connections: Not on file  Intimate Partner Violence: Not on file    Diet: Not discussed  this visit  Exercise:  none  Home BP readings:  AM 120/80, 135/80, 147/90, 140/85, 125/74, 135/89, 118/82, 119/85, 109/73, 138/82, 132/82 PM 116/79, 127/74, 144/83, 124/74, 124/71, 138/86, 136/81, 136/89, 110/72, 139/85, 99/73, 108/79, 104/68, 117/85, 98/62, 124/84, 139/73   Wt Readings from Last 3 Encounters:  02/20/23 187 lb 9.6 oz (85.1 kg)  02/19/23 185 lb (83.9 kg)  01/17/23 190 lb (86.2 kg)   BP Readings from Last 3 Encounters:  05/17/23 (!) 140/100  04/22/23 (!) 140/84  02/20/23 (!) 156/102   Pulse Readings from Last 3 Encounters:  05/17/23 73  04/22/23 77  02/20/23 85    Renal function: CrCl cannot be calculated (Patient's most recent lab result is older than the maximum 21 days allowed.).  Past Medical History:  Diagnosis Date   Anxiety    Atrial fibrillation (HCC) 07/06/2013   CKD (chronic kidney disease), stage III (HCC)    Essential hypertension, benign 07/06/2013   Kidney stones     some passed spontaneous, one retrieved by cystoscope    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   LIMA to LAD, Sequential SVG to intermediate and OM1, SVG to RCA, EVH via right thigh   S/P Maze operation for atrial fibrillation 07/22/2013   Complete bilateral atrial lesion set using bipolar radiofrequency and cryothermy ablation with clipping of LA appendage    S/P mitral valve repair 07/22/2013   Complex valvuloplasty including triangular resection of posterior leaflet with artificial Gore-tex neocord placement x2 and 34mm Sorin Memo 3D ring annuloplasty   Sleep apnea     Current Outpatient Medications on File Prior to Visit  Medication Sig Dispense Refill   benazepril (LOTENSIN) 20 MG tablet Take 1 tablet (20 mg total) by mouth daily. 90 tablet 3   carvedilol (COREG) 25 MG tablet Take 1 tablet (25 mg total) by mouth 2 (two) times daily. 180 tablet 3   cetirizine (ZYRTEC) 10 MG tablet Take 10 mg by mouth daily as needed for allergies.     Coenzyme Q10 (CO Q 10) 100 MG CAPS Take 1 capsule by mouth daily.     ELIQUIS 5 MG TABS tablet TAKE 1 TABLET BY MOUTH TWICE DAILY 180 tablet 2   magnesium oxide (MAG-OX) 400 (240 Mg) MG tablet Take 400 mg by mouth daily.     Multiple Vitamin (MULTIVITAMIN) tablet Take 1 tablet by mouth daily.     rosuvastatin (CRESTOR) 10 MG tablet TAKE 1 TABLET BY MOUTH DAILY 90 tablet 1   amoxicillin (AMOXIL) 500 MG capsule Take 1,000 mg by mouth as needed (for pt's allergies).     fenofibrate (TRICOR) 48 MG tablet TAKE 1 TABLET BY MOUTH DAILY 90 tablet 3   sildenafil (REVATIO) 20 MG tablet Take 1-5 tablets by mouth daily as needed 90 tablet 5   No current facility-administered medications on file prior to visit.    Allergies  Allergen Reactions   Shellfish Allergy Other (See Comments)    flulike symptoms    Sulfa Antibiotics Other (See Comments)    "very sick"   Meperidine And Related Itching   Misc. Sulfonamide Containing Compounds     Blood pressure (!) 140/100, pulse  73.   Assessment/Plan:  1. Hypertension -  Essential hypertension, benign Assessment: Blood pressure is significantly elevated in clinic, home readings are better but still not consistently less than 130/80 Blood pressure is pretty erratic We discussed trying to find a happy medium or a medication that could help stabilize blood pressure.  I propose that we  decrease either his carvedilol or his benazepril and add a small dose of amlodipine.  He has compelling indication for both an ACE and beta-blocker therefore would not want to stop either 1 of those.  Patient is very much against adding an additional medication We discussed cutting back on alcohol, not drinking every day We discussed increasing physical activity 150 minutes of moderate aerobic activity could lower blood pressure about 6-8 points systolic  Plan: Patient refuses any additional medications but is in agreement to start walking 10 to 15 minutes/day I will see the patient in follow-up in about a month and a half    Thank you  Olene Floss, Pharm.D, BCPS, CPP [facility] Division of [facility] [address], [place], [place] [address]  Phone: [phone]; Fax: [phone]

## 2023-05-17 NOTE — Patient Instructions (Signed)
Summary of today's discussion  1.Start walking daily  2.Continue benazepril 20mg  daily, carvedilol 25mg  twice a day  3.  4.  5.   Your blood pressure goal is <130/80  To check your pressure at home you will need to:  1. Sit up in a chair, with feet flat on the floor and back supported. Do not cross your ankles or legs. 2. Rest your left arm so that the cuff is about heart level. If the cuff goes on your upper arm,  then just relax the arm on the table, arm of the chair or your lap. If you have a wrist cuff, we  suggest relaxing your wrist against your chest (think of it as Pledging the Flag with the  wrong arm).  3. Place the cuff snugly around your arm, about 1 inch above the crook of your elbow. The  cords should be inside the groove of your elbow.  4. Sit quietly, with the cuff in place, for about 5 minutes. After that 5 minutes press the power  button to start a reading. 5. Do not talk or move while the reading is taking place.  6. Record your readings on a sheet of paper. Although most cuffs have a memory, it is often  easier to see a pattern developing when the numbers are all in front of you.  7. You can repeat the reading after 1-3 minutes if it is recommended  Make sure your bladder is empty and you have not had caffeine or tobacco within the last 30 min  Always bring your blood pressure log with you to your appointments. If you have not brought your monitor in to be double checked for accuracy, please bring it to your next appointment.  You can find a list of validated (accurate) blood pressure cuffs at [employer]Novelties.no   Important lifestyle changes to control high blood pressure  Intervention  Effect on the BP  Lose extra pounds and watch your waistline Weight loss is one of the most effective lifestyle changes for controlling blood pressure. If you're overweight or obese, losing even a small amount of weight can help reduce blood pressure. Blood pressure might go down  by about 1 millimeter of mercury (mm Hg) with each kilogram (about 2.2 pounds) of weight lost.  Exercise regularly As a general goal, aim for at least 30 minutes of moderate physical activity every day. Regular physical activity can lower high blood pressure by about 5 to 8 mm Hg.  Eat a healthy diet Eating a diet rich in whole grains, fruits, vegetables, and low-fat dairy products and low in saturated fat and cholesterol. A healthy diet can lower high blood pressure by up to 11 mm Hg.  Reduce salt (sodium) in your diet Even a small reduction of sodium in the diet can improve heart health and reduce high blood pressure by about 5 to 6 mm Hg.  Limit alcohol One drink equals 12 ounces of beer, 5 ounces of wine, or 1.5 ounces of 80-proof liquor.  Limiting alcohol to less than one drink a day for women or two drinks a day for men can help lower blood pressure by about 4 mm Hg.   Please call me at [phone] with any questions.

## 2023-05-17 NOTE — Assessment & Plan Note (Signed)
Assessment: Blood pressure is significantly elevated in clinic, home readings are better but still not consistently less than 130/80 Blood pressure is pretty erratic We discussed trying to find a happy medium or a medication that could help stabilize blood pressure.  I propose that we decrease either his carvedilol or his benazepril and add a small dose of amlodipine.  He has compelling indication for both an ACE and beta-blocker therefore would not want to stop either 1 of those.  Patient is very much against adding an additional medication We discussed cutting back on alcohol, not drinking every day We discussed increasing physical activity 150 minutes of moderate aerobic activity could lower blood pressure about 6-8 points systolic  Plan: Patient refuses any additional medications but is in agreement to start walking 10 to 15 minutes/day I will see the patient in follow-up in about a month and a half

## 2023-05-18 ENCOUNTER — Telehealth: Payer: Self-pay | Admitting: Physician Assistant

## 2023-05-18 NOTE — Telephone Encounter (Signed)
Returned call to patient for labile BP.   BP 116/83 this morning then took 25 mg coreg and 20 mg benazepril with 80 mg Viagra He now feels dizzy and lightheaded. BP now 90/52 with HR 72.   I advised hydration with gatorade diluted with water, to not drive, and if symptoms do not resolve, consider ER evaluation. I advised that he may not be able to take the coreg tonight, or may only take 12.5 mg. He expressed understanding of the plan.   Marcelino Duster, PA-C 05/18/2023, 4:55 PM [phone] [facility] [address] [place], [place] [address]

## 2023-05-21 NOTE — Telephone Encounter (Signed)
Spoke with patient: Friday night was out in the heat Sat AM 116/83 - took medications Afternoon BP was 88/60- felt very dizzy Took no meds that AM Sunday 126/72 took 12.5mg  of carvedilol and benazepril Afteroon BP 139/86 138/88 Monday AM  115/76 in afternoon  12.5mg  carvedilol  This AM 139/86  Advised pt to decrease carvedilol to 12.5mg  BID. Stay hydrated.

## 2023-05-21 NOTE — Addendum Note (Signed)
Addended by: Malena Peer D on: 05/21/2023 12:23 PM   Modules accepted: Orders

## 2023-06-14 DIAGNOSIS — C4361 Malignant melanoma of right upper limb, including shoulder: Secondary | ICD-10-CM | POA: Diagnosis not present

## 2023-06-18 DIAGNOSIS — D0361 Melanoma in situ of right upper limb, including shoulder: Secondary | ICD-10-CM | POA: Diagnosis not present

## 2023-06-18 DIAGNOSIS — C4361 Malignant melanoma of right upper limb, including shoulder: Secondary | ICD-10-CM | POA: Diagnosis not present

## 2023-06-25 DIAGNOSIS — Z8673 Personal history of transient ischemic attack (TIA), and cerebral infarction without residual deficits: Secondary | ICD-10-CM | POA: Diagnosis not present

## 2023-06-25 DIAGNOSIS — I4891 Unspecified atrial fibrillation: Secondary | ICD-10-CM | POA: Diagnosis not present

## 2023-06-25 DIAGNOSIS — G4733 Obstructive sleep apnea (adult) (pediatric): Secondary | ICD-10-CM | POA: Diagnosis not present

## 2023-06-25 DIAGNOSIS — E785 Hyperlipidemia, unspecified: Secondary | ICD-10-CM | POA: Diagnosis not present

## 2023-06-25 DIAGNOSIS — Z7901 Long term (current) use of anticoagulants: Secondary | ICD-10-CM | POA: Diagnosis not present

## 2023-06-25 DIAGNOSIS — I2510 Atherosclerotic heart disease of native coronary artery without angina pectoris: Secondary | ICD-10-CM | POA: Diagnosis not present

## 2023-06-25 DIAGNOSIS — N1831 Chronic kidney disease, stage 3a: Secondary | ICD-10-CM | POA: Diagnosis not present

## 2023-06-26 DIAGNOSIS — I4891 Unspecified atrial fibrillation: Secondary | ICD-10-CM | POA: Diagnosis not present

## 2023-06-26 DIAGNOSIS — L905 Scar conditions and fibrosis of skin: Secondary | ICD-10-CM | POA: Diagnosis not present

## 2023-06-26 DIAGNOSIS — I2510 Atherosclerotic heart disease of native coronary artery without angina pectoris: Secondary | ICD-10-CM | POA: Diagnosis not present

## 2023-06-26 DIAGNOSIS — Z951 Presence of aortocoronary bypass graft: Secondary | ICD-10-CM | POA: Diagnosis not present

## 2023-06-26 DIAGNOSIS — E785 Hyperlipidemia, unspecified: Secondary | ICD-10-CM | POA: Diagnosis not present

## 2023-06-26 DIAGNOSIS — C4361 Malignant melanoma of right upper limb, including shoulder: Secondary | ICD-10-CM | POA: Diagnosis not present

## 2023-06-26 DIAGNOSIS — Z8673 Personal history of transient ischemic attack (TIA), and cerebral infarction without residual deficits: Secondary | ICD-10-CM | POA: Diagnosis not present

## 2023-06-26 DIAGNOSIS — Z79899 Other long term (current) drug therapy: Secondary | ICD-10-CM | POA: Diagnosis not present

## 2023-06-26 DIAGNOSIS — Z7901 Long term (current) use of anticoagulants: Secondary | ICD-10-CM | POA: Diagnosis not present

## 2023-06-26 DIAGNOSIS — I10 Essential (primary) hypertension: Secondary | ICD-10-CM | POA: Diagnosis not present

## 2023-07-05 ENCOUNTER — Encounter: Payer: Self-pay | Admitting: Pharmacist

## 2023-07-05 ENCOUNTER — Ambulatory Visit: Payer: Medicare Other | Attending: Internal Medicine | Admitting: Pharmacist

## 2023-07-05 VITALS — BP 144/82 | HR 76

## 2023-07-05 DIAGNOSIS — I4819 Other persistent atrial fibrillation: Secondary | ICD-10-CM | POA: Diagnosis not present

## 2023-07-05 DIAGNOSIS — I2510 Atherosclerotic heart disease of native coronary artery without angina pectoris: Secondary | ICD-10-CM

## 2023-07-05 DIAGNOSIS — E782 Mixed hyperlipidemia: Secondary | ICD-10-CM

## 2023-07-05 DIAGNOSIS — Z9889 Other specified postprocedural states: Secondary | ICD-10-CM

## 2023-07-05 DIAGNOSIS — I10 Essential (primary) hypertension: Secondary | ICD-10-CM

## 2023-07-05 MED ORDER — CARVEDILOL 25 MG PO TABS: 25.0000 mg | ORAL_TABLET | Freq: Two times a day (BID) | ORAL | 3 refills | Status: DC

## 2023-07-05 MED ORDER — BENAZEPRIL HCL 20 MG PO TABS: 20.0000 mg | ORAL_TABLET | Freq: Every day | ORAL | 3 refills | Status: DC

## 2023-07-05 NOTE — Patient Instructions (Signed)
Your blood pressure goal is < 130/32mmHg  Continue taking your current medications  You can take 12.5mg  of carvedilol if your systolic blood pressure is < 110  You can skip your carvedilol if your systolic blood pressure is < 100

## 2023-07-05 NOTE — Progress Notes (Signed)
Patient ID: Henry Page                 DOB: [birth year]                      MRN: [id number]     HPI: Henry Page is a 77 y.o. male referred by Dr. Excell Seltzer to HTN clinic. PMH is significant for afib, CAD s/p CABG, HTN, HLD, CKD, and mitral valve repair. Pt saw Dr Excell Seltzer 02/20/23 where BP was elevated at 156/102; his benazepril was increased to 20mg  daily and metoprolol was increased to 50mg  BID. Saw PharmD on 04/22/23, BP was 140/84 and metoprolol was changed to carvedilol. Called in with fluctuating BP and dizziness, was advised to skip carvedilol if systolic BP <90. Most recently saw PharmD on 05/17/23 where BP was elevated at 140/100. Pt did not want to add any medication and was advised to decrease alcohol and increase physical activity. Pt called clinic the next day. Felt dizzy and lightheaded, BP 90/52 after taking BP meds and Viagra, had also been out in the heat. Advised to stay hydrated and decrease carvedilol to 12.5mg  BID.  Pt presents today for follow up. 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 BP was high there 150-170 range. Recovering well, hasn't been able to be as active with excision site healing. Stopped drinking a few scotches in the evening. Doesn't use NSAIDs or drink caffeine. Reports some white coat HTN and that home cuff has been found to be accurate. Has been eating less/better and lost 4-5 lbs.  Has increased his carvedilol back to 25mg  BID for the most part. Takes 12.5mg  dose if SBP is < 110, and skips if SBP is < 100. No further episodes of dizziness, feels like it was a one time occurrence - combination of heat, dehydration, had taken a dose of Viagra. Staying more hydrated since then. Brings in a log of detailed BP readings noted below. Checks BP twice daily before taking his meds as this allows him to determine what dose of carvedilol to take. Has used lower 12.5mg  dose maybe 3x in the past week. 4 readings above goal, 2 low, rest at goal. Took his meds 90 mins prior to  visit today.  Current HTN meds: benazepril 20mg  daily (AM), carvedilol 25mg  BID  BP goal: <130/65mmHg  Family History: Father with MI.  Social History: No tobacco or drug use, occasional alcohol use.  Home BP readings:     Wt Readings from Last 3 Encounters:  02/20/23 187 lb 9.6 oz (85.1 kg)  02/19/23 185 lb (83.9 kg)  01/17/23 190 lb (86.2 kg)   BP Readings from Last 3 Encounters:  05/17/23 (!) 140/100  04/22/23 (!) 140/84  02/20/23 (!) 156/102   Pulse Readings from Last 3 Encounters:  05/17/23 73  04/22/23 77  02/20/23 85    Renal function: CrCl cannot be calculated (Patient's most recent lab result is older than the maximum 21 days allowed.).  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   LIMA to LAD, Sequential SVG to intermediate and OM1, SVG to RCA, EVH via right thigh   S/P Maze operation for atrial fibrillation 07/22/2013   Complete bilateral atrial  lesion set using bipolar radiofrequency and cryothermy ablation with clipping of LA appendage    S/P mitral valve repair 07/22/2013   Complex valvuloplasty including triangular resection of posterior leaflet with artificial Gore-tex neocord placement x2 and 34mm Sorin Memo 3D ring annuloplasty   Sleep apnea     Current Outpatient Medications on File Prior to Visit  Medication Sig Dispense Refill   amoxicillin (AMOXIL) 500 MG capsule Take 1,000 mg by mouth as needed (for pt's allergies).     benazepril (LOTENSIN) 20 MG tablet Take 1 tablet (20 mg total) by mouth daily. 90 tablet 3   carvedilol (COREG) 25 MG tablet Take 0.5 tablets (12.5 mg total) by mouth 2 (two) times daily. 180 tablet 3   cetirizine (ZYRTEC) 10 MG tablet Take 10 mg by mouth daily as needed for allergies.      Coenzyme Q10 (CO Q 10) 100 MG CAPS Take 1 capsule by mouth daily.     ELIQUIS 5 MG TABS tablet TAKE 1 TABLET BY MOUTH TWICE DAILY 180 tablet 2   fenofibrate (TRICOR) 48 MG tablet TAKE 1 TABLET BY MOUTH DAILY 90 tablet 3   magnesium oxide (MAG-OX) 400 (240 Mg) MG tablet Take 400 mg by mouth daily.     Multiple Vitamin (MULTIVITAMIN) tablet Take 1 tablet by mouth daily.     rosuvastatin (CRESTOR) 10 MG tablet TAKE 1 TABLET BY MOUTH DAILY 90 tablet 1   sildenafil (REVATIO) 20 MG tablet Take 1-5 tablets by mouth daily as needed 90 tablet 5   No current facility-administered medications on file prior to visit.    Allergies  Allergen Reactions   Shellfish Allergy Other (See Comments)    flulike symptoms    Sulfa Antibiotics Other (See Comments)    "very sick"   Meperidine And Related Itching   Misc. Sulfonamide Containing Compounds      Assessment/Plan:  1. Hypertension - BP mildly elevated in clinic above goal < 130/1mmHg however pt reports white coat HTN. Home readings are well controlled for the most part with occasional low or high reading. Will continue current meds of benazepril 20mg  daily and carvedilol 25mg  BID with note to take carvedilol 12.5mg  if SBP < 110 and skip carvedilol if SBP < 100. F/u with PharmD as needed.  Rory Xiang E. Bronwyn Belasco, PharmD, BCACP, CPP [facility] [address], [place], [place] [address] Phone: [phone]; Fax: [phone] 07/05/2023 10:56 AM

## 2023-07-08 DIAGNOSIS — H01004 Unspecified blepharitis left upper eyelid: Secondary | ICD-10-CM | POA: Diagnosis not present

## 2023-07-08 DIAGNOSIS — H01002 Unspecified blepharitis right lower eyelid: Secondary | ICD-10-CM | POA: Diagnosis not present

## 2023-07-08 DIAGNOSIS — H01001 Unspecified blepharitis right upper eyelid: Secondary | ICD-10-CM | POA: Diagnosis not present

## 2023-07-08 DIAGNOSIS — H25813 Combined forms of age-related cataract, bilateral: Secondary | ICD-10-CM | POA: Diagnosis not present

## 2023-07-15 DIAGNOSIS — C4361 Malignant melanoma of right upper limb, including shoulder: Secondary | ICD-10-CM | POA: Diagnosis not present

## 2023-07-21 ENCOUNTER — Other Ambulatory Visit: Payer: Self-pay | Admitting: Cardiovascular Disease

## 2023-07-22 DIAGNOSIS — H25812 Combined forms of age-related cataract, left eye: Secondary | ICD-10-CM | POA: Diagnosis not present

## 2023-07-23 ENCOUNTER — Encounter (HOSPITAL_COMMUNITY)
Admission: RE | Admit: 2023-07-23 | Discharge: 2023-07-23 | Disposition: A | Discharge status: Home / Self Care | Payer: Medicare Other | Source: Ambulatory Visit | Attending: Ophthalmology | Admitting: Ophthalmology

## 2023-07-23 NOTE — H&P (Signed)
Surgical History & Physical  Patient Name: Henry Page DOB: 1946-12-20  Surgery: Cataract extraction with intraocular lens implant phacoemulsification; Left Eye  Surgeon: [name] MD Surgery Date:  07-26-23 Pre-Op Date:  07-08-23  HPI: A 33 Yr. old male patient 1. The patient is here for a cataract evaluation, referred from Dr. Daphine Deutscher. Since the last visit, the affected area decreased. The patient's vision is blurry. The condition's severity is constant. The patient has difficulty reading captions on tv, difficulty driving at night due to halos/glare, and difficulty reading small print on medicine bottles/labels/filling out paperwork. This is negatively affecting the patient's quality of life and the patient is unable to function adequately in life with the current level of vision. HPI Completed by Dr. [name]  Medical History: Heart Problem  Review of Systems Negative Allergic/Immunologic Negative Cardiovascular Negative Constitutional Negative Ear, Nose, Mouth & Throat Negative Endocrine Negative Eyes Negative Gastrointestinal Negative Genitourinary Negative Hemotologic/Lymphatic Negative Integumentary Negative Musculoskeletal Negative Neurological Negative Psychiatry Negative Respiratory  Social   Never smoked   Medication fenofibrate nanocrystallized ,  Eliquis ,  carvedilol ,  carvedilol ,  hydrocodone-acetaminophen ,  benazepril ,  doxycycline hyclate ,  rosuvastatin ,   Sx/Procedures Art valve repair, Skin melanomas removed, Tonsilectomy,   Drug Allergies  Sulfa,  sulfa ,   History & Physical: Heent: cataract; left eye NECK: supple without bruits LUNGS: lungs clear to auscultation CV: regular rate and rhythm Abdomen: soft and non-tender Impression & Plan: Assessment: 1.  COMBINED FORMS AGE RELATED CATARACT; Both Eyes (H25.813) 2.  BLEPHARITIS; Right Upper Lid, Right Lower Lid, Left Upper Lid, Left Lower Lid (H01.001, H01.002,H01.004,H01.005) 3.   DERMATOCHALASIS, no surgery; Right Upper Lid, Left Upper Lid (H02.831, H02.834) 4.  Pinguecula; Both Eyes (H11.153) 5.  ASTIGMATISM, REGULAR; Both Eyes (H52.223)  Plan: 1.  Cataract accounts for the patient's decreased vision. This visual impairment is not correctable with a tolerable change in glasses or contact lenses. Cataract surgery with an implantation of a new lens should significantly improve the visual and functional status of the patient. Discussed all risks, benefits, alternatives, and potential complications. Discussed the procedures and recovery. Patient desires to have surgery. A-scan ordered and performed today for intra-ocular lens calculations. The surgery will be performed in order to improve vision for driving, reading, and for eye examinations. Recommend phacoemulsification with intra-ocular lens. Recommend Dextenza for post-operative pain and inflammation. Left Eye worse - first. Dilates poorly - shugarcaine by protocol. Malyugin Ring. Omidira. Toric Lens. Vivity or Eyhance.r  2.  Blepharitis is present - recommend regular lid cleaning.  3.  Asymptomatic, recommend observation for now. Findings, prognosis and treatment options reviewed.  4.  Observe; Artificial tears as needed for irritation.  5.  Worse OS - toric OS only.

## 2023-07-26 ENCOUNTER — Encounter (HOSPITAL_COMMUNITY): Payer: Self-pay | Admitting: Ophthalmology

## 2023-07-26 ENCOUNTER — Ambulatory Visit (HOSPITAL_BASED_OUTPATIENT_CLINIC_OR_DEPARTMENT_OTHER): Payer: Medicare Other | Admitting: Certified Registered"

## 2023-07-26 ENCOUNTER — Ambulatory Visit (HOSPITAL_COMMUNITY): Payer: Medicare Other | Admitting: Certified Registered"

## 2023-07-26 ENCOUNTER — Encounter (HOSPITAL_COMMUNITY)
Admission: RE | Disposition: A | Discharge status: Home / Self Care | Payer: Self-pay | Source: Home / Self Care | Attending: Ophthalmology

## 2023-07-26 ENCOUNTER — Ambulatory Visit (HOSPITAL_COMMUNITY)
Admission: RE | Admit: 2023-07-26 | Discharge: 2023-07-26 | Disposition: A | Discharge status: Home / Self Care | Payer: Medicare Other | Attending: Ophthalmology | Admitting: Ophthalmology

## 2023-07-26 DIAGNOSIS — I4891 Unspecified atrial fibrillation: Secondary | ICD-10-CM | POA: Insufficient documentation

## 2023-07-26 DIAGNOSIS — I480 Paroxysmal atrial fibrillation: Secondary | ICD-10-CM | POA: Diagnosis not present

## 2023-07-26 DIAGNOSIS — I10 Essential (primary) hypertension: Secondary | ICD-10-CM | POA: Insufficient documentation

## 2023-07-26 DIAGNOSIS — G4730 Sleep apnea, unspecified: Secondary | ICD-10-CM | POA: Insufficient documentation

## 2023-07-26 DIAGNOSIS — H01009 Unspecified blepharitis unspecified eye, unspecified eyelid: Secondary | ICD-10-CM | POA: Insufficient documentation

## 2023-07-26 DIAGNOSIS — I2510 Atherosclerotic heart disease of native coronary artery without angina pectoris: Secondary | ICD-10-CM

## 2023-07-26 DIAGNOSIS — Z951 Presence of aortocoronary bypass graft: Secondary | ICD-10-CM | POA: Diagnosis not present

## 2023-07-26 DIAGNOSIS — H259 Unspecified age-related cataract: Secondary | ICD-10-CM | POA: Diagnosis not present

## 2023-07-26 DIAGNOSIS — Z7901 Long term (current) use of anticoagulants: Secondary | ICD-10-CM | POA: Diagnosis not present

## 2023-07-26 DIAGNOSIS — H25812 Combined forms of age-related cataract, left eye: Secondary | ICD-10-CM | POA: Insufficient documentation

## 2023-07-26 DIAGNOSIS — H52202 Unspecified astigmatism, left eye: Secondary | ICD-10-CM | POA: Diagnosis not present

## 2023-07-26 HISTORY — PX: CATARACT EXTRACTION W/PHACO: SHX586

## 2023-07-26 SURGERY — PHACOEMULSIFICATION, CATARACT, WITH IOL INSERTION
Anesthesia: Monitor Anesthesia Care | Site: Eye | Laterality: Left

## 2023-07-26 MED ORDER — TROPICAMIDE 1 % OP SOLN
1.0000 [drp] | OPHTHALMIC | Status: AC | PRN
  Administered 2023-07-26 (×3): 1 [drp] via OPHTHALMIC

## 2023-07-26 MED ORDER — STERILE WATER FOR IRRIGATION IR SOLN
Status: DC | PRN
  Administered 2023-07-26: 1

## 2023-07-26 MED ORDER — SODIUM HYALURONATE 23MG/ML IO SOSY
PREFILLED_SYRINGE | INTRAOCULAR | Status: DC | PRN
  Administered 2023-07-26: .6 mL via INTRAOCULAR

## 2023-07-26 MED ORDER — MIDAZOLAM HCL 2 MG/2ML IJ SOLN
INTRAMUSCULAR | Status: AC
  Filled 2023-07-26: qty 2

## 2023-07-26 MED ORDER — LIDOCAINE HCL 3.5 % OP GEL
1.0000 | Freq: Once | OPHTHALMIC | Status: AC
  Administered 2023-07-26: 1 via OPHTHALMIC

## 2023-07-26 MED ORDER — TETRACAINE HCL 0.5 % OP SOLN
1.0000 [drp] | OPHTHALMIC | Status: AC | PRN
  Administered 2023-07-26 (×3): 1 [drp] via OPHTHALMIC

## 2023-07-26 MED ORDER — PHENYLEPHRINE HCL 2.5 % OP SOLN
1.0000 [drp] | OPHTHALMIC | Status: AC | PRN
  Administered 2023-07-26 (×3): 1 [drp] via OPHTHALMIC

## 2023-07-26 MED ORDER — SODIUM HYALURONATE 10 MG/ML IO SOLUTION
PREFILLED_SYRINGE | INTRAOCULAR | Status: DC | PRN
  Administered 2023-07-26: .85 mL via INTRAOCULAR

## 2023-07-26 MED ORDER — EPINEPHRINE PF 1 MG/ML IJ SOLN
INTRAOCULAR | Status: DC | PRN
  Administered 2023-07-26: 500 mL

## 2023-07-26 MED ORDER — NEOMYCIN-POLYMYXIN-DEXAMETH 3.5-10000-0.1 OP SUSP: OPHTHALMIC | Status: DC | PRN

## 2023-07-26 MED ORDER — BSS IO SOLN
INTRAOCULAR | Status: DC | PRN
  Administered 2023-07-26: 15 mL via INTRAOCULAR

## 2023-07-26 MED ORDER — LIDOCAINE HCL (PF) 1 % IJ SOLN
INTRAOCULAR | Status: DC | PRN
  Administered 2023-07-26: 1 mL via OPHTHALMIC

## 2023-07-26 MED ORDER — POVIDONE-IODINE 5 % OP SOLN
OPHTHALMIC | Status: DC | PRN
  Administered 2023-07-26: 1 via OPHTHALMIC

## 2023-07-26 SURGICAL SUPPLY — 16 items
CATARACT SUITE SIGHTPATH (MISCELLANEOUS) ×1
CLOTH BEACON ORANGE TIMEOUT ST (SAFETY) ×2 IMPLANT
EYE SHIELD UNIVERSAL CLEAR (GAUZE/BANDAGES/DRESSINGS) IMPLANT
FEE CATARACT SUITE SIGHTPATH (MISCELLANEOUS) ×2 IMPLANT
GLOVE BIOGEL PI IND STRL 7.0 (GLOVE) ×4 IMPLANT
LENS IOL EYHANCE TORIC II 21.5 ×1 IMPLANT
LENS IOL EYHANCE TRC 150 21.5 IMPLANT
LENS IOL EYHNC TORIC 150 21.5 ×1 IMPLANT
NDL HYPO 18GX1.5 BLUNT FILL (NEEDLE) ×2 IMPLANT
NEEDLE HYPO 18GX1.5 BLUNT FILL (NEEDLE) ×1
PAD ARMBOARD 7.5X6 YLW CONV (MISCELLANEOUS) ×2 IMPLANT
POSITIONER HEAD 8X9X4 ADT (SOFTGOODS) ×2 IMPLANT
SIGHTPATH CAT PROC W REG LENS (Ophthalmic Related) IMPLANT
SYR TB 1ML LL NO SAFETY (SYRINGE) ×2 IMPLANT
TAPE SURG TRANSPORE 1 IN (GAUZE/BANDAGES/DRESSINGS) IMPLANT
WATER STERILE IRR 250ML POUR (IV SOLUTION) ×2 IMPLANT

## 2023-07-26 NOTE — Anesthesia Preprocedure Evaluation (Signed)
Anesthesia Evaluation  Patient identified by MRN, date of birth, ID band Patient awake    Reviewed: Allergy & Precautions, H&P , NPO status , Patient's Chart, lab work & pertinent test results, reviewed documented beta blocker date and time   Airway Mallampati: II  TM Distance: >3 FB Neck ROM: full    Dental no notable dental hx.    Pulmonary neg pulmonary ROS, sleep apnea    Pulmonary exam normal breath sounds clear to auscultation       Cardiovascular Exercise Tolerance: Good hypertension, + CAD and + CABG  + dysrhythmias Atrial Fibrillation  Rhythm:irregular Rate:Normal     Neuro/Psych   Anxiety     negative neurological ROS  negative psych ROS   GI/Hepatic negative GI ROS, Neg liver ROS,,,  Endo/Other  negative endocrine ROS    Renal/GU Renal diseasenegative Renal ROS  negative genitourinary   Musculoskeletal   Abdominal   Peds  Hematology negative hematology ROS (+)   Anesthesia Other Findings   Reproductive/Obstetrics negative OB ROS                             Anesthesia Physical Anesthesia Plan  ASA: 3  Anesthesia Plan: MAC   Post-op Pain Management:    Induction:   PONV Risk Score and Plan:   Airway Management Planned:   Additional Equipment:   Intra-op Plan:   Post-operative Plan:   Informed Consent: I have reviewed the patients History and Physical, chart, labs and discussed the procedure including the risks, benefits and alternatives for the proposed anesthesia with the patient or authorized representative who has indicated his/her understanding and acceptance.     Dental Advisory Given  Plan Discussed with: CRNA  Anesthesia Plan Comments:        Anesthesia Quick Evaluation

## 2023-07-26 NOTE — Discharge Instructions (Addendum)
Please discharge patient when stable, will follow up today with Dr. Wrzosek at the [facility] [place] office immediately following discharge.  Leave shield in place until visit.  All paperwork with discharge instructions will be given at the office.  [facility] Address:  [address]  [place], [place] [address]  

## 2023-07-26 NOTE — Op Note (Signed)
Date of procedure: 07/26/23  Pre-operative diagnosis: Visually significant age-related combined cataract, Left Eye; Visually Significant Astigmatism, Left Eye (H25.812)  Post-operative diagnosis: Visually significant age-related cataract, Left Eye; Visually Significant Astigmatism, Left Eye  Procedure: Removal of cataract via phacoemulsification and insertion of intra-ocular lens Laural Benes and Johnson DIU150 +21.5D into the capsular bag of the Left Eye  Attending surgeon: Rudy Jew. , MD, MA  Anesthesia: MAC, Topical Akten  Complications: None  Estimated Blood Loss: <65mL (minimal)  Specimens: None  Implants: As above  Indications:  Visually significant age-related cataract, Left Eye; Visually Significant Astigmatism, Left Eye  Procedure:  The patient was seen and identified in the pre-operative area. The operative eye was identified and dilated.  The operative eye was marked.  Pre-operative toric markers were used to mark the eye at 0 and 180 degrees. Topical anesthesia was administered to the operative eye.     The patient was then to the operative suite and placed in the supine position.  A timeout was performed confirming the patient, procedure to be performed, and all other relevant information.   The patient's face was prepped and draped in the usual fashion for intra-ocular surgery.  A lid speculum was placed into the operative eye and the surgical microscope moved into place and focused.  A superotemporal paracentesis was created using a 20 gauge paracentesis blade.  Shugarcaine was injected into the anterior chamber.  Viscoelastic was injected into the anterior chamber.  A temporal clear-corneal main wound incision was created using a 2.51mm microkeratome.  A continuous curvilinear capsulorrhexis was initiated using an irrigating cystitome and completed using capsulorrhexis forceps.  Hydrodissection and hydrodeliniation were performed.  Viscoelastic was injected into the anterior  chamber.  A phacoemulsification handpiece and a chopper as a second instrument were used to remove the nucleus and epinucleus. The irrigation/aspiration handpiece was used to remove any remaining cortical material.   The capsular bag was reinflated with viscoelastic, checked, and found to be intact.  The eye was marked to the per-op meridian.  The intraocular lens was inserted into the capsular bag and dialed into place using a Kuglen hook to 95 degrees.  The irrigation/aspiration handpiece was used to remove any remaining viscoelastic.  The clear corneal wound and paracentesis wounds were then hydrated and checked with Weck-Cels to be watertight. The lid-speculum and drape was removed, and the patient's face was cleaned with a wet and dry 4x4.  A clear shield was taped over the eye. The patient was taken to the post-operative care unit in good condition, having tolerated the procedure well.  Post-Op Instructions: The patient will follow up at [facility] for a same day post-operative evaluation and will receive all other orders and instructions.

## 2023-07-26 NOTE — Transfer of Care (Signed)
Immediate Anesthesia Transfer of Care Note  Patient: Henry Page  Procedure(s) Performed: CATARACT EXTRACTION PHACO AND INTRAOCULAR LENS PLACEMENT (IOC) (Left: Eye)  Patient Location: Short Stay  Anesthesia Type:MAC  Level of Consciousness: awake, alert , and oriented  Airway & Oxygen Therapy: Patient Spontanous Breathing  Post-op Assessment: Report given to RN and Post -op Vital signs reviewed and stable  Post vital signs: Reviewed and stable  Last Vitals:  Vitals Value Taken Time  BP    Temp    Pulse    Resp    SpO2      Last Pain:  Vitals:   07/26/23 1002  PainSc: 0-No pain         Complications: No notable events documented.

## 2023-07-26 NOTE — Interval H&P Note (Signed)
History and Physical Interval Note:  07/26/2023 11:13 AM  Henry Page  has presented today for surgery, with the diagnosis of combined forms age related cataract, left eye.  The various methods of treatment have been discussed with the patient and family. After consideration of risks, benefits and other options for treatment, the patient has consented to  Procedure(s): CATARACT EXTRACTION PHACO AND INTRAOCULAR LENS PLACEMENT (IOC) (Left) as a surgical intervention.  The patient's history has been reviewed, patient examined, no change in status, stable for surgery.  I have reviewed the patient's chart and labs.  Questions were answered to the patient's satisfaction.     [name]

## 2023-07-26 NOTE — Anesthesia Postprocedure Evaluation (Signed)
Anesthesia Post Note  Patient: Henry Page  Procedure(s) Performed: CATARACT EXTRACTION PHACO AND INTRAOCULAR LENS PLACEMENT (IOC) (Left: Eye)  Patient location during evaluation: Phase II Anesthesia Type: MAC Level of consciousness: awake Pain management: pain level controlled Vital Signs Assessment: post-procedure vital signs reviewed and stable Respiratory status: spontaneous breathing and respiratory function stable Cardiovascular status: blood pressure returned to baseline and stable Postop Assessment: no headache and no apparent nausea or vomiting Anesthetic complications: no Comments: Late entry   No notable events documented.   Last Vitals:  Vitals:   07/26/23 1016 07/26/23 1143  BP: (!) 141/85 (!) 148/76  Pulse:  69  Resp:  16  Temp:  36.6 C  SpO2:  99%    Last Pain:  Vitals:   07/26/23 1143  TempSrc: Oral  PainSc: 0-No pain                 Windell Norfolk

## 2023-07-26 NOTE — Anesthesia Procedure Notes (Signed)
Procedure Name: MAC Date/Time: 07/26/2023 11:20 AM  Performed by: Julian Reil, CRNAPre-anesthesia Checklist: Patient identified, Emergency Drugs available, Suction available and Patient being monitored Patient Re-evaluated:Patient Re-evaluated prior to induction Oxygen Delivery Method: Nasal cannula Placement Confirmation: positive ETCO2

## 2023-07-29 ENCOUNTER — Encounter (HOSPITAL_COMMUNITY): Payer: Self-pay | Admitting: Ophthalmology

## 2023-07-30 ENCOUNTER — Encounter (HOSPITAL_COMMUNITY): Payer: Self-pay

## 2023-07-31 ENCOUNTER — Encounter (HOSPITAL_COMMUNITY)
Admission: RE | Admit: 2023-07-31 | Discharge: 2023-07-31 | Disposition: A | Discharge status: Home / Self Care | Payer: Medicare Other | Source: Ambulatory Visit | Attending: Ophthalmology | Admitting: Ophthalmology

## 2023-08-01 DIAGNOSIS — H25811 Combined forms of age-related cataract, right eye: Secondary | ICD-10-CM | POA: Diagnosis not present

## 2023-08-01 NOTE — H&P (Signed)
Surgical History & Physical  Patient Name: Henry Page  DOB: Jan 06, 1946  Surgery: Cataract extraction with intraocular lens implant phacoemulsification; Right Eye Surgeon: [name] MD Surgery Date: 08/05/2023 Pre-Op Date: 08/01/2023  HPI: A 59 Yr. old male patient 1. The patient is returning after cataract surgery. The left eye is affected. Status post cataract surgery, which began 6 days ago: Since the last visit, the affected area is doing well. The patient's vision is improved. The complaint is associated with foreign body sensation. Patient is following medication instructions. 2. 2. The patient is returning for a cataract follow-up of the right eye. Since the last visit, the affected area is tolerating. The patient's vision is blurry. The condition's severity is constant. The complaint is associated with difficulty reading small print on medicine bottles/labels, difficulty seeing captions on tv, and difficulty with poor night vision. This is negatively affecting the patient's quality of life and the patient is unable to function adequately in life with the current level of vision. HPI Completed by Dr. [name]  Medical History:  Heart Problem  Review of Systems Negative Allergic/Immunologic Negative Cardiovascular Negative Constitutional Negative Ear, Nose, Mouth & Throat Negative Endocrine Negative Eyes Negative Gastrointestinal Negative Genitourinary Negative Hemotologic/Lymphatic Negative Integumentary Negative Musculoskeletal Negative Neurological Negative Psychiatry Negative Respiratory  Social Never smoked   Medication Prednisolone-moxiflox-bromfen,  fenofibrate nanocrystallized ,  Eliquis ,  carvedilol ,  carvedilol ,  hydrocodone-acetaminophen ,  benazepril ,  doxycycline hyclate ,  rosuvastatin   Sx/Procedures Phaco c IOL OS-Toric,  Art valve repair, Skin melanomas removed, Tonsilectomy  Drug Allergies  Sulfa  History & Physical: Heent: cataract OD,  PCL OS NECK: supple without bruits LUNGS: lungs clear to auscultation CV: regular rate and rhythm Abdomen: soft and non-tender  Impression & Plan: Assessment: 1.  CATARACT EXTRACTION STATUS; Left Eye (Z98.42) 2.  COMBINED FORMS AGE RELATED CATARACT; Right Eye (H25.811) 3.  Myopia ; Left Eye (H52.12)  Plan: 1.  1 week after cataract surgery. Doing well with improved vision and normal eye pressure. Call with any problems or concerns. Continue Pred-Moxi-Brom 2x/day for 3 more weeks.  2.  Cataract accounts for the patient's decreased vision. This visual impairment is not correctable with a tolerable change in glasses or contact lenses. Cataract surgery with an implantation of a new lens should significantly improve the visual and functional status of the patient. Discussed all risks, benefits, alternatives, and potential complications. Discussed the procedures and recovery. Patient desires to have surgery. A-scan ordered and performed today for intra-ocular lens calculations. The surgery will be performed in order to improve vision for driving, reading, and for eye examinations. Recommend phacoemulsification with intra-ocular lens. Recommend Dextenza for post-operative pain and inflammation. Right Eye. Surgery required to correct imbalance of vision. Dilates well - shugarcaine by protocol.  3. Monitor

## 2023-08-05 ENCOUNTER — Ambulatory Visit (HOSPITAL_COMMUNITY)
Admission: RE | Admit: 2023-08-05 | Discharge: 2023-08-05 | Disposition: A | Discharge status: Home / Self Care | Payer: Medicare Other | Attending: Ophthalmology | Admitting: Ophthalmology

## 2023-08-05 ENCOUNTER — Other Ambulatory Visit: Payer: Self-pay

## 2023-08-05 ENCOUNTER — Encounter (HOSPITAL_COMMUNITY)
Admission: RE | Disposition: A | Discharge status: Home / Self Care | Payer: Self-pay | Source: Home / Self Care | Attending: Ophthalmology

## 2023-08-05 ENCOUNTER — Ambulatory Visit (HOSPITAL_COMMUNITY): Payer: Medicare Other | Admitting: Anesthesiology

## 2023-08-05 ENCOUNTER — Encounter (HOSPITAL_COMMUNITY): Payer: Self-pay | Admitting: Ophthalmology

## 2023-08-05 ENCOUNTER — Ambulatory Visit (HOSPITAL_BASED_OUTPATIENT_CLINIC_OR_DEPARTMENT_OTHER): Payer: Medicare Other | Admitting: Anesthesiology

## 2023-08-05 DIAGNOSIS — H5212 Myopia, left eye: Secondary | ICD-10-CM | POA: Insufficient documentation

## 2023-08-05 DIAGNOSIS — N183 Chronic kidney disease, stage 3 unspecified: Secondary | ICD-10-CM | POA: Diagnosis not present

## 2023-08-05 DIAGNOSIS — Z9842 Cataract extraction status, left eye: Secondary | ICD-10-CM | POA: Insufficient documentation

## 2023-08-05 DIAGNOSIS — I2510 Atherosclerotic heart disease of native coronary artery without angina pectoris: Secondary | ICD-10-CM | POA: Diagnosis not present

## 2023-08-05 DIAGNOSIS — H25811 Combined forms of age-related cataract, right eye: Secondary | ICD-10-CM

## 2023-08-05 DIAGNOSIS — I129 Hypertensive chronic kidney disease with stage 1 through stage 4 chronic kidney disease, or unspecified chronic kidney disease: Secondary | ICD-10-CM | POA: Diagnosis not present

## 2023-08-05 DIAGNOSIS — I4891 Unspecified atrial fibrillation: Secondary | ICD-10-CM | POA: Diagnosis not present

## 2023-08-05 HISTORY — PX: CATARACT EXTRACTION W/PHACO: SHX586

## 2023-08-05 SURGERY — PHACOEMULSIFICATION, CATARACT, WITH IOL INSERTION
Anesthesia: Monitor Anesthesia Care | Laterality: Right

## 2023-08-05 MED ORDER — LIDOCAINE HCL (PF) 1 % IJ SOLN
INTRAOCULAR | Status: DC | PRN
  Administered 2023-08-05: 1 mL via OPHTHALMIC

## 2023-08-05 MED ORDER — EPINEPHRINE PF 1 MG/ML IJ SOLN
INTRAMUSCULAR | Status: AC
  Filled 2023-08-05: qty 1

## 2023-08-05 MED ORDER — POVIDONE-IODINE 5 % OP SOLN
OPHTHALMIC | Status: DC | PRN
  Administered 2023-08-05: 1 via OPHTHALMIC

## 2023-08-05 MED ORDER — TETRACAINE HCL 0.5 % OP SOLN
1.0000 [drp] | OPHTHALMIC | Status: AC | PRN
  Administered 2023-08-05 (×3): 1 [drp] via OPHTHALMIC

## 2023-08-05 MED ORDER — STERILE WATER FOR IRRIGATION IR SOLN
Status: DC | PRN
  Administered 2023-08-05: 1

## 2023-08-05 MED ORDER — PHENYLEPHRINE HCL 2.5 % OP SOLN
1.0000 [drp] | OPHTHALMIC | Status: AC | PRN
  Administered 2023-08-05 (×3): 1 [drp] via OPHTHALMIC

## 2023-08-05 MED ORDER — BSS IO SOLN
INTRAOCULAR | Status: DC | PRN
  Administered 2023-08-05: 15 mL via INTRAOCULAR

## 2023-08-05 MED ORDER — EPINEPHRINE PF 1 MG/ML IJ SOLN
INTRAOCULAR | Status: DC | PRN
  Administered 2023-08-05: 500 mL

## 2023-08-05 MED ORDER — SODIUM HYALURONATE 10 MG/ML IO SOLUTION
PREFILLED_SYRINGE | INTRAOCULAR | Status: DC | PRN
  Administered 2023-08-05: .85 mL via INTRAOCULAR

## 2023-08-05 MED ORDER — TROPICAMIDE 1 % OP SOLN
1.0000 [drp] | OPHTHALMIC | Status: AC | PRN
  Administered 2023-08-05 (×3): 1 [drp] via OPHTHALMIC

## 2023-08-05 MED ORDER — LIDOCAINE HCL 3.5 % OP GEL
1.0000 | Freq: Once | OPHTHALMIC | Status: AC
  Administered 2023-08-05: 1 via OPHTHALMIC

## 2023-08-05 MED ORDER — SODIUM HYALURONATE 23MG/ML IO SOSY
PREFILLED_SYRINGE | INTRAOCULAR | Status: DC | PRN
  Administered 2023-08-05: .6 mL via INTRAOCULAR

## 2023-08-05 MED ORDER — EPINEPHRINE PF 1 MG/ML IJ SOLN
INTRAMUSCULAR | Status: AC
  Filled 2023-08-05: qty 2

## 2023-08-05 SURGICAL SUPPLY — 13 items
CATARACT SUITE SIGHTPATH (MISCELLANEOUS) ×1
CLOTH BEACON ORANGE TIMEOUT ST (SAFETY) ×2 IMPLANT
EYE SHIELD UNIVERSAL CLEAR (GAUZE/BANDAGES/DRESSINGS) IMPLANT
FEE CATARACT SUITE SIGHTPATH (MISCELLANEOUS) ×2 IMPLANT
GLOVE BIOGEL PI IND STRL 7.0 (GLOVE) ×4 IMPLANT
LENS IOL TECNIS EYHANCE 22.0 (Intraocular Lens) IMPLANT
NDL HYPO 18GX1.5 BLUNT FILL (NEEDLE) ×2 IMPLANT
NEEDLE HYPO 18GX1.5 BLUNT FILL (NEEDLE) ×1
PAD ARMBOARD 7.5X6 YLW CONV (MISCELLANEOUS) ×2 IMPLANT
POSITIONER HEAD 8X9X4 ADT (SOFTGOODS) ×2 IMPLANT
SYR TB 1ML LL NO SAFETY (SYRINGE) ×2 IMPLANT
TAPE SURG TRANSPORE 1 IN (GAUZE/BANDAGES/DRESSINGS) IMPLANT
WATER STERILE IRR 250ML POUR (IV SOLUTION) ×2 IMPLANT

## 2023-08-05 NOTE — Discharge Instructions (Addendum)
Please discharge patient when stable, will follow up today with Dr. Wrzosek at the [facility] [place] office immediately following discharge.  Leave shield in place until visit.  All paperwork with discharge instructions will be given at the office.  [facility] Address:  [address]  [place], [place] [address]  

## 2023-08-05 NOTE — Anesthesia Postprocedure Evaluation (Signed)
Anesthesia Post Note  Patient: Henry Page  Procedure(s) Performed: CATARACT EXTRACTION PHACO AND INTRAOCULAR LENS PLACEMENT (IOC) (Right)  Patient location during evaluation: Phase II Anesthesia Type: MAC Level of consciousness: awake and alert and oriented Pain management: pain level controlled Vital Signs Assessment: post-procedure vital signs reviewed and stable Respiratory status: spontaneous breathing, nonlabored ventilation and respiratory function stable Cardiovascular status: blood pressure returned to baseline and stable Postop Assessment: no apparent nausea or vomiting Anesthetic complications: no  No notable events documented.   Last Vitals:  Vitals:   08/05/23 1012 08/05/23 1112  BP: (!) 146/96 (!) 159/98  Pulse:  65  Resp:  (!) 9  Temp:  36.7 C  SpO2:  100%    Last Pain:  Vitals:   08/05/23 1112  TempSrc: Oral  PainSc: 0-No pain                  C 

## 2023-08-05 NOTE — Anesthesia Procedure Notes (Signed)
Date/Time: 08/05/2023 10:51 AM  Performed by: Franco Nones, CRNAPre-anesthesia Checklist: Patient identified, Emergency Drugs available, Suction available, Timeout performed and Patient being monitored Patient Re-evaluated:Patient Re-evaluated prior to induction Oxygen Delivery Method: Nasal Cannula

## 2023-08-05 NOTE — Anesthesia Preprocedure Evaluation (Signed)
Anesthesia Evaluation  Patient identified by MRN, date of birth, ID band Patient awake    Reviewed: Allergy & Precautions, H&P , NPO status , Patient's Chart, lab work & pertinent test results  Airway Mallampati: II  TM Distance: >3 FB Neck ROM: Full    Dental  (+) Missing, Dental Advisory Given   Pulmonary sleep apnea    Pulmonary exam normal breath sounds clear to auscultation       Cardiovascular Exercise Tolerance: Good hypertension, Pt. on medications + CAD and + CABG  Normal cardiovascular exam+ dysrhythmias Atrial Fibrillation + Valvular Problems/Murmurs (mitral valve repair, patent foramen ovale closure) MR and MVP  Rhythm:Regular Rate:Normal     Neuro/Psych  PSYCHIATRIC DISORDERS Anxiety     negative neurological ROS     GI/Hepatic negative GI ROS, Neg liver ROS,,,  Endo/Other  negative endocrine ROS    Renal/GU Renal InsufficiencyRenal disease  negative genitourinary   Musculoskeletal negative musculoskeletal ROS (+)    Abdominal   Peds negative pediatric ROS (+)  Hematology negative hematology ROS (+)   Anesthesia Other Findings   Reproductive/Obstetrics negative OB ROS                             Anesthesia Physical Anesthesia Plan  ASA: 3  Anesthesia Plan: MAC   Post-op Pain Management: Minimal or no pain anticipated   Induction: Intravenous  PONV Risk Score and Plan: Treatment may vary due to age or medical condition  Airway Management Planned: Nasal Cannula and Natural Airway  Additional Equipment:   Intra-op Plan:   Post-operative Plan:   Informed Consent: I have reviewed the patients History and Physical, chart, labs and discussed the procedure including the risks, benefits and alternatives for the proposed anesthesia with the patient or authorized representative who has indicated his/her understanding and acceptance.     Dental advisory given  Plan  Discussed with: CRNA and Surgeon  Anesthesia Plan Comments:        Anesthesia Quick Evaluation

## 2023-08-05 NOTE — Op Note (Addendum)
Date of procedure: 08/05/23  Pre-operative diagnosis:  Visually significant combined form age-related cataract, Right Eye (H25.811)  Post-operative diagnosis:  Visually significant combined form age-related cataract, Right Eye (H25.811)  Procedure: Removal of cataract via phacoemulsification and insertion of intra-ocular lens [employer] DIB00 +22.0D into the capsular bag of the Right Eye  Attending surgeon: Rudy Jew. , MD, MA  Anesthesia: MAC, Topical Akten  Complications: None  Estimated Blood Loss: <40mL (minimal)  Specimens: None  Implants: As above  Indications:  Visually significant age-related cataract, Right Eye  Procedure:  The patient was seen and identified in the pre-operative area. The operative eye was identified and dilated.  The operative eye was marked.  Topical anesthesia was administered to the operative eye.     The patient was then to the operative suite and placed in the supine position.  A timeout was performed confirming the patient, procedure to be performed, and all other relevant information.   The patient's face was prepped and draped in the usual fashion for intra-ocular surgery.  A lid speculum was placed into the operative eye and the surgical microscope moved into place and focused.  A superotemporal paracentesis was created using a 20 gauge paracentesis blade.  Shugarcaine was injected into the anterior chamber.  Viscoelastic was injected into the anterior chamber.  A temporal clear-corneal main wound incision was created using a 2.4mm microkeratome.  A continuous curvilinear capsulorrhexis was initiated using an irrigating cystitome and completed using capsulorrhexis forceps.  Hydrodissection and hydrodeliniation were performed.  Viscoelastic was injected into the anterior chamber.  A phacoemulsification handpiece and a chopper as a second instrument were used to remove the nucleus and epinucleus. The irrigation/aspiration handpiece was used to  remove any remaining cortical material.   The capsular bag was reinflated with viscoelastic, checked, and found to be intact.  The intraocular lens was inserted into the capsular bag.  The irrigation/aspiration handpiece was used to remove any remaining viscoelastic.  The clear corneal wound and paracentesis wounds were then hydrated and checked with Weck-Cels to be watertight. The lid-speculum was removed.  The drape was removed.  The patient's face was cleaned with a wet and dry 4x4. A clear shield was taped over the eye. The patient was taken to the post-operative care unit in good condition, having tolerated the procedure well.  Post-Op Instructions: The patient will follow up at [facility] for a same day post-operative evaluation and will receive all other orders and instructions.

## 2023-08-05 NOTE — Transfer of Care (Signed)
Immediate Anesthesia Transfer of Care Note  Patient: SHAWNMICHAEL ANDERS  Procedure(s) Performed: CATARACT EXTRACTION PHACO AND INTRAOCULAR LENS PLACEMENT (IOC) (Right)  Patient Location: Short Stay  Anesthesia Type:MAC  Level of Consciousness: awake  Airway & Oxygen Therapy: Patient Spontanous Breathing  Post-op Assessment: Report given to RN and Post -op Vital signs reviewed and stable  Post vital signs: Reviewed and stable  Last Vitals:  Vitals Value Taken Time  BP 159/98 08/05/23 1112  Temp 36.7 C 08/05/23 1112  Pulse 65 08/05/23 1112  Resp 9 08/05/23 1112  SpO2 100 % 08/05/23 1112    Last Pain:  Vitals:   08/05/23 1112  TempSrc: Oral  PainSc: 0-No pain         Complications: No notable events documented.

## 2023-08-05 NOTE — Interval H&P Note (Signed)
History and Physical Interval Note:  08/05/2023 10:45 AM  Henry Page  has presented today for surgery, with the diagnosis of combined forms age related cataract, right eye.  The various methods of treatment have been discussed with the patient and family. After consideration of risks, benefits and other options for treatment, the patient has consented to  Procedure(s) with comments: CATARACT EXTRACTION PHACO AND INTRAOCULAR LENS PLACEMENT (IOC) (Right) - pt would like to be the last right eye as a surgical intervention.  The patient's history has been reviewed, patient examined, no change in status, stable for surgery.  I have reviewed the patient's chart and labs.  Questions were answered to the patient's satisfaction.     [name]

## 2023-08-07 ENCOUNTER — Encounter (HOSPITAL_COMMUNITY): Payer: Self-pay | Admitting: Ophthalmology

## 2023-08-14 ENCOUNTER — Telehealth: Payer: Self-pay | Admitting: Cardiovascular Disease

## 2023-08-14 DIAGNOSIS — E782 Mixed hyperlipidemia: Secondary | ICD-10-CM

## 2023-08-14 DIAGNOSIS — I4819 Other persistent atrial fibrillation: Secondary | ICD-10-CM

## 2023-08-14 DIAGNOSIS — I10 Essential (primary) hypertension: Secondary | ICD-10-CM

## 2023-08-14 DIAGNOSIS — I2510 Atherosclerotic heart disease of native coronary artery without angina pectoris: Secondary | ICD-10-CM

## 2023-08-14 DIAGNOSIS — Z9889 Other specified postprocedural states: Secondary | ICD-10-CM

## 2023-08-14 MED ORDER — BENAZEPRIL HCL 20 MG PO TABS
20.0000 mg | ORAL_TABLET | Freq: Two times a day (BID) | ORAL | 3 refills | Status: DC
Start: 2023-08-14 — End: 2024-03-10

## 2023-08-14 NOTE — Telephone Encounter (Signed)
Lifestyle likely contributing to elevated BP with sodium intake, alcohol intake, and little physical activity. Carvedilol is more effective in lowering BP than metoprolol is due to its alpha effects. This med change was made 4 months ago and he noted improvement in his BP after, actually was having some occasional low BP readings.   Spoke with pt, confirmed med compliance. States BP has been higher for the past week, has increased from 130-140 to 160-180 over the past week. A month ago had a single reading in the 80s/50s but thinks he was dehydrated.   Discussed with higher readings recently can either increase his benazepril from 20mg  daily to 20mg  BID or add on amlodipine. He prefers to increase benazepril so that he doesn't have to start a new medication. He is aware to take carvedilol 25mg  BID and benazepril 20mg  BID, new rx sent to pharmacy. He is concerned HR is a bit higher in the 80s compared to where it was before, had previously given him parameters to decrease/skip a dose if needed for low BP. Instead, will change this parameter to his benazepril. If his SBP is < 110, he can take 1/2 tab of benazepril, if it is < 100, he can skip his dose of benazepril. He sees Dr Excell Seltzer in 3 weeks, added on BMET to recheck that day.  Also reviewed foods that are high in sodium to avoid including restaurant food, canned food, frozen microwave meals, deli meat, and processed snacks/chips. Pt states "this is everything I eat." Advised on dietary improvements. Pt and wife appreciative for the assistance.

## 2023-08-14 NOTE — Telephone Encounter (Signed)
Pt c/o BP issue: STAT if pt c/o blurred vision, one-sided weakness or slurred speech  1. What are your last 5 BP readings? 180/104 - Today  2. Are you having any other symptoms (ex. Dizziness, headache, blurred vision, passed out)? Dizziness  3. What is your BP issue? Pt's wife states that pt's BP had been elevated for the last 3 days. She would like a callback regarding this matter. Please advise

## 2023-08-14 NOTE — Telephone Encounter (Signed)
Returned call to wife and patient via speakerphone. Reports supper last night was a chicken salad sandwich. Questioned if he'd eaten anything with it and he admits potato chips. Breakfast this morning was a cereal bar w/decaf coffee. States BP has been consistently running 160's systolic in the mornings prior to meds. Explained that the before medication reading is not concerning, and likely the reading he got this morning was a direct result of sodium intake last night. He doesn't feel this is the answer, but admits its possible, but repeatedly states "I eat horribly, but my diet hasn't changed." He feels his BP was better controlled on Lopressor, stating it was common that it would be 120's systolic and he never sees it that low anymore (currently on Carvedilol 25 BID). States that he does have "a drink or two most nights"  of scotch, but again states this has always been his norm and he cannot attest to drinking any less while on the Metoprolol which was better. Denies caffeine intake at all. States that he doesn't do much in the way of exercise, which he admits is different than in the past, but again, feels that this is not why his BP is trending higher. He mentions that his HR is running higher "mostly in the low 70's now" on the Coreg, but I explained that this is still a normal HR and not concerning, however he states he used to be running in the 60's all the time. Seen by Pharm[facility] HTN clinic on 04/22/23 and metoprolol was switched to Coreg d/t BP still at 140/84.  Patient is very anxious and repeatedly states that he feels to wake up with a BP in the 180's means it was much higher hours before and he could have something happen in his sleep. Condones dizziness episodes that he states mostly happens when he stands up and has been occurring multiple times per day. Wife is concerned he's going to fall or pass out and get hurt. Discussed possibility of changing BP medication, but that the dizziness could  potentially worsen if we're lowering BP, so they understand to monitor that. Last seen by Pharm[facility] clinic on 07/05/23 w/PRN f/u because he had been having issues with hypotension that seemed to be stabilized and he was given parameters on medication adjustment with Coreg. Routing to PharmD.

## 2023-08-14 NOTE — Telephone Encounter (Signed)
Returned call to wife, pt also in background listening and part of this call conversation. Pt took his morning medications around 8/830.  Since about 10:45 am (currently 11:30) pt has taken several BPs: 180/104 170/102 160/100 154/101 156/96 147/93 Only symptom is dizziness upon standing. Pt is concerned b/c it seems his numbers have worsened since seeing HTN clinic.  Informed that the good news is his numbers are coming down. Advised not to take BP so frequently. Informed that I would forward this to Dr. Excell Seltzer & nurse, along w/ pharmD for advisement. Aware someone will call them back today. They are agreeable to plan.

## 2023-09-09 ENCOUNTER — Telehealth: Payer: Self-pay | Admitting: *Deleted

## 2023-09-09 ENCOUNTER — Ambulatory Visit: Payer: Medicare Other | Admitting: Cardiovascular Disease

## 2023-09-09 ENCOUNTER — Encounter: Payer: Self-pay | Admitting: Cardiovascular Disease

## 2023-09-09 ENCOUNTER — Ambulatory Visit: Payer: Medicare Other | Attending: Cardiovascular Disease | Admitting: Cardiovascular Disease

## 2023-09-09 ENCOUNTER — Ambulatory Visit: Payer: Medicare Other

## 2023-09-09 VITALS — Ht 70.0 in | Wt 188.0 lb

## 2023-09-09 DIAGNOSIS — I10 Essential (primary) hypertension: Secondary | ICD-10-CM

## 2023-09-09 DIAGNOSIS — I4819 Other persistent atrial fibrillation: Secondary | ICD-10-CM | POA: Diagnosis not present

## 2023-09-09 DIAGNOSIS — I2510 Atherosclerotic heart disease of native coronary artery without angina pectoris: Secondary | ICD-10-CM

## 2023-09-09 DIAGNOSIS — Z9889 Other specified postprocedural states: Secondary | ICD-10-CM

## 2023-09-09 DIAGNOSIS — R400 Somnolence: Secondary | ICD-10-CM

## 2023-09-09 NOTE — Patient Instructions (Signed)
Medication Instructions:  Your physician recommends that you continue on your current medications as directed. Please refer to the Current Medication list given to you today.  *If you need a refill on your cardiac medications before your next appointment, please call your pharmacy*   Lab Work: NONE If you have labs (blood work) drawn today and your tests are completely normal, you will receive your results only by: MyChart Message (if you have MyChart) OR A paper copy in the mail If you have any lab test that is abnormal or we need to change your treatment, we will call you to review the results.  Testing/Procedures: [place] Sleep study Your physician has recommended that you have a sleep study. This test records several body functions during sleep, including: brain activity, eye movement, oxygen and carbon dioxide blood levels, heart rate and rhythm, breathing rate and rhythm, the flow of air through your mouth and nose, snoring, body muscle movements, and chest and belly movement.  Follow-Up: At [facility], you and your health needs are our priority.  As part of our continuing mission to provide you with exceptional heart care, we have created designated Provider Care Teams.  These Care Teams include your primary Cardiologist (physician) and Advanced Practice Providers (APPs -  Physician Assistants and Nurse Practitioners) who all work together to provide you with the care you need, when you need it.  Your next appointment:   6 month(s)  Provider:   Tonny Bollman, MD

## 2023-09-09 NOTE — Telephone Encounter (Signed)
Patient agreement reviewed and signed on 09/09/2023.  WatchPAT issued to patient on 09/09/2023 by Chrissie Noa, CMA Patient aware to not open the WatchPAT box until contacted with the activation PIN. Patient profile initialized in CloudPAT on 09/09/2023 by Arjun Bast, RN. Device serial number: [id number]  Please list Reason for Call as Advice Only and type "WatchPAT issued to patient" in the comment box.

## 2023-09-09 NOTE — Progress Notes (Signed)
Cardiology Office Note:    Date:  09/09/2023   ID:  Gracy Bruins, DOB 18-Feb-1946, MRN [id number]  PCP:  Ignatius Specking, MD   [facility] Providers Cardiologist:  Tonny Bollman, MD     Referring MD: Ignatius Specking, MD   Chief Complaint  Patient presents with   Atrial Fibrillation    History of Present Illness:    Henry Page is a 77 y.o. male with a hx of mitral valve disease, coronary artery disease, and atrial fibrillation. The patient underwent CABG, mitral valve repair, and maze procedure in 2014.  He developed recurrent atrial fibrillation in 2018 and underwent TEE guided cardioversion at that time.    He developed recurrent atrial fibrillation and was seen in the atrial fibrillation clinic last in 2019.  He declined antiarrhythmic drug therapy at that time.  He then saw Dr. Johney Frame in consultation in 2020 and again elected to continue with rate control and anticoagulation. He is here with his wife today.   He has been doing well from a symptomatic perspective.  He denies chest pain, chest pressure, shortness of breath, edema, orthopnea, PND, or heart palpitations.  He does complain of poor sleep, sometimes feeling poorly rested when he wakes up, and has daytime somnolence.  His home blood pressure readings are brought in today in the afternoon readings are excellent.  His morning readings prior to medicine administration are elevated sometimes with diastolic readings about 95 mmHg.  The patient does have postural lightheadedness without syncope.  He states after he stands for a minute his symptoms resolved.  He is only drinking about 2 bottles of water per day.  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   LIMA to LAD, Sequential SVG to  intermediate and OM1, SVG to RCA, EVH via right thigh   S/P Maze operation for atrial fibrillation 07/22/2013   Complete bilateral atrial lesion set using bipolar radiofrequency and cryothermy ablation with clipping of LA appendage    S/P mitral valve repair 07/22/2013   Complex valvuloplasty including triangular resection of posterior leaflet with artificial Gore-tex neocord placement x2 and 34mm Sorin Memo 3D ring annuloplasty   Sleep apnea     Past Surgical History:  Procedure Laterality Date   CARDIOVERSION N/A 02/08/2017   Procedure: CARDIOVERSION;  Surgeon: Lars Masson, MD;  Location: [facility] ENDOSCOPY;  Service: Cardiovascular;  Laterality: N/A;   CARDIOVERSION N/A 10/10/2018   Procedure: CARDIOVERSION;  Surgeon: Thurmon Fair, MD;  Location: [facility] ENDOSCOPY;  Service: Cardiovascular;  Laterality: N/A;   CATARACT EXTRACTION W/PHACO Left 07/26/2023   Procedure: CATARACT EXTRACTION PHACO AND INTRAOCULAR LENS PLACEMENT (IOC);  Surgeon: [name], MD;  Location: [facility] ORS;  Service: Ophthalmology;  Laterality: Left;  CDE- 6.63   CATARACT EXTRACTION W/PHACO Right 08/05/2023   Procedure: CATARACT EXTRACTION PHACO AND INTRAOCULAR LENS PLACEMENT (IOC);  Surgeon: [name], MD;  Location: [facility] ORS;  Service: Ophthalmology;  Laterality: Right;  CDE 10.35   CORONARY ARTERY BYPASS GRAFT N/A 07/22/2013   Procedure: CORONARY ARTERY BYPASS GRAFTING (CABG);  Surgeon: Purcell Nails, MD;  Location: [facility] OR;  Service: Open Heart Surgery;  Laterality: N/A;   CYSTOSCOPY     INTRAOPERATIVE TRANSESOPHAGEAL ECHOCARDIOGRAM N/A 07/22/2013  Procedure: INTRAOPERATIVE TRANSESOPHAGEAL ECHOCARDIOGRAM;  Surgeon: Purcell Nails, MD;  Location: [facility] OR;  Service: Open Heart Surgery;  Laterality: N/A;   LEFT AND RIGHT HEART CATHETERIZATION WITH CORONARY ANGIOGRAM  07/07/2013   Procedure: LEFT AND RIGHT HEART CATHETERIZATION WITH CORONARY ANGIOGRAM;  Surgeon: Vesta Mixer, MD;  Location: [facility] CATH LAB;  Service:  Cardiovascular;;   MAZE N/A 07/22/2013   Procedure: MAZE;  Surgeon: Purcell Nails, MD;  Location: [facility] OR;  Service: Open Heart Surgery;  Laterality: N/A;   MITRAL VALVE REPAIR N/A 07/22/2013   Procedure: MITRAL VALVE REPAIR (MVR);  Surgeon: Purcell Nails, MD;  Location: [facility] OR;  Service: Open Heart Surgery;  Laterality: N/A;   PATENT FORAMEN OVALE CLOSURE N/A 07/22/2013   Procedure: PATENT FORAMEN OVALE CLOSURE;  Surgeon: Purcell Nails, MD;  Location: [facility] OR;  Service: Open Heart Surgery;  Laterality: N/A;   TEE WITHOUT CARDIOVERSION N/A 07/08/2013   Procedure: TRANSESOPHAGEAL ECHOCARDIOGRAM (TEE);  Surgeon: Laurey Morale, MD;  Location: [facility] ENDOSCOPY;  Service: Cardiovascular;  Laterality: N/A;   TEE WITHOUT CARDIOVERSION N/A 02/08/2017   Procedure: TRANSESOPHAGEAL ECHOCARDIOGRAM (TEE);  Surgeon: Lars Masson, MD;  Location: [facility] ENDOSCOPY;  Service: Cardiovascular;  Laterality: N/A;   TONSILLECTOMY      Current Medications: Current Meds  Medication Sig   amoxicillin (AMOXIL) 500 MG capsule Take 1,000 mg by mouth as needed (for pt's allergies).   benazepril (LOTENSIN) 20 MG tablet Take 1 tablet (20 mg total) by mouth 2 (two) times daily.   carvedilol (COREG) 25 MG tablet Take 1 tablet (25 mg total) by mouth 2 (two) times daily.   cetirizine (ZYRTEC) 10 MG tablet Take 10 mg by mouth daily as needed for allergies.   Coenzyme Q10 (CO Q 10) 100 MG CAPS Take 1 capsule by mouth daily.   ELIQUIS 5 MG TABS tablet TAKE 1 TABLET BY MOUTH TWICE DAILY   fenofibrate (TRICOR) 48 MG tablet TAKE 1 TABLET BY MOUTH DAILY   magnesium oxide (MAG-OX) 400 (240 Mg) MG tablet Take 400 mg by mouth daily.   Multiple Vitamin (MULTIVITAMIN) tablet Take 1 tablet by mouth daily.   rosuvastatin (CRESTOR) 10 MG tablet TAKE 1 TABLET BY MOUTH DAILY   sildenafil (REVATIO) 20 MG tablet Take 1-5 tablets by mouth daily as needed     Allergies:   Shellfish allergy, Sulfa antibiotics, Meperidine and related, and Misc.  sulfonamide containing compounds   Social History   Socioeconomic History   Marital status: Married    Spouse name: Not on file   Number of children: Not on file   Years of education: Not on file   Highest education level: Not on file  Occupational History   Occupation: sales  Tobacco Use   Smoking status: Never   Smokeless tobacco: Never  Vaping Use   Vaping status: Never Used  Substance and Sexual Activity   Alcohol use: Yes    Alcohol/week: 2.0 standard drinks of alcohol    Types: 2 Standard drinks or equivalent per week   Drug use: No   Sexual activity: Yes  Other Topics Concern   Not on file  Social History Narrative   Drinks caffeine   Retired   Lives with wife   Social Determinants of Corporate investment banker Strain: Not on file  Food Insecurity: Not on file  Transportation Needs: Not on file  Physical Activity: Not on file  Stress: Not on file  Social Connections: Not on file  Family History: The patient's family history includes Heart attack in his father.  ROS:   Please see the history of present illness.    All other systems reviewed and are negative.  EKGs/Labs/Other Studies Reviewed:    The following studies were reviewed today: Echo 03/20/2022: 1. Left ventricular ejection fraction, by estimation, is 55 to 60%. The  left ventricle has normal function. The left ventricle has no regional  wall motion abnormalities. There is mild left ventricular hypertrophy.  Left ventricular diastolic parameters  are indeterminate.   2. Right ventricular systolic function is normal. The right ventricular  size is moderately enlarged. There is normal pulmonary artery systolic  pressure.   3. Left atrial size was moderately dilated.   4. Right atrial size was moderately dilated.   5. MV mean gradient 5 mmHg HR 68 bpm. MV peak gradient 15 mmhg. . Mild  mitral valve regurgitation. There is a Complex valvuloplasty including  triangular resection of posterior  leaflet with artificial Gore-tex neocord  placement x2 and 34mm Sorin Memo 3D  ring annuloplasty present in the mitral position. Procedure Date:  07/22/2013.   6. The aortic valve is normal in structure. Aortic valve regurgitation is  not visualized.   7. 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   8. The inferior vena cava is normal in size with greater than 50%  respiratory variability, suggesting right atrial pressure of 3 mmHg.   Comparison(s): No significant change from prior study.  EKG Interpretation Date/Time:  Monday September 09 2023 09:56:46 EDT Ventricular Rate:  73 PR Interval:    QRS Duration:  100 QT Interval:  392 QTC Calculation: 431 R Axis:   61  Text Interpretation: Atrial fibrillation When compared with ECG of 14-Jan-2023 17:31, No significant change was found Confirmed by Tonny Bollman [id number]) on 09/09/2023 11:43:10 AM   Carotid US 01/25/2023: IMPRESSION: Color duplex indicates minimal heterogeneous and calcified plaque, with no hemodynamically significant stenosis by duplex criteria in the extracranial cerebrovascular circulation.  Recent Labs: 01/14/2023: ALT 20; Hemoglobin 15.1; Platelets 201 03/29/2023: BUN 21; Creatinine, Ser 1.40; Potassium 4.4; Sodium 143  Recent Lipid Panel    Component Value Date/Time   CHOL 116 03/20/2022 1201   TRIG 152 (H) 03/20/2022 1201   HDL 43 03/20/2022 1201   CHOLHDL 2.7 03/20/2022 1201   CHOLHDL 3.4 06/05/2016 1048   VLDL 50 (H) 06/05/2016 1048   LDLCALC 47 03/20/2022 1201   LDLDIRECT 42.0 04/26/2015 0950     Risk Assessment/Calculations:    CHA2DS2-VASc Score = 6   This indicates a 9.7% annual risk of stroke. The patient's score is based upon: CHF History: 0 HTN History: 1 Diabetes History: 0 Stroke History: 2 Vascular Disease History: 1 Age Score: 2 Gender Score: 0       STOP-Bang Score:  6       Physical Exam:    VS:  Ht 5\' 10"  (1.778 m)   Wt 188 lb (85.3 kg)   BMI 26.98 kg/m     Wt  Readings from Last 3 Encounters:  09/09/23 188 lb (85.3 kg)  08/05/23 185 lb (83.9 kg)  07/30/23 185 lb (83.9 kg)     GEN:  Well nourished, well developed in no acute distress HEENT: Normal NECK: No JVD; No carotid bruits LYMPHATICS: No lymphadenopathy CARDIAC: RRR, no murmurs, rubs, gallops RESPIRATORY:  Clear to auscultation without rales, wheezing or rhonchi  ABDOMEN: Soft, non-tender, non-distended MUSCULOSKELETAL:  No edema; No deformity  SKIN: Warm and dry NEUROLOGIC:  Alert and oriented x 3 PSYCHIATRIC:  Normal affect   ASSESSMENT:    1. Coronary artery disease involving native coronary artery of native heart without angina pectoris   2. Daytime somnolence   3. S/P mitral valve repair   4. Persistent atrial fibrillation (HCC)   5. Essential hypertension, benign    PLAN:    In order of problems listed above:  The patient is stable with no anginal symptoms.  He will continue on an ACE inhibitor, beta-blocker, and statin drug.  No changes are made today. I have recommended a home sleep study.  He has elevated blood pressure readings in the mornings and reports sometimes feeling poorly rested in also with daytime somnolence.  We discussed the potential cardiovascular implications of untreated sleep apnea today and he agrees to proceed. Last echo reviewed with normal function of his mitral valve repair site. The patient's rhythm is more regular today but he remains in atrial fibrillation which is now longstanding.  He continues on apixaban for anticoagulation and carvedilol for heart rate control. Afternoon readings are excellent, morning readings before he takes his medications are above goal.  I asked him to reduce alcohol intake and limit scotch to 1/day, increase water intake, and continue on his current medical program with benazepril and carvedilol.  When he increased his benazepril dose he experienced symptomatic hypotension.  I am concerned that he will not tolerate more  aggressive blood pressure lowering at this time.  He also uses sildenafil on a regular basis and experiences lower blood pressure with this.  We discussed separating this out from administration of his blood pressure medicines by several hours to avoid medication related hypotension.           Medication Adjustments/Labs and Tests Ordered: Current medicines are reviewed at length with the patient today.  Concerns regarding medicines are outlined above.  Orders Placed This Encounter  Procedures   EKG 12-Lead   [place] Sleep Study   No orders of the defined types were placed in this encounter.   Patient Instructions  Medication Instructions:  Your physician recommends that you continue on your current medications as directed. Please refer to the Current Medication list given to you today.  *If you need a refill on your cardiac medications before your next appointment, please call your pharmacy*   Lab Work: NONE If you have labs (blood work) drawn today and your tests are completely normal, you will receive your results only by: MyChart Message (if you have MyChart) OR A paper copy in the mail If you have any lab test that is abnormal or we need to change your treatment, we will call you to review the results.  Testing/Procedures: [place] Sleep study Your physician has recommended that you have a sleep study. This test records several body functions during sleep, including: brain activity, eye movement, oxygen and carbon dioxide blood levels, heart rate and rhythm, breathing rate and rhythm, the flow of air through your mouth and nose, snoring, body muscle movements, and chest and belly movement.  Follow-Up: At [facility], you and your health needs are our priority.  As part of our continuing mission to provide you with exceptional heart care, we have created designated Provider Care Teams.  These Care Teams include your primary Cardiologist (physician) and Advanced Practice  Providers (APPs -  Physician Assistants and Nurse Practitioners) who all work together to provide you with the care you need, when you need it.  Your next appointment:  6 month(s)  Provider:   Tonny Bollman, MD        Signed, Tonny Bollman, MD  09/09/2023 11:50 AM    [facility]

## 2023-09-10 LAB — BASIC METABOLIC PANEL
BUN/Creatinine Ratio: 18 (ref 10–24)
BUN: 27 mg/dL (ref 8–27)
CO2: 25 mmol/L (ref 20–29)
Calcium: 9.8 mg/dL (ref 8.6–10.2)
Chloride: 103 mmol/L (ref 96–106)
Creatinine, Ser: 1.46 mg/dL — ABNORMAL HIGH (ref 0.76–1.27)
Glucose: 91 mg/dL (ref 70–99)
Potassium: 4.6 mmol/L (ref 3.5–5.2)
Sodium: 141 mmol/L (ref 134–144)
eGFR: 49 mL/min/{1.73_m2} — ABNORMAL LOW (ref >59)

## 2023-09-24 DIAGNOSIS — B029 Zoster without complications: Secondary | ICD-10-CM

## 2023-09-24 HISTORY — DX: Zoster without complications: B02.9

## 2023-09-26 DIAGNOSIS — H00024 Hordeolum internum left upper eyelid: Secondary | ICD-10-CM | POA: Diagnosis not present

## 2023-09-28 DIAGNOSIS — H05012 Cellulitis of left orbit: Secondary | ICD-10-CM | POA: Diagnosis not present

## 2023-09-28 DIAGNOSIS — J069 Acute upper respiratory infection, unspecified: Secondary | ICD-10-CM | POA: Diagnosis not present

## 2023-09-28 DIAGNOSIS — J019 Acute sinusitis, unspecified: Secondary | ICD-10-CM | POA: Diagnosis not present

## 2023-09-28 DIAGNOSIS — B349 Viral infection, unspecified: Secondary | ICD-10-CM | POA: Diagnosis not present

## 2023-09-28 DIAGNOSIS — R030 Elevated blood-pressure reading, without diagnosis of hypertension: Secondary | ICD-10-CM | POA: Diagnosis not present

## 2023-09-28 DIAGNOSIS — U071 COVID-19: Secondary | ICD-10-CM | POA: Diagnosis not present

## 2023-09-30 ENCOUNTER — Other Ambulatory Visit: Payer: Self-pay

## 2023-09-30 ENCOUNTER — Emergency Department (HOSPITAL_COMMUNITY)
Admission: EM | Admit: 2023-09-30 | Discharge: 2023-09-30 | Disposition: A | Discharge status: Home / Self Care | Payer: Medicare Other | Attending: Emergency Medicine | Admitting: Emergency Medicine

## 2023-09-30 DIAGNOSIS — Z951 Presence of aortocoronary bypass graft: Secondary | ICD-10-CM | POA: Insufficient documentation

## 2023-09-30 DIAGNOSIS — B029 Zoster without complications: Secondary | ICD-10-CM | POA: Diagnosis not present

## 2023-09-30 DIAGNOSIS — Z79899 Other long term (current) drug therapy: Secondary | ICD-10-CM | POA: Diagnosis not present

## 2023-09-30 DIAGNOSIS — I129 Hypertensive chronic kidney disease with stage 1 through stage 4 chronic kidney disease, or unspecified chronic kidney disease: Secondary | ICD-10-CM | POA: Diagnosis not present

## 2023-09-30 DIAGNOSIS — B0230 Zoster ocular disease, unspecified: Secondary | ICD-10-CM | POA: Diagnosis not present

## 2023-09-30 DIAGNOSIS — Z20822 Contact with and (suspected) exposure to covid-19: Secondary | ICD-10-CM | POA: Insufficient documentation

## 2023-09-30 DIAGNOSIS — E871 Hypo-osmolality and hyponatremia: Secondary | ICD-10-CM | POA: Diagnosis not present

## 2023-09-30 DIAGNOSIS — N183 Chronic kidney disease, stage 3 unspecified: Secondary | ICD-10-CM | POA: Diagnosis not present

## 2023-09-30 DIAGNOSIS — Z7901 Long term (current) use of anticoagulants: Secondary | ICD-10-CM | POA: Insufficient documentation

## 2023-09-30 DIAGNOSIS — R220 Localized swelling, mass and lump, head: Secondary | ICD-10-CM | POA: Diagnosis present

## 2023-09-30 DIAGNOSIS — B0239 Other herpes zoster eye disease: Secondary | ICD-10-CM | POA: Diagnosis not present

## 2023-09-30 LAB — COMPREHENSIVE METABOLIC PANEL
ALT: 32 U/L (ref 0–44)
AST: 40 U/L (ref 15–41)
Albumin: 3.9 g/dL (ref 3.5–5.0)
Alkaline Phosphatase: 68 U/L (ref 38–126)
Anion gap: 8 (ref 5–15)
BUN: 21 mg/dL (ref 8–23)
CO2: 27 mmol/L (ref 22–32)
Calcium: 8.9 mg/dL (ref 8.9–10.3)
Chloride: 95 mmol/L — ABNORMAL LOW (ref 98–111)
Creatinine, Ser: 1.45 mg/dL — ABNORMAL HIGH (ref 0.61–1.24)
GFR, Estimated: 50 mL/min — ABNORMAL LOW (ref >60)
Glucose, Bld: 124 mg/dL — ABNORMAL HIGH (ref 70–99)
Potassium: 3.8 mmol/L (ref 3.5–5.1)
Sodium: 130 mmol/L — ABNORMAL LOW (ref 135–145)
Total Bilirubin: 0.9 mg/dL (ref 0.3–1.2)
Total Protein: 7.6 g/dL (ref 6.5–8.1)

## 2023-09-30 LAB — CBC WITH DIFFERENTIAL/PLATELET
Abs Immature Granulocytes: 0.01 10*3/uL (ref 0.00–0.07)
Basophils Absolute: 0 10*3/uL (ref 0.0–0.1)
Basophils Relative: 0 %
Eosinophils Absolute: 0 10*3/uL (ref 0.0–0.5)
Eosinophils Relative: 1 %
HCT: 42.8 % (ref 39.0–52.0)
Hemoglobin: 14.4 g/dL (ref 13.0–17.0)
Immature Granulocytes: 0 %
Lymphocytes Relative: 32 %
Lymphs Abs: 2 10*3/uL (ref 0.7–4.0)
MCH: 32.2 pg (ref 26.0–34.0)
MCHC: 33.6 g/dL (ref 30.0–36.0)
MCV: 95.7 fL (ref 80.0–100.0)
Monocytes Absolute: 0.5 10*3/uL (ref 0.1–1.0)
Monocytes Relative: 7 %
Neutro Abs: 3.7 10*3/uL (ref 1.7–7.7)
Neutrophils Relative %: 60 %
Platelets: 180 10*3/uL (ref 150–400)
RBC: 4.47 MIL/uL (ref 4.22–5.81)
RDW: 13.6 % (ref 11.5–15.5)
WBC: 6.1 10*3/uL (ref 4.0–10.5)
nRBC: 0 % (ref 0.0–0.2)

## 2023-09-30 LAB — SARS CORONAVIRUS 2 BY RT PCR: SARS Coronavirus 2 by RT PCR: NEGATIVE

## 2023-09-30 MED ORDER — TETRACAINE HCL 0.5 % OP SOLN
1.0000 [drp] | Freq: Once | OPHTHALMIC | Status: AC
  Administered 2023-09-30: 1 [drp] via OPHTHALMIC
  Filled 2023-09-30: qty 4

## 2023-09-30 MED ORDER — FLUORESCEIN SODIUM 1 MG OP STRP
1.0000 | ORAL_STRIP | Freq: Once | OPHTHALMIC | Status: AC
  Administered 2023-09-30: 1 via OPHTHALMIC
  Filled 2023-09-30: qty 1

## 2023-09-30 MED ORDER — VALACYCLOVIR HCL 1 G PO TABS
1000.0000 mg | ORAL_TABLET | Freq: Three times a day (TID) | ORAL | 0 refills | Status: AC
Start: 2023-09-30 — End: 2023-10-14

## 2023-09-30 MED ORDER — VALACYCLOVIR HCL 500 MG PO TABS
1000.0000 mg | ORAL_TABLET | Freq: Once | ORAL | Status: AC
  Administered 2023-09-30: 1000 mg via ORAL
  Filled 2023-09-30: qty 2

## 2023-09-30 NOTE — ED Provider Notes (Signed)
[place] EMERGENCY DEPARTMENT AT [facility] Provider Note   CSN: [id number] Arrival date & time: 09/30/23  5284     History  Chief Complaint  Patient presents with   Facial Swelling    Henry Page is a 77 y.o. male with PMH as listed below who presents with swelling for last three days. Went to see his eye doctor, who gave him tobramycin eye drops. Also saw urgent care because of worsening symptoms, who gave him steroid eye drops and cephalexin for c/f cellulitis. Has forehead/eye pain, denies itching, or burning. Denies pain with movement of the eye. Denies foreign body sensation on the eye. States he does not see very well out of left eye at baseline d/t cataracts and prior ocular surgeries but that it is at its baseline, not any worse. Patient has a history of chicken pox but did not get his shingles vaccination.  Patient stated "I believe I may have an upper respiratory infection." Has had fatigue, Denies cough, SOB. Endorses chills at home.    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 Authorizing Provider  amoxicillin (AMOXIL) 500 MG capsule Take 1,000 mg by  mouth as needed (for pt's allergies). 01/18/19  Yes [provider]  benazepril (LOTENSIN) 20 MG tablet Take 1 tablet (20 mg total) by mouth 2 (two) times daily. 08/14/23  Yes Tonny Bollman, MD  carvedilol (COREG) 25 MG tablet Take 1 tablet (25 mg total) by mouth 2 (two) times daily. 07/05/23  Yes Tonny Bollman, MD  cephALEXin (KEFLEX) 500 MG capsule Take 500 mg by mouth 2 (two) times daily. 09/26/23  Yes [provider]  cetirizine (ZYRTEC) 10 MG tablet Take 10 mg by mouth daily as needed for allergies.   Yes [provider]  Coenzyme Q10 (CO Q 10) 100 MG CAPS Take 1 capsule by mouth daily.   Yes [provider]  ELIQUIS 5 MG TABS tablet TAKE 1 TABLET BY MOUTH TWICE DAILY 01/07/23  Yes Tonny Bollman, MD  fenofibrate (TRICOR) 48 MG tablet TAKE 1 TABLET BY MOUTH DAILY Patient taking differently: Take 48 mg by mouth daily. 04/26/23  Yes Tonny Bollman, MD  magnesium oxide (MAG-OX) 400 (240 Mg) MG tablet Take 400 mg by mouth daily.   Yes [provider]  Multiple Vitamin (MULTIVITAMIN) tablet Take 1 tablet by mouth daily.   Yes [provider]  rosuvastatin (CRESTOR) 10 MG tablet TAKE 1 TABLET BY MOUTH DAILY Patient taking differently: Take 10 mg by mouth every evening. 07/24/23  Yes Tonny Bollman, MD  sildenafil (REVATIO) 20 MG tablet Take 1-5 tablets by mouth daily as needed  Patient taking differently: Take 20 mg by mouth daily as needed (ED). 08/06/22  Yes Tonny Bollman, MD  tobramycin-dexamethasone [facility]) ophthalmic solution Place 1 drop into the left eye every 2 (two) hours. 09/28/23  Yes [provider]  valACYclovir (VALTREX) 1000 MG tablet Take 1 tablet (1,000 mg total) by mouth 3 (three) times daily for 14 days. 09/30/23 10/14/23 Yes Loetta Rough, MD      Allergies    Shellfish allergy, Sulfa antibiotics, Meperidine and related, and Misc. sulfonamide containing compounds    Review of Systems   Review of Systems A 10  point review of systems was performed and is negative unless otherwise reported in HPI.  Physical Exam Updated Vital Signs BP (!) 142/95   Pulse 63   Temp 97.9 F (36.6 C) (Oral)   Resp 19   Ht 5\' 10"  (1.778 m)   Wt 83.9 kg   SpO2 96%   BMI 26.54 kg/m  Physical Exam General: Normal appearing male, lying in bed.  HEENT: Sclera anicteric, MMM, trachea midline. Crusting vesicular/scabbed rash in V2 distrubution on L forehead/eyelid. L upper eyelid edema. No diffuse periorbital edema/induration. L eye is mildly injected. PERRLA, EOMI, no pain with EOM. Neg fluorescein stain exam. No areas of induration/fluctuance/purulent drainage on forehead. Cardiology: RRR, no murmurs/rubs/gallops.  Resp: Normal respiratory rate and effort. CTAB, no wheezes, rhonchi, crackles.  Abd: Soft, non-tender, non-distended. No rebound tenderness or guarding.  GU: Deferred. MSK: No peripheral edema or signs of trauma.  Skin: warm, dry.  Neuro: A&Ox4, CNs II-XII grossly intact. MAEs. Sensation grossly intact.  Psych: Normal mood and affect.   ED Results / Procedures / Treatments   Labs (all labs ordered are listed, but only abnormal results are displayed) Labs Reviewed  COMPREHENSIVE METABOLIC PANEL - Abnormal; Notable for the following components:      Result Value   Sodium 130 (*)    Chloride 95 (*)    Glucose, Bld 124 (*)    Creatinine, Ser 1.45 (*)    GFR, Estimated 50 (*)    All other components within normal limits  SARS CORONAVIRUS 2 BY RT PCR  CBC WITH DIFFERENTIAL/PLATELET    EKG None  Radiology No results found.  Procedures Procedures    Medications Ordered in ED Medications  fluorescein ophthalmic strip 1 strip (1 strip Left Eye Given by Other 09/30/23 1017)  tetracaine (PONTOCAINE) 0.5 % ophthalmic solution 1 drop (1 drop Left Eye Given by Other 09/30/23 1017)  valACYclovir (VALTREX) tablet 1,000 mg (1,000 mg Oral Given 09/30/23 1015)    ED Course/ Medical Decision Making/  A&P                          Medical Decision Making Amount and/or Complexity of Data Reviewed Labs: ordered. Decision-making details documented in ED Course.  Risk Prescription drug management.    This patient presents to the ED for concern of facial rash/malaise, this involves an extensive number of treatment options, and is a complaint that carries with it a high risk of complications and morbidity.  I considered the following differential and admission for this acute, potentially life threatening condition.   MDM:    Patient's exam c/w V1 distribution of zoster, AKA herpes ophthalmicus - rash began <1 week ago, will treat w/ valacyclovir 1g q8h x 14 days. Swelling limited to upper eyelid, no cohesive periorbital edema or induration, lower c/f periorbital or orbital cellulitis. Previous UC provider had given cephalexin  for possible facial cellulitis; I do not see evidence of this now but will continue patient on cephalexin and have him finish course. He already saw his eye doctor and was given tobramycin drops which I will have him continue, and today's fluroescein exam doesn't demonstrate corneal abrasion/ulcer, and he states his vision is at its baseline. He has malaise and sensation viral syndrome, likely d/t the shingles, and his covid is negative, labs are unremarkable.   Clinical Course as of 09/30/23 1554  Mon Sep 30, 2023  0952 WBC: 6.1 No leukocytosis  [HN]  1014 SARS Coronavirus 2 by RT PCR: NEGATIVE neg [HN]  1014 Sodium(!): 130 Mild hyponatremia, likely d/t decreased PO intake [HN]  1146 Has appt with ophthalmology later today, will call them and let him know he was diagnosed w/ shingles. Given dose of valacyclovir here. Instructed to stay well hydrated, take tylenol for pain control at home.  DC w/ discharge instructions/return precautions. All questions answered to patient's satisfaction.   [HN]    Clinical Course User Index [HN] Loetta Rough, MD    Labs: I  Ordered, and personally interpreted labs.  The pertinent results include:  those listed above  Additional history obtained from chart review, wife at bedside.    Cardiac Monitoring: The patient was maintained on a cardiac monitor.  I personally viewed and interpreted the cardiac monitored which showed an underlying rhythm of: NSR  Social Determinants of Health: Lives independently  Disposition:  DC w/ discharge instructions/return precautions. All questions answered to patient's satisfaction.    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   LIMA to LAD, Sequential SVG to intermediate and OM1, SVG to RCA, EVH via right thigh   S/P Maze operation for atrial fibrillation 07/22/2013   Complete bilateral atrial lesion set using bipolar radiofrequency and cryothermy ablation with clipping of LA appendage    S/P mitral valve repair 07/22/2013   Complex valvuloplasty including triangular resection of posterior leaflet with artificial Gore-tex neocord placement x2 and 34mm Sorin Memo 3D ring annuloplasty   Sleep apnea      Medicines Meds ordered this encounter  Medications   fluorescein ophthalmic strip 1 strip   tetracaine (PONTOCAINE) 0.5 % ophthalmic solution 1 drop   valACYclovir (VALTREX) tablet 1,000 mg   valACYclovir (VALTREX) 1000 MG tablet    Sig: Take 1 tablet (1,000 mg total) by mouth 3 (three) times daily for 14 days.    Dispense:  42 tablet    Refill:  0    I have reviewed the patients home medicines and have made adjustments as needed  Problem List / ED Course: Problem List Items Addressed This Visit   None Visit Diagnoses     Herpes zoster ophthalmicus    -  Primary   Relevant  Medications   valACYclovir (VALTREX) tablet 1,000 mg (Completed)   cephALEXin (KEFLEX) 500 MG capsule   valACYclovir (VALTREX) 1000 MG tablet                   This note was created using dictation software, which may contain spelling or grammatical errors.    Loetta Rough, MD 09/30/23 [phone]

## 2023-09-30 NOTE — ED Triage Notes (Addendum)
Pt come in by POV , Patient has swelling for last three days, seen eye doctor. Also sen by urgent care gave given script for tobramycin and cephalexin. Has pain, denise stinging, itching, or burning. Pt Hx of chicken pox.  Patient stated "I believe I may have an Upper respiratory infection." Denises trouble breathing.

## 2023-09-30 NOTE — Discharge Instructions (Addendum)
Thank you for coming to [facility] Emergency Department. You were seen for left upper facial rash/pain. We did an exam, labs, and imaging, and these showed herpes zoster ophthalmicus, or shingles of the left forehead/eye. Please take valacyclovir 1g every 8 hours for 2 weeks. Please continue all other medications that have been prescribed to you, including the eye drops. Please follow up with your eye doctor today.  Please follow up with your primary care provider within 1 week.   Do not hesitate to return to the ED or call 911 if you experience: -Worsening symptoms -Worsening vision of left eye -Severe pain of left eye -Lightheadedness, passing out -Fevers/chills -Anything else that concerns you

## 2023-10-17 DIAGNOSIS — H16223 Keratoconjunctivitis sicca, not specified as Sjogren's, bilateral: Secondary | ICD-10-CM | POA: Diagnosis not present

## 2023-11-07 DIAGNOSIS — B0239 Other herpes zoster eye disease: Secondary | ICD-10-CM | POA: Diagnosis not present

## 2023-11-07 DIAGNOSIS — H02412 Mechanical ptosis of left eyelid: Secondary | ICD-10-CM | POA: Diagnosis not present

## 2023-11-07 DIAGNOSIS — Z961 Presence of intraocular lens: Secondary | ICD-10-CM | POA: Diagnosis not present

## 2023-11-19 ENCOUNTER — Other Ambulatory Visit: Payer: Self-pay | Admitting: Cardiovascular Disease

## 2023-11-19 ENCOUNTER — Ambulatory Visit: Payer: Medicare Other | Admitting: Internal Medicine

## 2023-11-19 DIAGNOSIS — I480 Paroxysmal atrial fibrillation: Secondary | ICD-10-CM

## 2023-11-19 NOTE — Telephone Encounter (Signed)
Prescription refill request for Eliquis received. Indication: PAF Last office visit: 09/09/23  Dayle Points MD Scr: 1.45 on 09/30/23  Epic Age: 77 Weight: 85.3kg  Based on above findings Eliquis 5mg  twice daily is the appropriate dose.  Refill approved.

## 2023-11-25 ENCOUNTER — Telehealth: Payer: Self-pay

## 2023-11-25 NOTE — Telephone Encounter (Signed)
Tried to call patient and they would like a call from Nurse Maralyn Sago

## 2023-11-25 NOTE — Telephone Encounter (Signed)
Patient's wife is calling requesting Maralyn Sago call the patient back on his cell regarding this. The number is [phone].

## 2023-11-25 NOTE — Telephone Encounter (Signed)
**Note De-Identified Ashish Rossetti Obfuscation** Ordering provider: Dr Excell Seltzer Associated diagnoses: Somnolence-R40.0 and Fatigue-R53.83 WatchPAT PA obtained on 11/25/2023 by Marshia Tropea, Lorelle Formosa, LPN. Authorization: Per [facility] Provider Portal: CPT Code: 16109 Sleep study, unattended, simultaneous recording; heart rate, oxygen saturation, respiratory analysis (eg, by airflow or peripheral arterial tone), and sleep time Inquiry summary Notification/Prior Authorization not required for this service.  Patient notified of PIN ([id number]) on 11/25/2023 Harol Shabazz Notification Method: phone. The pt was not available so I provided the pts wife, Bjorn Loser with the WatchPAT One-HST Pin # of "[id number]".  While on the call, Bjorn Loser stated that the pt has had shingles since early October and that they have noticed that his BP drops in the afternoons and that sometiimes he does not take his BP pilss due to his low BP. She is requesting a call back from Dr Earmon Phoenix nurse as she is concerned about his low BP readings and prior to him doing this sleep study.  Phone note routed to covering staff for follow-up.

## 2023-11-26 NOTE — Telephone Encounter (Signed)
Pt c/o BP issue: STAT if pt c/o blurred vision, one-sided weakness or slurred speech  1. What are your last 5 BP readings?   Not available  2. Are you having any other symptoms (ex. Dizziness, headache, blurred vision, passed out)?  Dizziness,  3. What is your BP issue?    Patient/wife called and wants a call back RN Maralyn Sago.  Wife stated patient has had a bout with shingles above his left eye and his BP has since been trending low.

## 2023-11-28 NOTE — Telephone Encounter (Signed)
Returned call to patient who states he's still currently battling with shingles. He is down 12 pounds overall; states he's not eating as he used to and has cut back on drinking scotch. His main complaint is feeling lightheaded during the day. He says he's not really dizzy, just a feeling of lightheadedness that occurs daily. He had shingles on L-side of face, eye, and hair line. He took regimen of Valtrex. Rash is gone, but states face is "completely numb, no feeling at all." Advised that this could be nerve damage or temporary facial paralysis.He c/o lingering fatigue stating he can sit down and fall asleep about at any time of day. Wife in background reports he's been doing a lot of sleeping in his chair "all day." Advised pt this sleeping/decreased activity, coupled with weight loss, could account for his decreased BP, especially since intake throughout day hasn't been entirely adequate. Reports BP upon waking in around 135 systolic, an hour later will be about 140-142, but dips in the afternoon and evening to about 100 systolic. He's spoken with his pharmacist and has been cutting doses of his benazepril and Coreg. Overall, feels HR since changing to Coreg from Metoprolol has been running higher and this makes him uneasy. Advised tomorrow he try skipping the benazepril entirely and only do Coreg in the morning and evening which will help his HR, but to monitor BP as it's likely to go up. He understands, will keep an eye on things and call us back if needed. Advised he can do the Itamar as his excessive tiredness he's feeling from what appears to be the shingles virus, will not cause apneic episodes which is what the test is assessing. He plans to wait one week, then proceed.

## 2023-12-08 ENCOUNTER — Telehealth: Payer: Self-pay | Admitting: Cardiothoracic Surgery

## 2023-12-08 NOTE — Telephone Encounter (Signed)
Received call from patient tonight for labile BP. He reports BP especially labile given recent shingles.  AM BP was 140/78, took his benazepril and carvedilol at that time. Tonight BP was 80/60, 88/60 on repeat.  Without intervention, it came up to 100/68 and then 105/76 while he was on the phone with me  Not lightheaded just feels generally tired. Pulse stays in 70s.  Advised to hold off on meds this evening. Tomorrow AM, advised cutting his benazepril in half from 20 to 10mg , stay with carvedilol dose as is.  He expressed understanding and will follow-up with his regular physician in the morning

## 2023-12-23 ENCOUNTER — Telehealth: Payer: Self-pay | Admitting: Cardiovascular Disease

## 2023-12-23 NOTE — Telephone Encounter (Signed)
Pt c/o BP issue: STAT if pt c/o blurred vision, one-sided weakness or slurred speech  1. What are your last 5 BP readings?  135/75 - in morning  99/62 - after medication in the afternoon   2. Are you having any other symptoms (ex. Dizziness, headache, blurred vision, passed out)? A little dizziness   3. What is your BP issue? Patient states that he is having issues with BP dropping too low. States he has been cutting meds in half to help and it still seems to be going lower. Requesting call back to discuss further.

## 2023-12-24 NOTE — Telephone Encounter (Signed)
 Pt seen in clinic 09/09/23 by Wonda: Afternoon readings are excellent, morning readings before he takes his medications are above goal.  I asked him to reduce alcohol intake and limit scotch to 1/day, increase water  intake, and continue on his current medical program with benazepril  and carvedilol .  When he increased his benazepril  dose he experienced symptomatic hypotension.  I am concerned that he will not tolerate more aggressive blood pressure lowering at this time.  He also uses sildenafil  on a regular basis and experiences lower blood pressure with this.  We discussed separating this out from administration of his blood pressure medicines by several hours to avoid medication related hypotension.  Called and spoke with patient who states he is cutting his benazepril  in half and taking 10mg  daily and is also cutting his Carvedilol  25mg  in half and sometimes skipping evening dose due to decreased pressure. Reports this morning's BP was 135/70. AM readings usually are no higher than mid 140's. He is concerned because BP is dropping to low 100's to teens in the afternoons/evenings. States HR is doing good, stays about 60-80. Advised him that these numbers are good, reassured him that he's getting enough perfusion, he does admit that dizziness has improved. He rarely uses sildenafil  and if he does, he monitors pressure and skips BP meds if needed. Suggested that he try cutting out the half tablet of benazepril  and continuing on only carvedilol  at the half tablet he's been doing and track his pressures then call back with a log. Since BP is staying no higher than 145 and coreg  will drop it according to him, roughly 8 points, he would remain at stage 1 htn by only using that and hopefully not have the drops at all. He agrees and will continue to monitor.

## 2024-01-01 ENCOUNTER — Encounter (HOSPITAL_COMMUNITY): Payer: Self-pay

## 2024-01-01 ENCOUNTER — Emergency Department (HOSPITAL_COMMUNITY)
Admission: EM | Admit: 2024-01-01 | Discharge: 2024-01-01 | Disposition: A | Discharge status: Home / Self Care | Payer: Medicare Other | Attending: Emergency Medicine | Admitting: Emergency Medicine

## 2024-01-01 ENCOUNTER — Emergency Department (HOSPITAL_COMMUNITY): Payer: Medicare Other

## 2024-01-01 ENCOUNTER — Other Ambulatory Visit: Payer: Self-pay

## 2024-01-01 DIAGNOSIS — N183 Chronic kidney disease, stage 3 unspecified: Secondary | ICD-10-CM | POA: Diagnosis not present

## 2024-01-01 DIAGNOSIS — Z79899 Other long term (current) drug therapy: Secondary | ICD-10-CM | POA: Insufficient documentation

## 2024-01-01 DIAGNOSIS — I129 Hypertensive chronic kidney disease with stage 1 through stage 4 chronic kidney disease, or unspecified chronic kidney disease: Secondary | ICD-10-CM | POA: Diagnosis not present

## 2024-01-01 DIAGNOSIS — G454 Transient global amnesia: Secondary | ICD-10-CM | POA: Insufficient documentation

## 2024-01-01 DIAGNOSIS — Z951 Presence of aortocoronary bypass graft: Secondary | ICD-10-CM | POA: Insufficient documentation

## 2024-01-01 DIAGNOSIS — R413 Other amnesia: Secondary | ICD-10-CM | POA: Diagnosis present

## 2024-01-01 DIAGNOSIS — Z7901 Long term (current) use of anticoagulants: Secondary | ICD-10-CM | POA: Insufficient documentation

## 2024-01-01 DIAGNOSIS — E236 Other disorders of pituitary gland: Secondary | ICD-10-CM | POA: Diagnosis not present

## 2024-01-01 HISTORY — DX: Transient global amnesia: G45.4

## 2024-01-01 LAB — BASIC METABOLIC PANEL
Anion gap: 6 (ref 5–15)
BUN: 23 mg/dL (ref 8–23)
CO2: 24 mmol/L (ref 22–32)
Calcium: 9.2 mg/dL (ref 8.9–10.3)
Chloride: 105 mmol/L (ref 98–111)
Creatinine, Ser: 1.27 mg/dL — ABNORMAL HIGH (ref 0.61–1.24)
GFR, Estimated: 58 mL/min — ABNORMAL LOW (ref >60)
Glucose, Bld: 109 mg/dL — ABNORMAL HIGH (ref 70–99)
Potassium: 4.8 mmol/L (ref 3.5–5.1)
Sodium: 135 mmol/L (ref 135–145)

## 2024-01-01 LAB — CBC WITH DIFFERENTIAL/PLATELET
Abs Immature Granulocytes: 0.03 10*3/uL (ref 0.00–0.07)
Basophils Absolute: 0 10*3/uL (ref 0.0–0.1)
Basophils Relative: 0 %
Eosinophils Absolute: 0.1 10*3/uL (ref 0.0–0.5)
Eosinophils Relative: 1 %
HCT: 39.4 % (ref 39.0–52.0)
Hemoglobin: 12.9 g/dL — ABNORMAL LOW (ref 13.0–17.0)
Immature Granulocytes: 0 %
Lymphocytes Relative: 16 %
Lymphs Abs: 1.3 10*3/uL (ref 0.7–4.0)
MCH: 32.1 pg (ref 26.0–34.0)
MCHC: 32.7 g/dL (ref 30.0–36.0)
MCV: 98 fL (ref 80.0–100.0)
Monocytes Absolute: 0.5 10*3/uL (ref 0.1–1.0)
Monocytes Relative: 6 %
Neutro Abs: 6.3 10*3/uL (ref 1.7–7.7)
Neutrophils Relative %: 77 %
Platelets: 232 10*3/uL (ref 150–400)
RBC: 4.02 MIL/uL — ABNORMAL LOW (ref 4.22–5.81)
RDW: 13.7 % (ref 11.5–15.5)
WBC: 8.2 10*3/uL (ref 4.0–10.5)
nRBC: 0 % (ref 0.0–0.2)

## 2024-01-01 LAB — CBG MONITORING, ED: Glucose-Capillary: 116 mg/dL — ABNORMAL HIGH (ref 70–99)

## 2024-01-01 NOTE — ED Provider Notes (Signed)
 [place] EMERGENCY DEPARTMENT AT [facility] Provider Note  CSN: [id number] Arrival date & time: 01/01/24 1524  Chief Complaint(s) Hypertension  HPI Henry Page is a 78 y.o. male here today for high blood pressure and amnesia.  Patient returned from dropping the dog out at the groomer, cannot remember the last 1 hour.  He says he did a similar episode 1 year ago when he had COVID.  Patient currently getting over shingles.  Wife noted that the patient's blood pressure was high.  He takes carvedilol  and Benzapril.   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   Complex valvuloplasty including triangular resection of posterior leaflet with artificial Gore-tex neocord placement x2 and 34mm Sorin Memo 3D ring annuloplasty   Shingles 09/2023   Sleep apnea    TGA (transient global amnesia)    Patient Active Problem List   Diagnosis Date Noted   Other persistent atrial fibrillation (HCC)    Hyperlipidemia 05/08/2017   S/P mitral valve repair 11/02/2013   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 Authorizing Provider  amoxicillin  (AMOXIL ) 500 MG capsule Take 1,000 mg by mouth as needed (for pt's allergies). 01/18/19   [provider]  apixaban  (ELIQUIS ) 5 MG TABS tablet TAKE 1 TABLET BY MOUTH TWICE DAILY 11/19/23   Wonda Sharper, MD  benazepril  (LOTENSIN ) 20 MG tablet Take 1 tablet (20 mg total) by mouth 2 (two) times daily. 08/14/23   Wonda Sharper, MD  carvedilol  (COREG ) 25 MG tablet Take 1 tablet (25 mg total) by mouth 2 (two) times daily. 07/05/23   Wonda Sharper, MD  cephALEXin (KEFLEX) 500 MG capsule Take 500 mg by mouth 2 (two) times daily. 09/26/23   [provider]  cetirizine (ZYRTEC) 10 MG tablet Take 10 mg by mouth daily as needed for allergies.    [provider]  Coenzyme Q10 (CO Q 10) 100 MG CAPS Take 1 capsule by mouth daily.    [provider]  fenofibrate  (TRICOR ) 48 MG tablet TAKE 1 TABLET BY MOUTH DAILY Patient taking differently: Take 48 mg by mouth daily. 04/26/23   Wonda Sharper, MD  magnesium  oxide (MAG-OX) 400 (240 Mg) MG tablet Take 400 mg by mouth daily.    [provider]  Multiple Vitamin (MULTIVITAMIN) tablet Take 1 tablet by mouth daily.    [provider]  rosuvastatin  (CRESTOR ) 10 MG tablet TAKE 1 TABLET BY MOUTH DAILY Patient taking differently: Take 10 mg by mouth every evening. 07/24/23   Wonda Sharper, MD  sildenafil  (REVATIO ) 20 MG tablet Take 1-5 tablets by mouth daily as needed Patient taking differently: Take 20 mg by mouth daily as needed (ED). 08/06/22   Wonda Sharper, MD  tobramycin-dexamethasone  [facility]) ophthalmic solution Place 1 drop into the left eye every 2 (two) hours. 09/28/23   [provider]                                                                                                                                    Past Surgical History Past Surgical History:  Procedure Laterality Date    CARDIOVERSION N/A 02/08/2017   Procedure: CARDIOVERSION;  Surgeon: Leim VEAR Moose, MD;  Location: [facility] ENDOSCOPY;  Service: Cardiovascular;  Laterality: N/A;   CARDIOVERSION N/A 10/10/2018   Procedure: CARDIOVERSION;  Surgeon: Francyne Headland, MD;  Location: [facility] ENDOSCOPY;  Service: Cardiovascular;  Laterality: N/A;   CATARACT EXTRACTION W/PHACO Left 07/26/2023   Procedure: CATARACT EXTRACTION PHACO AND INTRAOCULAR LENS PLACEMENT (IOC);  Surgeon: Harrie Agent, MD;  Location: [facility] ORS;  Service: Ophthalmology;  Laterality: Left;  CDE- 6.63   CATARACT EXTRACTION W/PHACO Right 08/05/2023   Procedure: CATARACT EXTRACTION PHACO AND INTRAOCULAR LENS PLACEMENT (IOC);  Surgeon: Harrie Agent, MD;  Location: [facility] ORS;  Service: Ophthalmology;  Laterality: Right;  CDE 10.35   CORONARY ARTERY BYPASS GRAFT N/A 07/22/2013   Procedure: CORONARY ARTERY BYPASS GRAFTING (CABG);  Surgeon: Sudie VEAR Laine, MD;  Location: [facility] OR;  Service: Open Heart Surgery;  Laterality: N/A;   CYSTOSCOPY     INTRAOPERATIVE TRANSESOPHAGEAL ECHOCARDIOGRAM N/A 07/22/2013   Procedure: INTRAOPERATIVE TRANSESOPHAGEAL ECHOCARDIOGRAM;  Surgeon: Sudie VEAR Laine, MD;  Location: [facility] OR;  Service: Open Heart Surgery;  Laterality: N/A;   LEFT AND RIGHT HEART CATHETERIZATION WITH CORONARY ANGIOGRAM  07/07/2013   Procedure: LEFT AND RIGHT HEART CATHETERIZATION WITH CORONARY ANGIOGRAM;  Surgeon: Aleene JINNY Passe, MD;  Location: [facility] CATH LAB;  Service: Cardiovascular;;   MAZE N/A 07/22/2013   Procedure: MAZE;  Surgeon: Sudie VEAR Laine, MD;  Location: [facility] OR;  Service: Open Heart Surgery;  Laterality: N/A;   MITRAL VALVE REPAIR N/A 07/22/2013   Procedure: MITRAL VALVE REPAIR (MVR);  Surgeon: Sudie VEAR Laine, MD;  Location: [facility] OR;  Service: Open Heart Surgery;  Laterality: N/A;   PATENT FORAMEN OVALE CLOSURE N/A 07/22/2013   Procedure: PATENT FORAMEN OVALE CLOSURE;  Surgeon: Sudie VEAR Laine, MD;  Location: [facility] OR;  Service: Open Heart Surgery;  Laterality: N/A;    TEE WITHOUT CARDIOVERSION N/A 07/08/2013   Procedure: TRANSESOPHAGEAL ECHOCARDIOGRAM (TEE);  Surgeon: Ezra GORMAN Shuck, MD;  Location: [facility] ENDOSCOPY;  Service: Cardiovascular;  Laterality: N/A;   TEE WITHOUT CARDIOVERSION N/A 02/08/2017   Procedure: TRANSESOPHAGEAL ECHOCARDIOGRAM (TEE);  Surgeon: Leim VEAR Moose, MD;  Location: [facility] ENDOSCOPY;  Service: Cardiovascular;  Laterality: N/A;   TONSILLECTOMY     Family History Family History  Problem Relation Age of Onset   Heart attack Father  Social History Social History   Tobacco Use   Smoking status: Never   Smokeless tobacco: Never  Vaping Use   Vaping status: Never Used  Substance Use Topics   Alcohol use: Yes    Alcohol/week: 2.0 standard drinks of alcohol    Types: 2 Standard drinks or equivalent per week   Drug use: No   Allergies Shellfish allergy, Sulfa antibiotics, Meperidine and related, and Misc. sulfonamide containing compounds  Review of Systems Review of Systems  Physical Exam Vital Signs  I have reviewed the triage vital signs BP (!) 162/89   Pulse 84   Temp 98.5 F (36.9 C) (Oral)   Resp 20   Ht 5' 10 (1.778 m)   Wt 81.6 kg   SpO2 99%   BMI 25.83 kg/m   Physical Exam Vitals reviewed.  Eyes:     Pupils: Pupils are equal, round, and reactive to light.  Cardiovascular:     Rate and Rhythm: Normal rate.     Heart sounds: Normal heart sounds.  Pulmonary:     Effort: Pulmonary effort is normal.  Abdominal:     General: Abdomen is flat.     Palpations: Abdomen is soft.  Musculoskeletal:     Cervical back: Normal range of motion.  Skin:    General: Skin is warm.  Neurological:     General: No focal deficit present.     Mental Status: He is alert.     Cranial Nerves: No cranial nerve deficit.     Sensory: No sensory deficit.     Motor: No weakness.     Gait: Gait normal.     ED Results and Treatments Labs (all labs ordered are listed, but only abnormal results are displayed) Labs  Reviewed  BASIC METABOLIC PANEL - Abnormal; Notable for the following components:      Result Value   Glucose, Bld 109 (*)    Creatinine, Ser 1.27 (*)    GFR, Estimated 58 (*)    All other components within normal limits  CBC WITH DIFFERENTIAL/PLATELET - Abnormal; Notable for the following components:   RBC 4.02 (*)    Hemoglobin 12.9 (*)    All other components within normal limits  CBG MONITORING, ED - Abnormal; Notable for the following components:   Glucose-Capillary 116 (*)    All other components within normal limits                                                                                                                          Radiology CT Head Wo Contrast Result Date: 01/01/2024 CLINICAL DATA:  Memory issues, hypertension EXAM: CT HEAD WITHOUT CONTRAST TECHNIQUE: Contiguous axial images were obtained from the base of the skull through the vertex without intravenous contrast. RADIATION DOSE REDUCTION: This exam was performed according to the departmental dose-optimization program which includes automated exposure control, adjustment of the mA and/or kV according to patient size and/or use of iterative reconstruction technique. COMPARISON:  No  prior CT head available 01/14/2023 MRI head FINDINGS: Brain: No evidence of acute infarction, hemorrhage, mass, mass effect, or midline shift. No hydrocephalus or extra-axial fluid collection. Periventricular white matter changes, likely the sequela of chronic small vessel ischemic disease. 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 Normal craniocervical junction. Vascular: No hyperdense vessel. Skull: Negative for fracture or focal lesion. Sinuses/Orbits: Opacification of the imaged portion of the right maxillary sinus. Mild mucosal thickening in the ethmoid air cells. Status post bilateral lens replacements. No acute finding in  the orbits. Other: The mastoid air cells are well aerated. IMPRESSION: 1. No acute intracranial process. 2. 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 If further evaluation of the sella is clinically indicated at this time, consider MRI brain with and without contrast with pituitary protocol. Electronically Signed   By: Donald Campion M.D.   On: 01/01/2024 17:52    Pertinent labs & imaging results that were available during my care of the patient were reviewed by me and considered in my medical decision making (see MDM for details).  Medications Ordered in ED Medications - No data to display                                                                                                                                   Procedures Procedures  (including critical care time)  Medical Decision Making / ED Course   This patient presents to the ED for concern of hypertension and amnesia, this involves an extensive number of treatment options, and is a complaint that carries with it a high risk of complications and morbidity.  The differential diagnosis includes ICH, hypertension, transient global amnesia, less likely meningitis, less likely encephalitis.  MDM: On exam, patient looks well.  His blood pressure now is 160/90.  With his history of having a previous episode, believe this is transient global amnesia, likely precipitated by a viral illness as which happened 1 year ago.  Will check basic labs and the patient, CT imaging of the head.  No neurological deficits.  Reassessment 6:30 PM-my independent review the patient's head CT shows no intracranial hemorrhage.  Unchanged from prior.  Reviewed the patient's labs, no anemia, no leukocytosis, kidney function at baseline.  Discussed patient's findings with him at bedside.  He will follow-up with his PCP.   Additional history  obtained: -Additional history obtained from wife at bedside -External records from outside source obtained and reviewed including: Chart review including previous notes, labs, imaging, consultation notes   Lab Tests: -I ordered, reviewed, and interpreted labs.   The pertinent results include:   Labs Reviewed  BASIC METABOLIC PANEL - Abnormal; Notable for the following components:      Result Value  Glucose, Bld 109 (*)    Creatinine, Ser 1.27 (*)    GFR, Estimated 58 (*)    All other components within normal limits  CBC WITH DIFFERENTIAL/PLATELET - Abnormal; Notable for the following components:   RBC 4.02 (*)    Hemoglobin 12.9 (*)    All other components within normal limits  CBG MONITORING, ED - Abnormal; Notable for the following components:   Glucose-Capillary 116 (*)    All other components within normal limits      EKG   EKG Interpretation Date/Time:    Ventricular Rate:    PR Interval:    QRS Duration:    QT Interval:    QTC Calculation:   R Axis:      Text Interpretation:           Imaging Studies ordered: I ordered imaging studies including CT imaging of the head I independently visualized and interpreted imaging. I agree with the radiologist interpretation   Medicines ordered and prescription drug management: No orders of the defined types were placed in this encounter.   -I have reviewed the patients home medicines and have made adjustments as needed   Cardiac Monitoring: The patient was maintained on a cardiac monitor.  I personally viewed and interpreted the cardiac monitored which showed an underlying rhythm of:   Social Determinants of Health:  Factors impacting patients care include:    Reevaluation: After the interventions noted above, I reevaluated the patient and found that they have :improved  Co morbidities that complicate the patient evaluation  Past Medical History:  Diagnosis Date   Anxiety    Atrial fibrillation (HCC)  07/06/2013   CKD (chronic kidney disease), stage III (HCC)    Essential hypertension, benign 07/06/2013   Kidney stones    some passed spontaneous, one retrieved by cystoscope    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   LIMA to LAD, Sequential SVG to intermediate and OM1, SVG to RCA, EVH via right thigh   S/P Maze operation for atrial fibrillation 07/22/2013   Complete bilateral atrial lesion set using bipolar radiofrequency and cryothermy ablation with clipping of LA appendage    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  Will have him follow-up with his PCP.     Final Clinical Impression(s) / ED Diagnoses Final diagnoses:  Transient global amnesia     @PCDICTATION @    Mannie Pac T, DO 01/01/24 1839

## 2024-01-01 NOTE — ED Triage Notes (Signed)
 Pt states he is having a hard time remembering stuff and he has been having high BP (185/177 at home). States this has happened before.

## 2024-01-01 NOTE — Discharge Instructions (Addendum)
 Like we discussed, keep a blood pressure log in the morning the evening for the next 2 weeks and follow-up with your primary care doctor to discuss your blood pressure management.  You can also bring up your episode of transient global amnesia.  Like we discussed, I think that this is probably related to your previous viral illnesses.

## 2024-01-01 NOTE — ED Notes (Signed)
 Pt states he was diagnosed with global amnesia approximately 1 year ago and memory loss lasted a few days. Today he presents with transient memory loss that started today, wife states that he took their dog to be groomed this AM and when he returned home he could not remember they even had a dog. He is currently alert to self and situation but did not know the year.

## 2024-01-23 DIAGNOSIS — J019 Acute sinusitis, unspecified: Secondary | ICD-10-CM | POA: Diagnosis not present

## 2024-01-23 DIAGNOSIS — R051 Acute cough: Secondary | ICD-10-CM | POA: Diagnosis not present

## 2024-02-20 ENCOUNTER — Ambulatory Visit: Payer: Medicare Other | Admitting: Physician Assistant

## 2024-02-28 DIAGNOSIS — C4361 Malignant melanoma of right upper limb, including shoulder: Secondary | ICD-10-CM | POA: Diagnosis not present

## 2024-03-05 ENCOUNTER — Telehealth: Payer: Self-pay | Admitting: Cardiovascular Disease

## 2024-03-05 DIAGNOSIS — I2510 Atherosclerotic heart disease of native coronary artery without angina pectoris: Secondary | ICD-10-CM

## 2024-03-05 DIAGNOSIS — E782 Mixed hyperlipidemia: Secondary | ICD-10-CM

## 2024-03-05 DIAGNOSIS — I10 Essential (primary) hypertension: Secondary | ICD-10-CM

## 2024-03-05 NOTE — Telephone Encounter (Signed)
 Wife is calling in requesting that the patient get labs done. Please advise

## 2024-03-06 ENCOUNTER — Other Ambulatory Visit: Payer: Self-pay

## 2024-03-06 DIAGNOSIS — I10 Essential (primary) hypertension: Secondary | ICD-10-CM

## 2024-03-06 DIAGNOSIS — I2510 Atherosclerotic heart disease of native coronary artery without angina pectoris: Secondary | ICD-10-CM

## 2024-03-06 DIAGNOSIS — E782 Mixed hyperlipidemia: Secondary | ICD-10-CM

## 2024-03-06 NOTE — Telephone Encounter (Signed)
 Lab orders placed. Patient contacted and advise.

## 2024-03-06 NOTE — Telephone Encounter (Signed)
 Recommend CBC, CMET, lipid panel, HgB A1C. Thanks

## 2024-03-09 DIAGNOSIS — R7303 Prediabetes: Secondary | ICD-10-CM | POA: Diagnosis not present

## 2024-03-09 DIAGNOSIS — I2510 Atherosclerotic heart disease of native coronary artery without angina pectoris: Secondary | ICD-10-CM | POA: Diagnosis not present

## 2024-03-09 DIAGNOSIS — I10 Essential (primary) hypertension: Secondary | ICD-10-CM | POA: Diagnosis not present

## 2024-03-09 DIAGNOSIS — E782 Mixed hyperlipidemia: Secondary | ICD-10-CM | POA: Diagnosis not present

## 2024-03-09 LAB — CBC

## 2024-03-09 NOTE — Progress Notes (Unsigned)
 Cardiology Office Note:    Date:  03/12/2024   ID:  Henry Page, DOB 02/02/1946, MRN [id number]  PCP:  Patient, No Pcp Per   [facility] Providers Cardiologist:  Tonny Bollman, MD     Referring MD: Ignatius Specking, MD   Chief Complaint  Patient presents with   Atrial Fibrillation    History of Present Illness:    Henry Page is a 78 y.o. male with a hx of mitral valve disease, coronary artery disease, and atrial fibrillation. The patient underwent CABG, mitral valve repair, and maze procedure in 2014.  He developed recurrent atrial fibrillation in 2018 and underwent TEE guided cardioversion at that time.    He developed recurrent atrial fibrillation and was seen in the atrial fibrillation clinic last in 2019.  He declined antiarrhythmic drug therapy at that time.  He then saw Dr. Johney Frame in consultation in 2020 and again elected to continue with rate control and anticoagulation. He is here with his wife today.    The patient is here alone today. He had shingles last fall involving his forehead and eye and still has some numbness in that area. He had a kidney stone a few days ago and was able to pass the stone on his own. He's also had a few episodes of transient global amnesia. He's having issues with labile blood pressure readings. He has cut back on scotch at night (was drinking 1-2 daily now 1-2 per week). He's lost 10 pounds with shingles and notes that his BP has been running too low at home sometimes associated with marked weakness and fatigue.   Current Medications: No outpatient medications have been marked as taking for the 03/10/24 encounter (Office Visit) with Tonny Bollman, MD.     Allergies:   Shellfish allergy, Sulfa antibiotics, Meperidine and related, and Misc. sulfonamide containing compounds   ROS:   Please see the history of present illness.    All other systems reviewed and are negative.  EKGs/Labs/Other Studies Reviewed:    The following studies  were reviewed today: Cardiac Studies & Procedures   ______________________________________________________________________________________________     ECHOCARDIOGRAM  ECHOCARDIOGRAM COMPLETE 03/20/2022  Narrative ECHOCARDIOGRAM REPORT    Patient Name:   Henry Page Date of Exam: 03/20/2022 Medical Rec #:  [id number]     Height:       70.0 in Accession #:    [id number]    Weight:       186.6 lb Date of Birth:  January 22, 1947     BSA:          2.027 m Patient Age:    74 years      BP:           164/111 mmHg Patient Gender: M             HR:           68 bpm. Exam Location:  [address]  Procedure: 2D Echo and 3D Echo  Indications:    Z98.890 s/p MVR  History:        Patient has prior history of Echocardiogram examinations, most recent 04/14/2020. Prior CABG, CKD, Arrythmias:Atrial Fibrillation; Risk Factors:Sleep Apnea. /P Maze operation for atrial fibrillation 07/22/2013 Complete bilateral atrial lesion set using bipolar radiofrequency and cryothermy ablation with clipping of LA appendage.  Mitral Valve: Complex valvuloplasty including triangular resection of posterior leaflet with artificial Gore-tex neocord placement x2 and 34mm Sorin Memo 3D ring annuloplasty valve is present in the mitral  position. Procedure Date: 07/22/2013.  Sonographer:    Clearence Ped RCS Referring Phys: [phone] Lynze Reddy  IMPRESSIONS   1. Left ventricular ejection fraction, by estimation, is 55 to 60%. The left ventricle has normal function. The left ventricle has no regional wall motion abnormalities. There is mild left ventricular hypertrophy. Left ventricular diastolic parameters are indeterminate. 2. Right ventricular systolic function is normal. The right ventricular size is moderately enlarged. There is normal pulmonary artery systolic pressure. 3. Left atrial size was moderately dilated. 4. Right atrial size was moderately dilated. 5. MV mean gradient 5 mmHg HR 68 bpm. MV peak gradient  15 mmhg. . Mild mitral valve regurgitation. There is a Complex valvuloplasty including triangular resection of posterior leaflet with artificial Gore-tex neocord placement x2 and 34mm Sorin Memo 3D ring annuloplasty present in the mitral position. Procedure Date: 07/22/2013. 6. The aortic valve is normal in structure. Aortic valve regurgitation is not visualized. 7. 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 8. The inferior vena cava is normal in size with greater than 50% respiratory variability, suggesting right atrial pressure of 3 mmHg.  Comparison(s): No significant change from prior study.  FINDINGS Left Ventricle: Left ventricular ejection fraction, by estimation, is 55 to 60%. The left ventricle has normal function. The left ventricle has no regional wall motion abnormalities. The left ventricular internal cavity size was normal in size. There is mild left ventricular hypertrophy. Left ventricular diastolic parameters are indeterminate.  Right Ventricle: The right ventricular size is moderately enlarged. No increase in right ventricular wall thickness. Right ventricular systolic function is normal. There is normal pulmonary artery systolic pressure. The tricuspid regurgitant velocity is 2.61 m/s, and with an assumed right atrial pressure of 3 mmHg, the estimated right ventricular systolic pressure is 30.2 mmHg.  Left Atrium: Left atrial size was moderately dilated.  Right Atrium: Right atrial size was moderately dilated.  Pericardium: There is no evidence of pericardial effusion.  Mitral Valve: MV mean gradient 5 mmHg HR 68 bpm. MV peak gradient 15 mmhg. Mild mitral valve regurgitation. There is a Complex valvuloplasty including triangular resection of posterior leaflet with artificial Gore-tex neocord placement x2 and 34mm Sorin Memo 3D ring annuloplasty present in the mitral position. Procedure Date: 07/22/2013. MV peak gradient, 15.2 mmHg. The mean mitral valve gradient is 5.0  mmHg.  Tricuspid Valve: The tricuspid valve is grossly normal. Tricuspid valve regurgitation is mild.  Aortic Valve: The aortic valve is normal in structure. Aortic valve regurgitation is not visualized.  Pulmonic Valve: The pulmonic valve was not well visualized. Pulmonic valve regurgitation is trivial.  Aorta: Small ascending aortic aneurysm 4.2 cm.  Venous: The inferior vena cava is normal in size with greater than 50% respiratory variability, suggesting right atrial pressure of 3 mmHg.  IAS/Shunts: The interatrial septum was not well visualized.   LEFT VENTRICLE PLAX 2D LVIDd:         4.60 cm   Diastology LVIDs:         3.40 cm   LV e' medial:    10.40 cm/s LV PW:         1.30 cm   LV E/e' medial:  15.9 LV IVS:        1.20 cm   LV e' lateral:   10.40 cm/s LVOT diam:     2.10 cm   LV E/e' lateral: 15.9 LV SV:         55 LV SV Index:   27 LVOT Area:  3.46 cm  3D Volume EF: 3D EF:        59 % LV EDV:       88 ml LV ESV:       36 ml LV SV:        52 ml  RIGHT VENTRICLE RV Basal diam:  5.00 cm RV Mid diam:    4.30 cm RV S prime:     11.70 cm/s TAPSE (M-mode): 1.5 cm RVSP:           30.2 mmHg  LEFT ATRIUM              Index        RIGHT ATRIUM           Index LA diam:        5.40 cm  2.66 cm/m   RA Pressure: 3.00 mmHg LA Vol (A2C):   114.0 ml 56.25 ml/m  RA Area:     26.80 cm LA Vol (A4C):   90.4 ml  44.60 ml/m  RA Volume:   87.50 ml  43.17 ml/m LA Biplane Vol: 102.0 ml 50.33 ml/m AORTIC VALVE LVOT Vmax:   63.90 cm/s LVOT Vmean:  44.700 cm/s LVOT VTI:    0.159 m  AORTA Ao Root diam: 3.50 cm Ao Asc diam:  4.20 cm  MITRAL VALVE                TRICUSPID VALVE MV Area (PHT): 2.36 cm     TR Peak grad:   27.2 mmHg MV Area VTI:   1.23 cm     TR Vmax:        261.00 cm/s MV Peak grad:  15.2 mmHg    Estimated RAP:  3.00 mmHg MV Mean grad:  5.0 mmHg     RVSP:           30.2 mmHg MV Vmax:       1.95 m/s MV Vmean:      92.9 cm/s    SHUNTS MV Decel Time: 322  msec     Systemic VTI:  0.16 m MV E velocity: 165.00 cm/s  Systemic Diam: 2.10 cm  Carolan Clines Electronically signed by Carolan Clines Signature Date/Time: 03/20/2022/1:06:20 PM    Final   TEE  ECHO TEE 02/08/2017  Narrative *[facility]* *[facility]* [address] [place], [place] [address] [phone]  ------------------------------------------------------------------- Transesophageal Echocardiography  Patient:    Henry Page, Henry Page MR #:       [id number] Study Date: 02/08/2017 Gender:     M Age:        69 Height:     177.8 cm Weight:     81.9 kg BSA:        2 m^2 Pt. Status: Room:  ADMITTING    Tobias Alexander, M.D. ATTENDING    Tobias Alexander, M.D. ORDERING     Tobias Alexander, M.D. PERFORMING   Tobias Alexander, M.D. REFERRING    Tobias Alexander, M.D. SONOGRAPHER  Sheralyn Boatman  cc:  ------------------------------------------------------------------- LV EF: 50% -   55%  ------------------------------------------------------------------- Study Conclusions  - Left ventricle: There was mild concentric hypertrophy. Systolic function was normal. The estimated ejection fraction was in the range of 50% to 55%. Diffuse hypokinesis. - Aortic valve: Structurally normal valve. Trileaflet; normal thickness leaflets. No evidence of vegetation. - Aorta: There was mild non-mobile atheroma. - Ascending aorta: The ascending aorta was upper noral size measuring 40 mm. - Mitral valve: Leaflet separation was normal. Mean gradient (D):  2 mm Hg. - Left atrium: The atrium was dilated. No evidence of thrombus in the atrial cavity or appendage. No evidence of thrombus. The appendage was ligated with no residual flow. - Right ventricle: The cavity size was normal. Wall thickness was normal. - Right atrium: No evidence of thrombus in the atrial cavity or appendage. - Pulmonary arteries: Systolic pressure was within the normal range.  Impressions:  - The  appendage was ligated with no residual flow. No thrombus was seen in the left atrium. TEE was followed by a successful cardioversion.  ------------------------------------------------------------------- Study data:   Procedure:  Initial setup. The patient was brought to the laboratory. Surface ECG leads were monitored. Sedation. Conscious sedation was administered. Transesophageal echocardiography. Topical anesthesia was obtained using viscous lidocaine. A transesophageal probe was inserted by the attending cardiologist. Image quality was adequate.  Study completion:  The patient tolerated the procedure well. There were no complications. Diagnostic transesophageal echocardiography.  2D and color Doppler.  Birthdate:  Patient birthdate: 1947/11/19.  Age:  Patient is 78 yr old.  Sex:  Gender: male.    BMI: 25.9 kg/m^2.  Blood pressure:     150/98  Study date:  Study date: 02/08/2017. Study time: 09:47 AM.  -------------------------------------------------------------------  ------------------------------------------------------------------- Left ventricle:  There was mild concentric hypertrophy. Systolic function was normal. The estimated ejection fraction was in the range of 50% to 55%. Diffuse hypokinesis.  ------------------------------------------------------------------- Aortic valve:   Structurally normal valve. Trileaflet; normal thickness leaflets. Cusp separation was normal.  No evidence of vegetation.  Doppler:  There was no regurgitation.  ------------------------------------------------------------------- Aorta:  There was mild non-mobile atheroma. Aortic root: The aortic root was not dilated. Ascending aorta: The ascending aorta was upper noral size measuring 40 mm. Descending aorta: The descending aorta was normal in size.  ------------------------------------------------------------------- Mitral valve:  S/P repair. There are two jets of mitral regurgitation with  associated mild to moderate mitral regurgitation. Leaflet separation was normal.  Doppler:     Mean gradient (D): 2 mm Hg.  ------------------------------------------------------------------- Left atrium:  The atrium was dilated.  No evidence of thrombus in the atrial cavity or appendage.  No evidence of thrombus. The appendage was ligated with no residual flow.  ------------------------------------------------------------------- Right ventricle:  The cavity size was normal. Wall thickness was normal. Systolic function was normal.  ------------------------------------------------------------------- Pulmonic valve:    Structurally normal valve.  ------------------------------------------------------------------- Tricuspid valve:   Mildly thickened leaflets. Leaflet separation was normal.  Doppler:  There was no significant regurgitation.  ------------------------------------------------------------------- Pulmonary artery:   The main pulmonary artery was normal-sized. Systolic pressure was within the normal range.  ------------------------------------------------------------------- Right atrium:  The atrium was normal in size.  No evidence of thrombus in the atrial cavity or appendage. The appendage was morphologically a right appendage.  ------------------------------------------------------------------- Pericardium:  There was no pericardial effusion.  ------------------------------------------------------------------- Measurements  Mitral valve                         Value Mitral mean velocity, D              52.5  cm/s Mitral mean gradient, D              2     mm Hg Mitral annulus VTI, D                32.4  cm  Tricuspid valve  Value Tricuspid regurg peak velocity       218   cm/s Tricuspid peak RV-RA gradient        19    mm Hg  Legend: (L)  and  (H)  mark values outside specified reference  range.  ------------------------------------------------------------------- Prepared and Electronically Authenticated by  Tobias Alexander, M.D. 2018-02-16T11:10:40        ______________________________________________________________________________________________      EKG:        Recent Labs: 03/09/2024: ALT 23; Hemoglobin 12.2; Platelets 337 03/10/2024: BUN 22; Creatinine, Ser 1.47; Potassium 5.0; Sodium 139  Recent Lipid Panel    Component Value Date/Time   CHOL 108 03/09/2024 1059   TRIG 133 03/09/2024 1059   HDL 31 (L) 03/09/2024 1059   CHOLHDL 3.5 03/09/2024 1059   CHOLHDL 3.4 06/05/2016 1048   VLDL 50 (H) 06/05/2016 1048   LDLCALC 53 03/09/2024 1059   LDLDIRECT 42.0 04/26/2015 0950       Physical Exam:    VS:  BP (!) 144/100   Pulse 75   Ht 5\' 10"  (1.778 m)   Wt 186 lb 12.8 oz (84.7 kg)   SpO2 99%   BMI 26.80 kg/m     Wt Readings from Last 3 Encounters:  03/10/24 186 lb 12.8 oz (84.7 kg)  01/01/24 180 lb (81.6 kg)  09/30/23 185 lb (83.9 kg)     GEN:  Well nourished, well developed in no acute distress HEENT: Normal NECK: No JVD; No carotid bruits LYMPHATICS: No lymphadenopathy CARDIAC: RRR, no murmurs, rubs, gallops RESPIRATORY:  Clear to auscultation without rales, wheezing or rhonchi  ABDOMEN: Soft, non-tender, non-distended MUSCULOSKELETAL:  No edema; No deformity  SKIN: Warm and dry NEUROLOGIC:  Alert and oriented x 3 PSYCHIATRIC:  Normal affect   Assessment & Plan Essential hypertension, benign Patient's home blood pressure readings are running low.  Will hold benazepril especially in light of his hyperkalemia.  He has been adjusting his medicines based on his blood pressure readings.  When his blood pressure is less than 125 mmHg which it is most of the time, he cut his carvedilol in half and hold his benazepril.  I told him I would really rather him take his scheduled medicine, so hopefully he can stay on carvedilol 25 mg twice daily now  that he will be off of benazepril.  While his blood pressure today in the office is elevated, he has longstanding whitecoat hypertension and this is not reflective of his home readings which have been in the low normal range consistently.  I advised that if his blood pressure is less than 120 mmHg, to take 12.5 mg of carvedilol rather than 25 mg. Mixed hyperlipidemia Lipids are excellent.  I reviewed his recent labs.  Cholesterol is 116, LDL 47, triglycerides 152, HDL 43.  Continue current therapy.  Treated with rosuvastatin 10 mg daily. Coronary artery disease involving native coronary artery of native heart without angina pectoris No anginal symptoms.  No antiplatelet therapy because of chronic oral anticoagulation with apixaban. Persistent atrial fibrillation (HCC) Anticoagulated with apixaban, rate controlled with carvedilol. S/P mitral valve repair Normal mitral valve function on serial echo follow-up.  I will repeat the patient's metabolic panel today as his potassium yesterday was 5.7.  He may need to change to a low potassium diet but we will see what his repeat labs look like just to make sure this is not reflective of hemolysis.  States that he eats a lot of bananas and tomatoes.  Patient will  follow-up in 6 months. Hyperkalemia Repeat BMET as above. Counseled regarding low potassium diet.             Medication Adjustments/Labs and Tests Ordered: Current medicines are reviewed at length with the patient today.  Concerns regarding medicines are outlined above.  Orders Placed This Encounter  Procedures   Basic metabolic panel   No orders of the defined types were placed in this encounter.   Patient Instructions  Medications: STOP Benazepril  Lab Work: BMET today If you have labs (blood work) drawn today and your tests are completely normal, you will receive your results only by: MyChart Message (if you have MyChart) OR A paper copy in the mail If you have any lab test  that is abnormal or we need to change your treatment, we will call you to review the results.  Follow-Up: At [facility], you and your health needs are our priority.  As part of our continuing mission to provide you with exceptional heart care, we have created designated Provider Care Teams.  These Care Teams include your primary Cardiologist (physician) and Advanced Practice Providers (APPs -  Physician Assistants and Nurse Practitioners) who all work together to provide you with the care you need, when you need it.  Your next appointment:   6 month(s)  Provider:   Tonny Bollman, MD      1st Floor: - Lobby - Registration  - Pharmacy  - Lab - Cafe  2nd Floor: - PV Lab - Diagnostic Testing (echo, CT, nuclear med)  3rd Floor: - Vacant  4th Floor: - TCTS (cardiothoracic surgery) - AFib Clinic - Structural Heart Clinic - Vascular Surgery  - Vascular Ultrasound  5th Floor: - HeartCare Cardiology (general and EP) - Clinical Pharmacy for coumadin, hypertension, lipid, weight-loss medications, and med management appointments    Valet parking services will be available as well.     Signed, Tonny Bollman, MD  03/12/2024 7:45 AM    [facility]

## 2024-03-10 ENCOUNTER — Encounter: Payer: Self-pay | Admitting: Cardiovascular Disease

## 2024-03-10 ENCOUNTER — Ambulatory Visit: Payer: Medicare Other | Attending: Cardiovascular Disease | Admitting: Cardiovascular Disease

## 2024-03-10 VITALS — BP 144/100 | HR 75 | Ht 70.0 in | Wt 186.8 lb

## 2024-03-10 DIAGNOSIS — I2510 Atherosclerotic heart disease of native coronary artery without angina pectoris: Secondary | ICD-10-CM | POA: Diagnosis not present

## 2024-03-10 DIAGNOSIS — E875 Hyperkalemia: Secondary | ICD-10-CM

## 2024-03-10 DIAGNOSIS — I10 Essential (primary) hypertension: Secondary | ICD-10-CM

## 2024-03-10 DIAGNOSIS — Z9889 Other specified postprocedural states: Secondary | ICD-10-CM | POA: Diagnosis not present

## 2024-03-10 DIAGNOSIS — I4819 Other persistent atrial fibrillation: Secondary | ICD-10-CM

## 2024-03-10 DIAGNOSIS — E782 Mixed hyperlipidemia: Secondary | ICD-10-CM

## 2024-03-10 LAB — CBC
Hematocrit: 37.9 % (ref 37.5–51.0)
Hemoglobin: 12.2 g/dL — ABNORMAL LOW (ref 13.0–17.7)
MCH: 30 pg (ref 26.6–33.0)
MCHC: 32.2 g/dL (ref 31.5–35.7)
MCV: 93 fL (ref 79–97)
Platelets: 337 10*3/uL (ref 150–450)
RBC: 4.06 x10E6/uL — ABNORMAL LOW (ref 4.14–5.80)
RDW: 13.9 % (ref 11.6–15.4)
WBC: 5.9 10*3/uL (ref 3.4–10.8)

## 2024-03-10 LAB — COMPREHENSIVE METABOLIC PANEL
ALT: 23 IU/L (ref 0–44)
AST: 32 IU/L (ref 0–40)
Albumin: 3.9 g/dL (ref 3.8–4.8)
Alkaline Phosphatase: 112 IU/L (ref 44–121)
BUN/Creatinine Ratio: 14 (ref 10–24)
BUN: 20 mg/dL (ref 8–27)
Bilirubin Total: 0.3 mg/dL (ref 0.0–1.2)
CO2: 24 mmol/L (ref 20–29)
Calcium: 9.8 mg/dL (ref 8.6–10.2)
Chloride: 103 mmol/L (ref 96–106)
Creatinine, Ser: 1.45 mg/dL — ABNORMAL HIGH (ref 0.76–1.27)
Globulin, Total: 3.2 g/dL (ref 1.5–4.5)
Glucose: 92 mg/dL (ref 70–99)
Potassium: 5.7 mmol/L — ABNORMAL HIGH (ref 3.5–5.2)
Sodium: 140 mmol/L (ref 134–144)
Total Protein: 7.1 g/dL (ref 6.0–8.5)
eGFR: 49 mL/min/{1.73_m2} — ABNORMAL LOW (ref >59)

## 2024-03-10 LAB — BASIC METABOLIC PANEL
BUN/Creatinine Ratio: 15 (ref 10–24)
BUN: 22 mg/dL (ref 8–27)
CO2: 23 mmol/L (ref 20–29)
Calcium: 9.9 mg/dL (ref 8.6–10.2)
Chloride: 102 mmol/L (ref 96–106)
Creatinine, Ser: 1.47 mg/dL — ABNORMAL HIGH (ref 0.76–1.27)
Glucose: 97 mg/dL (ref 70–99)
Potassium: 5 mmol/L (ref 3.5–5.2)
Sodium: 139 mmol/L (ref 134–144)
eGFR: 49 mL/min/{1.73_m2} — ABNORMAL LOW (ref >59)

## 2024-03-10 LAB — LIPID PANEL
Chol/HDL Ratio: 3.5 ratio (ref 0.0–5.0)
Cholesterol, Total: 108 mg/dL (ref 100–199)
HDL: 31 mg/dL — ABNORMAL LOW (ref >39)
LDL Chol Calc (NIH): 53 mg/dL (ref 0–99)
Triglycerides: 133 mg/dL (ref 0–149)
VLDL Cholesterol Cal: 24 mg/dL (ref 5–40)

## 2024-03-10 LAB — HEMOGLOBIN A1C
Est. average glucose Bld gHb Est-mCnc: 117 mg/dL
Hgb A1c MFr Bld: 5.7 % — ABNORMAL HIGH (ref 4.8–5.6)

## 2024-03-10 NOTE — Assessment & Plan Note (Signed)
 Patient's home blood pressure readings are running low.  Will hold benazepril especially in light of his hyperkalemia.  He has been adjusting his medicines based on his blood pressure readings.  When his blood pressure is less than 125 mmHg which it is most of the time, he cut his carvedilol in half and hold his benazepril.  I told him I would really rather him take his scheduled medicine, so hopefully he can stay on carvedilol 25 mg twice daily now that he will be off of benazepril.  While his blood pressure today in the office is elevated, he has longstanding whitecoat hypertension and this is not reflective of his home readings which have been in the low normal range consistently.  I advised that if his blood pressure is less than 120 mmHg, to take 12.5 mg of carvedilol rather than 25 mg.

## 2024-03-10 NOTE — Assessment & Plan Note (Signed)
 Lipids are excellent.  I reviewed his recent labs.  Cholesterol is 116, LDL 47, triglycerides 152, HDL 43.  Continue current therapy.  Treated with rosuvastatin 10 mg daily.

## 2024-03-10 NOTE — Assessment & Plan Note (Signed)
 No anginal symptoms.  No antiplatelet therapy because of chronic oral anticoagulation with apixaban.

## 2024-03-10 NOTE — Assessment & Plan Note (Signed)
 Normal mitral valve function on serial echo follow-up.  I will repeat the patient's metabolic panel today as his potassium yesterday was 5.7.  He may need to change to a low potassium diet but we will see what his repeat labs look like just to make sure this is not reflective of hemolysis.  States that he eats a lot of bananas and tomatoes.  Patient will follow-up in 6 months.

## 2024-03-10 NOTE — Patient Instructions (Addendum)
 Medications: STOP Benazepril  Lab Work: BMET today If you have labs (blood work) drawn today and your tests are completely normal, you will receive your results only by: [employer] (if you have MyChart) OR A paper copy in the mail If you have any lab test that is abnormal or we need to change your treatment, we will call you to review the results.  Follow-Up: At [facility], you and your health needs are our priority.  As part of our continuing mission to provide you with exceptional heart care, we have created designated Provider Care Teams.  These Care Teams include your primary Cardiologist (physician) and Advanced Practice Providers (APPs -  Physician Assistants and Nurse Practitioners) who all work together to provide you with the care you need, when you need it.  Your next appointment:   6 month(s)  Provider:   Tonny Bollman, MD      1st Floor: - Lobby - Registration  - Pharmacy  - Lab - Cafe  2nd Floor: - PV Lab - Diagnostic Testing (echo, CT, nuclear med)  3rd Floor: - Vacant  4th Floor: - TCTS (cardiothoracic surgery) - AFib Clinic - Structural Heart Clinic - Vascular Surgery  - Vascular Ultrasound  5th Floor: - HeartCare Cardiology (general and EP) - Clinical Pharmacy for coumadin, hypertension, lipid, weight-loss medications, and med management appointments    Valet parking services will be available as well.

## 2024-03-12 ENCOUNTER — Encounter: Payer: Self-pay | Admitting: Cardiovascular Disease

## 2024-03-16 ENCOUNTER — Other Ambulatory Visit: Payer: Self-pay | Admitting: Cardiovascular Disease

## 2024-04-06 ENCOUNTER — Telehealth: Payer: Self-pay | Admitting: Cardiovascular Disease

## 2024-04-06 NOTE — Telephone Encounter (Signed)
 Spoke with pt regarding his carvedilol. Pt is aware to take half the dose of the 25 mg Carvedilol if his SBP is less than 120 per Dr. Katheryne Pane note. Pt stated his blood pressures have been running 100/60s and he is fearful of taking even just half of the dose as he has passed out from low blood pressure in the past so he has been holding the Carvedilol for pressures under 120. Pt has noticed his pulse has been fluctuating lately and wonders if this is from not taking the full dose of the medication. Pt was told to take his medication as prescribed and that the information he has provided would be forwarded to Dr. Arlester Ladd and his nurse. Pt verbalized understanding. All questions, if any, were answered.

## 2024-04-06 NOTE — Telephone Encounter (Signed)
  Pt c/o medication issue:  1. Name of Medication: carvedilol (COREG) 25 MG tablet   2. How are you currently taking this medication (dosage and times per day)?   Take 1 tablet (25 mg total) by mouth 2 (two) times daily.    3. Are you having a reaction (difficulty breathing--STAT)? No   4. What is your medication issue? The patient would like to discuss this medication with the nurse and get a recommendation from Dr. Arlester Ladd. He stated that Dr. Arlester Ladd advised him to take half of his Carvedilol dose if his blood pressure is low. He explained that if his systolic BP is around 110-115, he takes the medication as usual, but if it's in the 90-102 range, he skips it. Although his BP has been stable, he feels that skipping Carvedilol is preventing him from getting its full benefit for his atrial fibrillation. He noted that his heart rate fluctuates between the upper 80s to 90s, which is higher than his usual.

## 2024-04-07 MED ORDER — CARVEDILOL 12.5 MG PO TABS: 12.5000 mg | ORAL_TABLET | Freq: Two times a day (BID) | ORAL | Status: DC

## 2024-04-07 NOTE — Telephone Encounter (Signed)
 Spoke with pt regarding his prescription for carvedilol and the parameters for taking it. Pt stated he had enough pills and did not need a new prescription to be ordered. He plans to cut his 25 mg tablets in half. The prescription was updated in the chart. Pt verbalized understanding. All questions if any were answered. Pt also stated he received a device to complete the [facility] home sleep study months ago, but never received a pin #. Will forward to Ellena Gurney for assistance.

## 2024-04-07 NOTE — Telephone Encounter (Signed)
 RETURN CALL: Called patient to remind him his wife was given the pin when he was sick with the shingles by Jerrell Mora. Then he remembered the call.  Per Jerrell Mora, Patient notified of PIN ([phone]) on 11/25/2023 via Notification Method: phone. The pt was not available so I provided the pts wife, Sallyanne Creamer with the [employer] One-HST Pin # of "[id number]".

## 2024-04-07 NOTE — Telephone Encounter (Signed)
 Recommend reduction in carvedilol dose to 12.5 mg BID and if systolic BP is less than 100 mmHg, don't take it.

## 2024-05-08 DIAGNOSIS — R590 Localized enlarged lymph nodes: Secondary | ICD-10-CM | POA: Diagnosis not present

## 2024-05-08 DIAGNOSIS — R030 Elevated blood-pressure reading, without diagnosis of hypertension: Secondary | ICD-10-CM | POA: Diagnosis not present

## 2024-05-11 DIAGNOSIS — Z961 Presence of intraocular lens: Secondary | ICD-10-CM | POA: Diagnosis not present

## 2024-05-11 DIAGNOSIS — B0239 Other herpes zoster eye disease: Secondary | ICD-10-CM | POA: Diagnosis not present

## 2024-05-11 DIAGNOSIS — H02834 Dermatochalasis of left upper eyelid: Secondary | ICD-10-CM | POA: Diagnosis not present

## 2024-05-11 DIAGNOSIS — H02831 Dermatochalasis of right upper eyelid: Secondary | ICD-10-CM | POA: Diagnosis not present

## 2024-05-11 DIAGNOSIS — H02832 Dermatochalasis of right lower eyelid: Secondary | ICD-10-CM | POA: Diagnosis not present

## 2024-05-11 DIAGNOSIS — H02835 Dermatochalasis of left lower eyelid: Secondary | ICD-10-CM | POA: Diagnosis not present

## 2024-05-11 DIAGNOSIS — H02412 Mechanical ptosis of left eyelid: Secondary | ICD-10-CM | POA: Diagnosis not present

## 2024-05-18 ENCOUNTER — Other Ambulatory Visit: Payer: Self-pay | Admitting: Cardiovascular Disease

## 2024-06-16 ENCOUNTER — Other Ambulatory Visit: Payer: Self-pay | Admitting: Cardiovascular Disease

## 2024-06-16 DIAGNOSIS — I480 Paroxysmal atrial fibrillation: Secondary | ICD-10-CM

## 2024-06-16 NOTE — Telephone Encounter (Signed)
 Prescription refill request for Eliquis  received. Indication: AF Last office visit: 03/10/24  CHRISTELLA Fell MD Scr: 1.47 Age: 78 Weight: 84.7kg  Based on above findings Eliquis  5mg  twice daily is the appropriate dose.  Refill approved.

## 2024-07-27 ENCOUNTER — Other Ambulatory Visit: Payer: Self-pay | Admitting: Cardiovascular Disease

## 2024-07-29 ENCOUNTER — Other Ambulatory Visit: Payer: Self-pay | Admitting: Cardiovascular Disease

## 2024-07-29 NOTE — Telephone Encounter (Signed)
 Pt requesting 25mg  bid

## 2024-07-29 NOTE — Telephone Encounter (Signed)
 For review

## 2024-08-03 ENCOUNTER — Other Ambulatory Visit: Payer: Self-pay

## 2024-08-03 DIAGNOSIS — E782 Mixed hyperlipidemia: Secondary | ICD-10-CM

## 2024-08-03 DIAGNOSIS — I10 Essential (primary) hypertension: Secondary | ICD-10-CM

## 2024-08-03 NOTE — Telephone Encounter (Signed)
 Ok to write coreg  as you suggest at 25 mg BID. Let's do a CMET prior to his return visit. thanks

## 2024-08-03 NOTE — Progress Notes (Signed)
 Spoke with pt over the phone and let him know Dr. Wonda is ordering a CMP prior to OV on 9/22. Pt verbalized understanding of plan and had no further questions at this time.

## 2024-08-18 ENCOUNTER — Telehealth: Payer: Self-pay | Admitting: Physician Assistant

## 2024-08-18 NOTE — Telephone Encounter (Signed)
 Shona the wife of the pt is stating that her husband has been experiencing Amnesia  4 times in a month and she wants to see what needs to be done for her husband . Thanks

## 2024-08-18 NOTE — Telephone Encounter (Signed)
 Patient has not been here since 02/272024  cancelled 02/20/2024 no follow up with Camie Sevin, PA-C. Please advise.

## 2024-08-19 ENCOUNTER — Telehealth: Payer: Self-pay | Admitting: Cardiovascular Disease

## 2024-08-19 NOTE — Telephone Encounter (Signed)
 Pt. S wife would like help now, does not want to got to hospital, no pcp, would like call bk from nurse on who can see her husband ASAP

## 2024-08-19 NOTE — Telephone Encounter (Signed)
 I spoke with patient, he is going to get in with a PCP. He thanked me for calling.

## 2024-08-19 NOTE — Telephone Encounter (Signed)
 4 espiodes of memory loss like amnesia, same questions over and over, wife says they have NO PCP, wants to be seen or placed on waiting list here. Please advise. Asked me to route this for a recommendation.

## 2024-08-19 NOTE — Telephone Encounter (Signed)
 Wife says patient is having trouble with instructions for sleep study. Would like assistance with getting patient set up if possible.

## 2024-08-19 NOTE — Telephone Encounter (Signed)
 No answer at 10;12am 08/19/2024

## 2024-08-20 NOTE — Telephone Encounter (Signed)
**Note De-Identified Julyanna Scholle Obfuscation** I called and s/w the pts wife, Shona and helped her download the Watch[employer] One app on the pts phone then I went over all instructions with her. She is aware that if they have problems with the device on the night he plans to do his home sleep test to call the company at [phone] or [phone] [employer] for assistance and that if they can not help them to set the test aside and to call us  the next morning and will check to see if there is anything we need to do.  She verbalized understanding to all of the above and thanked me for calling them back.

## 2024-08-31 ENCOUNTER — Telehealth: Payer: Self-pay

## 2024-08-31 NOTE — Telephone Encounter (Signed)
 Henry Page called the emergency overnight pager regarding a low blood pressure reading of 95/62. He was asymptomatic and also noted that his HR was now in the 90s when usually it has been in the 70s. Per a previous note, I instructed him to take his full dose carvedilol  if his SBP >110. 12.5 mg carvedilol  if SBP <110, and to hold the carvedilol  if SBP < 100. He agreed. Since he was asymptomatic, I told him he could follow the above recommendations. ED precautions were given for symptomatic hypotension.

## 2024-09-09 DIAGNOSIS — I10 Essential (primary) hypertension: Secondary | ICD-10-CM | POA: Diagnosis not present

## 2024-09-10 ENCOUNTER — Ambulatory Visit: Payer: Self-pay | Admitting: Cardiovascular Disease

## 2024-09-10 LAB — COMPREHENSIVE METABOLIC PANEL WITH GFR
ALT: 17 IU/L (ref 0–44)
AST: 27 IU/L (ref 0–40)
Albumin: 3.7 g/dL — ABNORMAL LOW (ref 3.8–4.8)
Alkaline Phosphatase: 149 IU/L — ABNORMAL HIGH (ref 47–123)
BUN/Creatinine Ratio: 16 (ref 10–24)
BUN: 20 mg/dL (ref 8–27)
Bilirubin Total: 0.6 mg/dL (ref 0.0–1.2)
CO2: 22 mmol/L (ref 20–29)
Calcium: 9.2 mg/dL (ref 8.6–10.2)
Chloride: 100 mmol/L (ref 96–106)
Creatinine, Ser: 1.27 mg/dL (ref 0.76–1.27)
Globulin, Total: 2.9 g/dL (ref 1.5–4.5)
Glucose: 76 mg/dL (ref 70–99)
Potassium: 4.8 mmol/L (ref 3.5–5.2)
Sodium: 137 mmol/L (ref 134–144)
Total Protein: 6.6 g/dL (ref 6.0–8.5)
eGFR: 58 mL/min/1.73 — ABNORMAL LOW (ref >59)

## 2024-09-14 ENCOUNTER — Encounter: Payer: Self-pay | Admitting: Cardiovascular Disease

## 2024-09-14 ENCOUNTER — Ambulatory Visit: Attending: Cardiovascular Disease | Admitting: Cardiovascular Disease

## 2024-09-14 VITALS — BP 132/80 | HR 75 | Ht 70.0 in | Wt 177.0 lb

## 2024-09-14 DIAGNOSIS — I2510 Atherosclerotic heart disease of native coronary artery without angina pectoris: Secondary | ICD-10-CM

## 2024-09-14 DIAGNOSIS — Z9889 Other specified postprocedural states: Secondary | ICD-10-CM

## 2024-09-14 DIAGNOSIS — I10 Essential (primary) hypertension: Secondary | ICD-10-CM

## 2024-09-14 DIAGNOSIS — I4819 Other persistent atrial fibrillation: Secondary | ICD-10-CM

## 2024-09-14 DIAGNOSIS — E782 Mixed hyperlipidemia: Secondary | ICD-10-CM

## 2024-09-14 MED ORDER — CARVEDILOL 25 MG PO TABS: 12.5000 mg | ORAL_TABLET | Freq: Two times a day (BID) | ORAL | 3 refills | Status: AC

## 2024-09-14 NOTE — Assessment & Plan Note (Signed)
 Blood pressure now appears to be overly treated.  He has had a few systolic readings in the 90s associated with symptoms.  Recommend reduce carvedilol  to 12.5 mg twice daily.

## 2024-09-14 NOTE — Progress Notes (Addendum)
 " Cardiology Office Note:    Date:  09/14/2024   ID:  CODI KERTZ, DOB [birth year], MRN [id number]  PCP:  Patient, No Pcp Per   [facility] Providers Cardiologist:  Ozell Fell, MD     Referring MD: No ref. provider found   Chief Complaint  Patient presents with   Atrial Fibrillation    History of Present Illness:    Henry Page is a 78 y.o. male with a hx of mitral valve disease, coronary artery disease, and atrial fibrillation. The patient underwent CABG, mitral valve repair, and maze procedure in 2014.  He developed recurrent atrial fibrillation in 2018 and underwent TEE guided cardioversion at that time.    He developed recurrent atrial fibrillation and was seen in the atrial fibrillation clinic in 2019.  He declined antiarrhythmic drug therapy at that time.  He then saw Dr. Kelsie in consultation in 2020 and again elected to continue with rate control and anticoagulation. He has had persistent AF since that time. Last echo in 2023 showed normal LVEF, mean transmitral gradient of 5 mmHg, and mild MR.   [name] is here alone today. He has always had somewhat labile BP, with a component of white coat HTN. He has quit drinking alcohol and has lost about 10 pounds over the past 6 months. He has had a few episodes of symptomatic hypotension and has been taking carvedilol  at a lower dose depending on his readings. No chest pain, chest pressure, or shortness of breath. 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  His wife has had some health issues over the last several months.   Current Medications: Current Meds  Medication Sig   amoxicillin  (AMOXIL ) 500 MG capsule Take 1,000 mg by mouth as needed (for pt's allergies).   carvedilol  (COREG ) 25 MG tablet TAKE 1 TABLET BY MOUTH TWICE DAILY   cetirizine (ZYRTEC) 10 MG tablet Take 10 mg by mouth daily as needed for allergies.   Coenzyme Q10 (CO Q 10) 100 MG CAPS Take 1 capsule by mouth daily.   ELIQUIS  5 MG TABS tablet  TAKE 1 TABLET BY MOUTH TWICE DAILY   fenofibrate  (TRICOR ) 48 MG tablet TAKE 1 TABLET BY MOUTH DAILY   magnesium  oxide (MAG-OX) 400 (240 Mg) MG tablet Take 400 mg by mouth daily.   Multiple Vitamin (MULTIVITAMIN) tablet Take 1 tablet by mouth daily.   rosuvastatin  (CRESTOR ) 10 MG tablet TAKE 1 TABLET BY MOUTH DAILY   sildenafil  (REVATIO ) 20 MG tablet Take 1-5 tablets by mouth daily as needed (Patient taking differently: as needed. Take 1-5 tablets by mouth daily as needed)     Allergies:   Shellfish allergy, Sulfa antibiotics, Meperidine and related, and Misc. sulfonamide containing compounds   ROS:   Please see the history of present illness.    All other systems reviewed and are negative.  EKGs/Labs/Other Studies Reviewed:    The following studies were reviewed today: Cardiac Studies & Procedures   ______________________________________________________________________________________________     ECHOCARDIOGRAM  ECHOCARDIOGRAM COMPLETE 03/20/2022  Narrative ECHOCARDIOGRAM REPORT    Patient Name:   Henry Page Date of Exam: 03/20/2022 Medical Rec #:  [id number]     Height:       70.0 in Accession #:    [id number]    Weight:       186.6 lb Date of Birth:  [birth year]     BSA:          2.027 m Patient Age:  78 years      BP:           164/111 mmHg Patient Gender: M             HR:           68 bpm. Exam Location:  [address]  Procedure: 2D Echo and 3D Echo  Indications:    Z98.890 s/p MVR  History:        Patient has prior history of Echocardiogram examinations, most recent 04/14/2020. Prior CABG, CKD, Arrythmias:Atrial Fibrillation; Risk Factors:Sleep Apnea. /P Maze operation for atrial fibrillation 07/22/2013 Complete bilateral atrial lesion set using bipolar radiofrequency and cryothermy ablation with clipping of LA appendage.  Mitral Valve: Complex valvuloplasty including triangular resection of posterior leaflet with artificial Gore-tex neocord placement x2 and  34mm Sorin Memo 3D ring annuloplasty valve is present in the mitral position. Procedure Date: 07/22/2013.  Sonographer:    Waldo Guadalajara RCS Referring Phys: [phone] Venora Kautzman  IMPRESSIONS   1. Left ventricular ejection fraction, by estimation, is 55 to 60%. The left ventricle has normal function. The left ventricle has no regional wall motion abnormalities. There is mild left ventricular hypertrophy. Left ventricular diastolic parameters are indeterminate. 2. Right ventricular systolic function is normal. The right ventricular size is moderately enlarged. There is normal pulmonary artery systolic pressure. 3. Left atrial size was moderately dilated. 4. Right atrial size was moderately dilated. 5. MV mean gradient 5 mmHg HR 68 bpm. MV peak gradient 15 mmhg. . Mild mitral valve regurgitation. There is a Complex valvuloplasty including triangular resection of posterior leaflet with artificial Gore-tex neocord placement x2 and 34mm Sorin Memo 3D ring annuloplasty present in the mitral position. Procedure Date: 07/22/2013. 6. The aortic valve is normal in structure. Aortic valve regurgitation is not visualized. 7. 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 8. The inferior vena cava is normal in size with greater than 50% respiratory variability, suggesting right atrial pressure of 3 mmHg.  Comparison(s): No significant change from prior study.  FINDINGS Left Ventricle: Left ventricular ejection fraction, by estimation, is 55 to 60%. The left ventricle has normal function. The left ventricle has no regional wall motion abnormalities. The left ventricular internal cavity size was normal in size. There is mild left ventricular hypertrophy. Left ventricular diastolic parameters are indeterminate.  Right Ventricle: The right ventricular size is moderately enlarged. No increase in right ventricular wall thickness. Right ventricular systolic function is normal. There is normal pulmonary artery  systolic pressure. The tricuspid regurgitant velocity is 2.61 m/s, and with an assumed right atrial pressure of 3 mmHg, the estimated right ventricular systolic pressure is 30.2 mmHg.  Left Atrium: Left atrial size was moderately dilated.  Right Atrium: Right atrial size was moderately dilated.  Pericardium: There is no evidence of pericardial effusion.  Mitral Valve: MV mean gradient 5 mmHg HR 68 bpm. MV peak gradient 15 mmhg. Mild mitral valve regurgitation. There is a Complex valvuloplasty including triangular resection of posterior leaflet with artificial Gore-tex neocord placement x2 and 34mm Sorin Memo 3D ring annuloplasty present in the mitral position. Procedure Date: 07/22/2013. MV peak gradient, 15.2 mmHg. The mean mitral valve gradient is 5.0 mmHg.  Tricuspid Valve: The tricuspid valve is grossly normal. Tricuspid valve regurgitation is mild.  Aortic Valve: The aortic valve is normal in structure. Aortic valve regurgitation is not visualized.  Pulmonic Valve: The pulmonic valve was not well visualized. Pulmonic valve regurgitation is trivial.  Aorta: Small ascending aortic aneurysm 4.2 cm.  Venous: The inferior vena cava is normal in size with greater than 50% respiratory variability, suggesting right atrial pressure of 3 mmHg.  IAS/Shunts: The interatrial septum was not well visualized.   LEFT VENTRICLE PLAX 2D LVIDd:         4.60 cm   Diastology LVIDs:         3.40 cm   LV e' medial:    10.40 cm/s LV PW:         1.30 cm   LV E/e' medial:  15.9 LV IVS:        1.20 cm   LV e' lateral:   10.40 cm/s LVOT diam:     2.10 cm   LV E/e' lateral: 15.9 LV SV:         55 LV SV Index:   27 LVOT Area:     3.46 cm  3D Volume EF: 3D EF:        59 % LV EDV:       88 ml LV ESV:       36 ml LV SV:        52 ml  RIGHT VENTRICLE RV Basal diam:  5.00 cm RV Mid diam:    4.30 cm RV S prime:     11.70 cm/s TAPSE (M-mode): 1.5 cm RVSP:           30.2 mmHg  LEFT ATRIUM               Index        RIGHT ATRIUM           Index LA diam:        5.40 cm  2.66 cm/m   RA Pressure: 3.00 mmHg LA Vol (A2C):   114.0 ml 56.25 ml/m  RA Area:     26.80 cm LA Vol (A4C):   90.4 ml  44.60 ml/m  RA Volume:   87.50 ml  43.17 ml/m LA Biplane Vol: 102.0 ml 50.33 ml/m AORTIC VALVE LVOT Vmax:   63.90 cm/s LVOT Vmean:  44.700 cm/s LVOT VTI:    0.159 m  AORTA Ao Root diam: 3.50 cm Ao Asc diam:  4.20 cm  MITRAL VALVE                TRICUSPID VALVE MV Area (PHT): 2.36 cm     TR Peak grad:   27.2 mmHg MV Area VTI:   1.23 cm     TR Vmax:        261.00 cm/s MV Peak grad:  15.2 mmHg    Estimated RAP:  3.00 mmHg MV Mean grad:  5.0 mmHg     RVSP:           30.2 mmHg MV Vmax:       1.95 m/s MV Vmean:      92.9 cm/s    SHUNTS MV Decel Time: 322 msec     Systemic VTI:  0.16 m MV E velocity: 165.00 cm/s  Systemic Diam: 2.10 cm  Ronal Ross Electronically signed by Ronal Ross Signature Date/Time: 03/20/2022/1:06:20 PM    Final   TEE  ECHO TEE 02/08/2017  Narrative *[facility]* *[facility]* [address] [place], [place] [address] [phone]  ------------------------------------------------------------------- Transesophageal Echocardiography  Patient:    Kipton, Skillen MR #:       [id number] Study Date: 02/08/2017 Gender:     M Age:        69 Height:  177.8 cm Weight:     81.9 kg BSA:        2 m^2 Pt. Status: Room:  ADMITTING    Leim Moose, M.D. ATTENDING    Leim Moose, M.D. ORDERING     Leim Moose, M.D. PERFORMING   Leim Moose, M.D. REFERRING    Leim Moose, M.D. SONOGRAPHER  Ellouise Mose  cc:  ------------------------------------------------------------------- LV EF: 50% -   55%  ------------------------------------------------------------------- Study Conclusions  - Left ventricle: There was mild concentric hypertrophy. Systolic function was normal. The estimated ejection fraction was in the range of 50% to  55%. Diffuse hypokinesis. - Aortic valve: Structurally normal valve. Trileaflet; normal thickness leaflets. No evidence of vegetation. - Aorta: There was mild non-mobile atheroma. - Ascending aorta: The ascending aorta was upper noral size measuring 40 mm. - Mitral valve: Leaflet separation was normal. Mean gradient (D): 2 mm Hg. - Left atrium: The atrium was dilated. No evidence of thrombus in the atrial cavity or appendage. No evidence of thrombus. The appendage was ligated with no residual flow. - Right ventricle: The cavity size was normal. Wall thickness was normal. - Right atrium: No evidence of thrombus in the atrial cavity or appendage. - Pulmonary arteries: Systolic pressure was within the normal range.  Impressions:  - The appendage was ligated with no residual flow. No thrombus was seen in the left atrium. TEE was followed by a successful cardioversion.  ------------------------------------------------------------------- Study data:   Procedure:  Initial setup. The patient was brought to the laboratory. Surface ECG leads were monitored. Sedation. Conscious sedation was administered. Transesophageal echocardiography. Topical anesthesia was obtained using viscous lidocaine . A transesophageal probe was inserted by the attending cardiologist. Image quality was adequate.  Study completion:  The patient tolerated the procedure well. There were no complications. Diagnostic transesophageal echocardiography.  2D and color Doppler.  Birthdate:  Patient birthdate: 10/25/1947.  Age:  Patient is 78 yr old.  Sex:  Gender: male.    BMI: 25.9 kg/m^2.  Blood pressure:     150/98  Study date:  Study date: 02/08/2017. Study time: 09:47 AM.  -------------------------------------------------------------------  ------------------------------------------------------------------- Left ventricle:  There was mild concentric hypertrophy. Systolic function was normal. The estimated ejection  fraction was in the range of 50% to 55%. Diffuse hypokinesis.  ------------------------------------------------------------------- Aortic valve:   Structurally normal valve. Trileaflet; normal thickness leaflets. Cusp separation was normal.  No evidence of vegetation.  Doppler:  There was no regurgitation.  ------------------------------------------------------------------- Aorta:  There was mild non-mobile atheroma. Aortic root: The aortic root was not dilated. Ascending aorta: The ascending aorta was upper noral size measuring 40 mm. Descending aorta: The descending aorta was normal in size.  ------------------------------------------------------------------- Mitral valve:  S/P repair. There are two jets of mitral regurgitation with associated mild to moderate mitral regurgitation. Leaflet separation was normal.  Doppler:     Mean gradient (D): 2 mm Hg.  ------------------------------------------------------------------- Left atrium:  The atrium was dilated.  No evidence of thrombus in the atrial cavity or appendage.  No evidence of thrombus. The appendage was ligated with no residual flow.  ------------------------------------------------------------------- Right ventricle:  The cavity size was normal. Wall thickness was normal. Systolic function was normal.  ------------------------------------------------------------------- Pulmonic valve:    Structurally normal valve.  ------------------------------------------------------------------- Tricuspid valve:   Mildly thickened leaflets. Leaflet separation was normal.  Doppler:  There was no significant regurgitation.  ------------------------------------------------------------------- Pulmonary artery:   The main pulmonary artery was normal-sized. Systolic pressure was within the normal range.  ------------------------------------------------------------------- Right atrium:  The  atrium was normal in size.  No evidence  of thrombus in the atrial cavity or appendage. The appendage was morphologically a right appendage.  ------------------------------------------------------------------- Pericardium:  There was no pericardial effusion.  ------------------------------------------------------------------- Measurements  Mitral valve                         Value Mitral mean velocity, D              52.5  cm/s Mitral mean gradient, D              2     mm Hg Mitral annulus VTI, D                32.4  cm  Tricuspid valve                      Value Tricuspid regurg peak velocity       218   cm/s Tricuspid peak RV-RA gradient        19    mm Hg  Legend: (L)  and  (H)  mark values outside specified reference range.  ------------------------------------------------------------------- Prepared and Electronically Authenticated by  Leim Moose, M.D. 2018-02-16T11:10:40        ______________________________________________________________________________________________      EKG:   EKG Interpretation Date/Time:  Monday September 14 2024 09:24:35 EDT Ventricular Rate:  75 PR Interval:    QRS Duration:  96 QT Interval:  382 QTC Calculation: 426 R Axis:   71  Text Interpretation: Atrial fibrillation When compared with ECG of 09-Sep-2023 09:56, No significant change was found Confirmed by Wonda Sharper [phone]) on 09/14/2024 9:53:19 AM    Recent Labs: 03/09/2024: Hemoglobin 12.2; Platelets 337 09/09/2024: ALT 17; BUN 20; Creatinine, Ser 1.27; Potassium 4.8; Sodium 137  Recent Lipid Panel    Component Value Date/Time   CHOL 108 03/09/2024 1059   TRIG 133 03/09/2024 1059   HDL 31 (L) 03/09/2024 1059   CHOLHDL 3.5 03/09/2024 1059   CHOLHDL 3.4 06/05/2016 1048   VLDL 50 (H) 06/05/2016 1048   LDLCALC 53 03/09/2024 1059   LDLDIRECT 42.0 04/26/2015 0950     Risk Assessment/Calculations:    CHA2DS2-VASc Score = 4   This indicates a 4.8% annual risk of stroke. The patient's score is based  upon: CHF History: 0 HTN History: 1 Diabetes History: 0 Stroke History: 0 Vascular Disease History: 1 Age Score: 2 Gender Score: 0          STOP-Bang Score:          Physical Exam:    VS:  BP 132/80   Pulse 75   Ht 5' 10 (1.778 m)   Wt 177 lb (80.3 kg)   SpO2 97%   BMI 25.40 kg/m     Wt Readings from Last 3 Encounters:  09/14/24 177 lb (80.3 kg)  03/10/24 186 lb 12.8 oz (84.7 kg)  01/01/24 180 lb (81.6 kg)     GEN:  Well nourished, well developed in no acute distress HEENT: Normal NECK: No JVD; No carotid bruits LYMPHATICS: No lymphadenopathy CARDIAC: Irregularly irregular, no murmurs, rubs, gallops RESPIRATORY:  Clear to auscultation without rales, wheezing or rhonchi  ABDOMEN: Soft, non-tender, non-distended MUSCULOSKELETAL:  No edema; No deformity  SKIN: Warm and dry NEUROLOGIC:  Alert and oriented x 3 PSYCHIATRIC:  Normal affect   Assessment & Plan Mixed hyperlipidemia Treated with fenofibrate  and rosuvastatin .  Last lipids with cholesterol 108, LDL 53.  Continue  current management.  Patient has done a nice job with weight loss and alcohol cessation. Essential hypertension, benign Blood pressure now appears to be overly treated.  He has had a few systolic readings in the 90s associated with symptoms.  Recommend reduce carvedilol  to 12.5 mg twice daily. Persistent atrial fibrillation (HCC) Repeat echocardiogram in 6 months.  Continue apixaban  for anticoagulation and carvedilol  for rate control. Coronary artery disease involving native coronary artery of native heart without angina pectoris No anginal symptoms.  Off of antiplatelet therapy because of chronic oral anticoagulation.  Continue statin drug and carvedilol . S/P mitral valve repair Last echo reviewed with mild mitral regurgitation noted.  Repeat echocardiogram in 6 months prior to his office visit.  In addition to the above problems, the patient has complained of excessive daytime somnolence, poor  sleep quality, and he has had episodes of transient global amnesia as outlined above. Obstructive sleep apnea is an important consideration and could be contributing to his symptoms. I have recommended a home sleep study to further assess/evaluate for the presence of sleep apnea.           Medication Adjustments/Labs and Tests Ordered: Current medicines are reviewed at length with the patient today.  Concerns regarding medicines are outlined above.  Orders Placed This Encounter  Procedures   EKG 12-Lead   No orders of the defined types were placed in this encounter.   There are no Patient Instructions on file for this visit.   Signed, Ozell Fell, MD  09/14/2024 9:55 AM    [facility] "

## 2024-09-14 NOTE — Assessment & Plan Note (Signed)
 Treated with fenofibrate  and rosuvastatin .  Last lipids with cholesterol 108, LDL 53.  Continue current management.  Patient has done a nice job with weight loss and alcohol cessation.

## 2024-09-14 NOTE — Assessment & Plan Note (Signed)
 Last echo reviewed with mild mitral regurgitation noted.  Repeat echocardiogram in 6 months prior to his office visit.

## 2024-09-14 NOTE — Patient Instructions (Addendum)
 Medication Instructions:  DECREASE Carvedilol  (Coreg ) to 12.5 mg twice daily   *If you need a refill on your cardiac medications before your next appointment, please call your pharmacy*  Lab Work: None ordered today. If you have labs (blood work) drawn today and your tests are completely normal, you will receive your results only by: MyChart Message (if you have MyChart) OR A paper copy in the mail If you have any lab test that is abnormal or we need to change your treatment, we will call you to review the results.  Testing/Procedures: Your physician has requested that you have an echocardiogram to be completed prior to 6 month follow-up appointment with Dr. Wonda. Echocardiography is a painless test that uses sound waves to create images of your heart. It provides your doctor with information about the size and shape of your heart and how well your heart's chambers and valves are working. This procedure takes approximately one hour. There are no restrictions for this procedure. Please do NOT wear cologne, perfume, aftershave, or lotions (deodorant is allowed). Please arrive 15 minutes prior to your appointment time.  Please note: We ask at that you not bring children with you during ultrasound (echo/ vascular) testing. Due to room size and safety concerns, children are not allowed in the ultrasound rooms during exams. Our front office staff cannot provide observation of children in our lobby area while testing is being conducted. An adult accompanying a patient to their appointment will only be allowed in the ultrasound room at the discretion of the ultrasound technician under special circumstances. We apologize for any inconvenience.   Follow-Up: At [facility], you and your health needs are our priority.  As part of our continuing mission to provide you with exceptional heart care, our providers are all part of one team.  This team includes your primary Cardiologist (physician) and  Advanced Practice Providers or APPs (Physician Assistants and Nurse Practitioners) who all work together to provide you with the care you need, when you need it.  Your next appointment:   6 month(s)  Provider:   Ozell Wonda, MD

## 2024-09-14 NOTE — Assessment & Plan Note (Signed)
 No anginal symptoms.  Off of antiplatelet therapy because of chronic oral anticoagulation.  Continue statin drug and carvedilol .

## 2024-12-22 ENCOUNTER — Telehealth: Payer: Self-pay | Admitting: Cardiovascular Disease

## 2024-12-22 NOTE — Telephone Encounter (Signed)
 Patient's wife is calling because the patient has had the sleep study at their house (uncompleted) for quite some time now. Patient is planning on completed the sleep studies tonight, but they had questions on how it would work. Please advise.

## 2024-12-22 NOTE — Telephone Encounter (Signed)
"  error  "

## 2024-12-23 NOTE — Telephone Encounter (Signed)
**Note De-Identified Rami Waddle Obfuscation** I called the pts wife and DPR, Shona back and she stated that the pt is not feeling well at this time so he did not do his WatchPAT One-HST last night. She is concerned that he will not be able to sleep a full 5 to 6 hrs a night as he has been sick.  I advised her that it is ok to wait until a night when the pt feels better to do the home sleep test.  She states that the pts [facility] insurance plan will not be changing in the new year so the PA status should not change.  I went over the instructions on how the WatchPAT One-HST device works with her and she verbalized understanding and thanked me for calling her back.

## 2024-12-28 ENCOUNTER — Emergency Department (HOSPITAL_COMMUNITY)
Admission: RE | Admit: 2024-12-28 | Discharge: 2024-12-28 | Disposition: A | Discharge status: Home / Self Care | Source: Ambulatory Visit | Attending: Urology | Admitting: Urology

## 2024-12-28 ENCOUNTER — Other Ambulatory Visit: Payer: Self-pay

## 2024-12-28 ENCOUNTER — Emergency Department (HOSPITAL_COMMUNITY)

## 2024-12-28 ENCOUNTER — Encounter (HOSPITAL_COMMUNITY): Payer: Self-pay | Admitting: *Deleted

## 2024-12-28 ENCOUNTER — Emergency Department (HOSPITAL_COMMUNITY)
Admission: EM | Admit: 2024-12-28 | Discharge: 2024-12-28 | Disposition: A | Discharge status: Home / Self Care | Attending: Emergency Medicine | Admitting: Emergency Medicine

## 2024-12-28 ENCOUNTER — Telehealth: Payer: Self-pay | Admitting: Urology

## 2024-12-28 DIAGNOSIS — K8020 Calculus of gallbladder without cholecystitis without obstruction: Secondary | ICD-10-CM

## 2024-12-28 DIAGNOSIS — D631 Anemia in chronic kidney disease: Secondary | ICD-10-CM | POA: Insufficient documentation

## 2024-12-28 DIAGNOSIS — N189 Chronic kidney disease, unspecified: Secondary | ICD-10-CM | POA: Insufficient documentation

## 2024-12-28 DIAGNOSIS — N200 Calculus of kidney: Secondary | ICD-10-CM

## 2024-12-28 DIAGNOSIS — I129 Hypertensive chronic kidney disease with stage 1 through stage 4 chronic kidney disease, or unspecified chronic kidney disease: Secondary | ICD-10-CM | POA: Insufficient documentation

## 2024-12-28 DIAGNOSIS — Z7901 Long term (current) use of anticoagulants: Secondary | ICD-10-CM | POA: Insufficient documentation

## 2024-12-28 DIAGNOSIS — N201 Calculus of ureter: Secondary | ICD-10-CM

## 2024-12-28 DIAGNOSIS — N132 Hydronephrosis with renal and ureteral calculous obstruction: Secondary | ICD-10-CM | POA: Insufficient documentation

## 2024-12-28 DIAGNOSIS — D649 Anemia, unspecified: Secondary | ICD-10-CM

## 2024-12-28 DIAGNOSIS — R10A3 Flank pain, bilateral: Secondary | ICD-10-CM

## 2024-12-28 DIAGNOSIS — N179 Acute kidney failure, unspecified: Secondary | ICD-10-CM

## 2024-12-28 DIAGNOSIS — I4891 Unspecified atrial fibrillation: Secondary | ICD-10-CM | POA: Insufficient documentation

## 2024-12-28 LAB — COMPREHENSIVE METABOLIC PANEL WITH GFR
ALT: 20 U/L (ref 0–44)
AST: 53 U/L — ABNORMAL HIGH (ref 15–41)
Albumin: 3.7 g/dL (ref 3.5–5.0)
Alkaline Phosphatase: 163 U/L — ABNORMAL HIGH (ref 38–126)
Anion gap: 8 (ref 5–15)
BUN: 28 mg/dL — ABNORMAL HIGH (ref 8–23)
CO2: 25 mmol/L (ref 22–32)
Calcium: 8.8 mg/dL — ABNORMAL LOW (ref 8.9–10.3)
Chloride: 101 mmol/L (ref 98–111)
Creatinine, Ser: 1.83 mg/dL — ABNORMAL HIGH (ref 0.61–1.24)
GFR, Estimated: 37 mL/min — ABNORMAL LOW
Glucose, Bld: 84 mg/dL (ref 70–99)
Potassium: 4.6 mmol/L (ref 3.5–5.1)
Sodium: 134 mmol/L — ABNORMAL LOW (ref 135–145)
Total Bilirubin: 0.4 mg/dL (ref 0.0–1.2)
Total Protein: 6.9 g/dL (ref 6.5–8.1)

## 2024-12-28 LAB — URINALYSIS, ROUTINE W REFLEX MICROSCOPIC
Bilirubin Urine: NEGATIVE
Glucose, UA: NEGATIVE mg/dL
Hgb urine dipstick: NEGATIVE
Ketones, ur: NEGATIVE mg/dL
Leukocytes,Ua: NEGATIVE
Nitrite: NEGATIVE
Protein, ur: >=300 mg/dL — AB
Specific Gravity, Urine: 1.016 (ref 1.005–1.030)
pH: 6 (ref 5.0–8.0)

## 2024-12-28 LAB — CBC WITH DIFFERENTIAL/PLATELET
Abs Immature Granulocytes: 0.02 K/uL (ref 0.00–0.07)
Basophils Absolute: 0 K/uL (ref 0.0–0.1)
Basophils Relative: 0 %
Eosinophils Absolute: 0.1 K/uL (ref 0.0–0.5)
Eosinophils Relative: 2 %
HCT: 34 % — ABNORMAL LOW (ref 39.0–52.0)
Hemoglobin: 10.3 g/dL — ABNORMAL LOW (ref 13.0–17.0)
Immature Granulocytes: 0 %
Lymphocytes Relative: 27 %
Lymphs Abs: 1.3 K/uL (ref 0.7–4.0)
MCH: 26.2 pg (ref 26.0–34.0)
MCHC: 30.3 g/dL (ref 30.0–36.0)
MCV: 86.5 fL (ref 80.0–100.0)
Monocytes Absolute: 0.4 K/uL (ref 0.1–1.0)
Monocytes Relative: 9 %
Neutro Abs: 2.9 K/uL (ref 1.7–7.7)
Neutrophils Relative %: 62 %
Platelets: 291 K/uL (ref 150–400)
RBC: 3.93 MIL/uL — ABNORMAL LOW (ref 4.22–5.81)
RDW: 16 % — ABNORMAL HIGH (ref 11.5–15.5)
WBC: 4.7 K/uL (ref 4.0–10.5)
nRBC: 0 % (ref 0.0–0.2)

## 2024-12-28 LAB — PSA: Prostatic Specific Antigen: 436 ng/mL — ABNORMAL HIGH (ref 0.00–4.00)

## 2024-12-28 LAB — LIPASE, BLOOD: Lipase: 38 U/L (ref 11–51)

## 2024-12-28 MED ORDER — ONDANSETRON 4 MG PO TBDP: ORAL_TABLET | ORAL | 0 refills | Status: AC

## 2024-12-28 MED ORDER — HYDROCODONE-ACETAMINOPHEN 5-325 MG PO TABS: 1.0000 | ORAL_TABLET | Freq: Four times a day (QID) | ORAL | 0 refills | Status: AC | PRN

## 2024-12-28 MED ORDER — TAMSULOSIN HCL 0.4 MG PO CAPS: 0.4000 mg | ORAL_CAPSULE | Freq: Every day | ORAL | 0 refills | Status: AC

## 2024-12-28 NOTE — Discharge Instructions (Signed)
 Call [facility] in [place] and make an appointment with Dr. Sherrilee for this week.  Drink plenty of fluids.  Return if any problems

## 2024-12-28 NOTE — ED Provider Notes (Signed)
 Patient has a 3 mm left ureteral stone.  I spoke with the urologist Dr. Sherrilee and he reviewed the films.  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  He felt like the patient could be followed up in his office this week.  Patient preferred to be discharged home and he will be sent home with pain medicine nausea medicine and Flomax    Suzette Pac, MD 12/28/24 [phone]

## 2024-12-28 NOTE — Telephone Encounter (Signed)
 ER follow up

## 2024-12-28 NOTE — ED Notes (Signed)
 Ultrasound at bedside

## 2024-12-28 NOTE — Telephone Encounter (Signed)
 Patient was seen in ER and needs to be seen asap, kidney stone, prostate issue and bleeding. Please call [phone]

## 2024-12-28 NOTE — ED Provider Notes (Signed)
 " [place] EMERGENCY DEPARTMENT AT [facility] Provider Note   CSN: [id number] Arrival date & time: 12/28/24  [id number]     Patient presents with: Back Pain   Henry Page is a 79 y.o. male.   The history is provided by the patient.  Back Pain  He has history of hypertension, kidney stones, chronic kidney disease, atrial fibrillation anticoagulated on apixaban  and comes in because of concern about a possible kidney stone.  He has been having right flank pain for about the last week.  He went to an urgent care center where he was noted to have small amount of blood in his urine and he was put on cephalexin.  He stopped taking cephalexin after 2 days because he developed diarrhea.  He continued to have the flank pain which got worse tonight and he had some urinary hesitancy and unable to urinate.  After arriving in the ED, he was able to urinate a large amount and states that he feels much better.  He denies fever, chills, sweats.    Prior to Admission medications  Medication Sig Start Date End Date Taking? Authorizing Provider  amoxicillin  (AMOXIL ) 500 MG capsule Take 1,000 mg by mouth as needed (for pt's allergies). 01/18/19   [provider]  carvedilol  (COREG ) 25 MG tablet Take 0.5 tablets (12.5 mg total) by mouth 2 (two) times daily. 09/14/24   Cooper, Michael, MD  cetirizine (ZYRTEC) 10 MG tablet Take 10 mg by mouth daily as needed for allergies.    [provider]  Coenzyme Q10 (CO Q 10) 100 MG CAPS Take 1 capsule by mouth daily.    [provider]  ELIQUIS  5 MG TABS tablet TAKE 1 TABLET BY MOUTH TWICE DAILY 06/16/24   Wonda Sharper, MD  fenofibrate  (TRICOR ) 48 MG tablet TAKE 1 TABLET BY MOUTH DAILY 05/20/24   Wonda Sharper, MD  magnesium  oxide (MAG-OX) 400 (240 Mg) MG tablet Take 400 mg by mouth daily.    [provider]  Multiple Vitamin (MULTIVITAMIN) tablet Take 1 tablet by mouth daily.    [provider]  rosuvastatin  (CRESTOR )  10 MG tablet TAKE 1 TABLET BY MOUTH DAILY 03/17/24   Wonda Sharper, MD  sildenafil  (REVATIO ) 20 MG tablet Take 1-5 tablets by mouth daily as needed Patient taking differently: as needed. Take 1-5 tablets by mouth daily as needed 08/06/22   Wonda Sharper, MD    Allergies: Shellfish allergy, Sulfa antibiotics, Meperidine and related, and Misc. sulfonamide containing compounds    Review of Systems  Musculoskeletal:  Positive for back pain.  All other systems reviewed and are negative.   Updated Vital Signs BP (!) 151/92 (BP Location: Right Arm)   Pulse 100   Temp (!) 97.5 F (36.4 C) (Oral)   Resp 20   Ht 5' 10 (1.778 m)   Wt 81.6 kg   SpO2 100%   BMI 25.83 kg/m   Physical Exam Vitals and nursing note reviewed.   79 year old male, resting comfortably and in no acute distress. Vital signs are significant for elevated blood pressure. Oxygen saturation is 100%, which is normal. Head is normocephalic and atraumatic.  Back is nontender and there is no CVA tenderness. Lungs are clear without rales, wheezes, or rhonchi. Heart has regular rate and rhythm without murmur. Abdomen is soft, flat, nontender.  Bladder is not distended. Neurologic: Awake and alert, moves all extremities equally.  (all labs ordered are listed, but only abnormal results are displayed) Labs  Reviewed  URINALYSIS, ROUTINE W REFLEX MICROSCOPIC - Abnormal; Notable for the following components:      Result Value   APPearance HAZY (*)    Protein, ur >=300 (*)    Bacteria, UA RARE (*)    All other components within normal limits  COMPREHENSIVE METABOLIC PANEL WITH GFR - Abnormal; Notable for the following components:   Sodium 134 (*)    BUN 28 (*)    Creatinine, Ser 1.83 (*)    Calcium  8.8 (*)    AST 53 (*)    Alkaline Phosphatase 163 (*)    GFR, Estimated 37 (*)    All other components within normal limits  CBC WITH DIFFERENTIAL/PLATELET - Abnormal; Notable for the following components:   RBC 3.93 (*)     Hemoglobin 10.3 (*)    HCT 34.0 (*)    RDW 16.0 (*)    All other components within normal limits  LIPASE, BLOOD    Radiology: CT Renal Stone Study Result Date: 12/28/2024 CLINICAL DATA:  Abdominal flank pain. History of kidney stones. * Tracking Code: BO * EXAM: CT ABDOMEN AND PELVIS WITHOUT CONTRAST TECHNIQUE: Multidetector CT imaging of the abdomen and pelvis was performed following the standard protocol without IV contrast. RADIATION DOSE REDUCTION: This exam was performed according to the departmental dose-optimization program which includes automated exposure control, adjustment of the mA and/or kV according to patient size and/or use of iterative reconstruction technique. COMPARISON:  None Available. FINDINGS: Lower chest: Left lower lobe collapse/consolidation. Hepatobiliary: No suspicious focal abnormality in the liver on this study without intravenous contrast. 17 mm densely calcified gallstone evident. Gallbladder wall is ill-defined with subtle pericholecystic edema. Pancreas: No focal mass lesion. No dilatation of the main duct. No intraparenchymal cyst. No peripancreatic edema. Spleen: No splenomegaly. No suspicious focal mass lesion. Adrenals/Urinary Tract: No adrenal nodule or mass. Tiny stones are seen in the right kidney measuring up to 4 mm on image 43/2. Punctate stone noted lower pole left kidney with associated left-sided hydroureteronephrosis. 3 mm stone is identified in the mid left ureter but ureter remains dilated beyond this level. Potential second punctate more distal left ureteral stone measuring about a mm in visible on coronal 82/6. Distal to this level, the ureter abruptly tapers as demonstrated on coronal 82/6. Masslike soft tissue density is identified in the posterior/dependent bladder base having higher attenuation than the adjacent prostate parenchyma. This could be intraluminal bladder thrombus or a large exophytic mucosal neoplasm (see axial 79/2 and sagittal 93/8).  Stomach/Bowel: Stomach is unremarkable. No gastric wall thickening. No evidence of outlet obstruction. No duodenal wall thickening although there is Peri duodenal edema/stranding. No small bowel wall thickening. No small bowel dilatation. Hazy ground-glass opacities identified in the central small bowel mesentery. The terminal ileum is normal. The appendix is not well visualized, but there is no edema or inflammation in the region of the cecal tip to suggest appendicitis. Diverticuli are seen scattered along the entire length of the colon without CT findings of diverticulitis. Vascular/Lymphatic: There is moderate atherosclerotic calcification of the abdominal aorta without aneurysm. Relatively bulky retroperitoneal lymphadenopathy is evident including 16 mm short axis aortocaval node on 41/2. 16 mm left pericaval node seen on 44/2. Left greater than right pelvic sidewall lymphadenopathy evident with a bulk E 2.7 cm short axis left external iliac node seen on 76/2. 14 mm left perirectal lymph nodes seen on 80/2. 1.8 cm short axis right retrocrural node visible on 26/2. Reproductive: Prostate gland is enlarged and ill-defined.  Other: Subtle edema is seen in the gallbladder fossa and around the descending duodenum as well as at the inferior tip of the right liver. Diffuse stranding evident within the extraperitoneal soft tissues of the pelvic floor. Musculoskeletal: Soft tissue mass is identified in the left operator externus, poorly visualized on noncontrast imaging measuring on the order of 6.3 x 3.8 cm (axial 91/2) underlying sclerosis in loss of cortical bone in the adjacent inferior pubic ramus. 2.5 cm sclerotic lesion posterior left iliac bone on 62/2. Expansile destructive lesion identified in the L3 left transverse process (45/2) densely sclerotic lesion identified in the T8 and L2 vertebral bodies. IMPRESSION: 1. Left lower lobe collapse/consolidation. 2. Cholelithiasis with ill-defined gallbladder wall and  subtle pericholecystic edema. Imaging features raise concern for acute cholecystitis. Right upper quadrant ultrasound recommended to further evaluate. 3. Left-sided hydroureteronephrosis with 3 mm stone in the mid left ureter and potential second punctate more distal left ureteral stone. Distal to the stones, the ureter abruptly tapers in the left pelvic sidewall. This may be related to a stricture or urothelial neoplasm. 4. Masslike soft tissue density in the posterior/dependent bladder base has higher attenuation than the adjacent prostate parenchyma. This could be intraluminal bladder thrombus or a large exophytic mucosal neoplasm. 5. Bulky retroperitoneal and pelvic sidewall lymphadenopathy with soft tissue mass in the left operator externus muscle and underlying sclerosis in the adjacent inferior pubic ramus. Imaging features are compatible with metastatic disease. 6. Multiple sclerotic lesions in the lumbar spine and pelvis, compatible with metastatic disease. 7. Hazy ground-glass opacities in the central small bowel mesentery. This is nonspecific but can be seen in the setting of mesenteric panniculitis. 8.  Aortic Atherosclerosis (ICD10-I70.0). Electronically Signed   By: Camellia Candle M.D.   On: 12/28/2024 05:36     Procedures   Medications Ordered in the ED - No data to display                                  Medical Decision Making Amount and/or Complexity of Data Reviewed Labs: ordered. Radiology: ordered.   Flank pain which could be from urolithiasis versus pyelonephritis versus none urologic causes of pain such as diverticulitis or appendicitis or pancreatitis or cholecystitis.  I reviewed his past records, and find no lab results or visit results from his recent urgent care visit.  I can see no prior abdominal imaging.  I have ordered urinalysis and renal stone protocol CT scan.  CT scan shows cholelithiasis with ill-defined gallbladder wall concerning for acute cholecystitis and  recommendation for right upper quadrant ultrasound, left-sided hydroureteronephrosis with 3 mm calculus in the left mid ureter, soft tissue density in the posterior/dependent bladder with concerning for either tumor or thrombus, retroperitoneal and pelvic sidewall lymphadenopathy concerning for metastatic disease, multiple sclerotic lesions in the lumbar spine and pelvis consistent with metastatic disease.  I have independently viewed the images, and agree with radiologist interpretation.  I have ordered the right upper quadrant ultrasound as well as screening labs.  I reviewed his screening labs and my interpretation is microscopic hematuria, anemia which has progressed from 03/09/2024 (nearly 2 g drop), mild hyponatremia which is not felt to be clinically significant, acute kidney injury with baseline creatinine 1.27 increased to 1.83 today, elevated and rising alkaline phosphatase potentially consistent with bony metastases, mild elevation of AST probably not clinically significant but new compared with 09/09/2024.  Right upper quadrant ultrasound is pending.  Case is signed out to Dr. Zammit.  Anticipate he will need admission for further evaluation.     Final diagnoses:  Bilateral flank pain  Ureterolithiasis  Calculus of gallbladder without cholecystitis without obstruction  Acute kidney injury (nontraumatic)  Normochromic normocytic anemia  Anticoagulated on apixaban     ED Discharge Orders     None          Raford Lenis, MD 12/28/24 [phone]  "

## 2024-12-28 NOTE — ED Triage Notes (Signed)
 Pt c/o lower back pain; pt states he has hx of kidney stones and was seen at urgent care last week and given antibiotics (cephelaxin)

## 2024-12-30 ENCOUNTER — Ambulatory Visit: Admitting: Urology

## 2024-12-30 ENCOUNTER — Telehealth (HOSPITAL_BASED_OUTPATIENT_CLINIC_OR_DEPARTMENT_OTHER): Payer: Self-pay

## 2024-12-30 VITALS — BP 149/98 | HR 55

## 2024-12-30 DIAGNOSIS — N200 Calculus of kidney: Secondary | ICD-10-CM | POA: Diagnosis not present

## 2024-12-30 DIAGNOSIS — C61 Malignant neoplasm of prostate: Secondary | ICD-10-CM | POA: Diagnosis not present

## 2024-12-30 LAB — URINALYSIS, ROUTINE W REFLEX MICROSCOPIC
Bilirubin, UA: NEGATIVE
Glucose, UA: NEGATIVE
Ketones, UA: NEGATIVE
Leukocytes,UA: NEGATIVE
Nitrite, UA: NEGATIVE
Specific Gravity, UA: 1.01 (ref 1.005–1.030)
Urobilinogen, Ur: 0.2 mg/dL (ref 0.2–1.0)
pH, UA: 7 (ref 5.0–7.5)

## 2024-12-30 LAB — MICROSCOPIC EXAMINATION
Bacteria, UA: NONE SEEN
WBC, UA: NONE SEEN /HPF (ref 0–5)

## 2024-12-30 MED ORDER — LEVOFLOXACIN 750 MG PO TABS: 750.0000 mg | ORAL_TABLET | Freq: Every day | ORAL | 0 refills | Status: AC

## 2024-12-30 NOTE — Telephone Encounter (Signed)
 Pt is returning call to nurse.

## 2024-12-30 NOTE — Patient Instructions (Signed)
 Prostate Biopsy Instructions:  Appointment Time: 8:20 am Appointment Date: 01/18/2025 Location: [facility]   Please note this procedure is completed at [facility]. Please arrive 15 minutes prior to your scheduled visit time to allow registration time. If you are late for your scheduled visit time, you may be asked to reschedule due to time restraints.  You will need to stop all aspirin  or blood thinners (aspirin , plavix, coumadin , warfarin, motrin, ibuprofen, advil, aleve, naproxen, naprosyn) for 7 days prior to the procedure.  If you have any questions about stopping these medications, please contact your prescribing doctor.  Our office will obtain clearance for your blood thinners to be held- please do not stop these medications until you have been given instruction to do so.  Having a light meal prior to the procedure is recommended.  If you are diabetic or have low blood sugar please bring a small snack or glucose tablet.  A Fleets enema is needed to be purchased over the counter at a local pharmacy and used 2 hours before you scheduled appointment.  This can be purchased over the counter at any pharmacy.  One antibiotic tablet has been sent to your pharmacy. Please pick this prescription up and take 2 hours prior to your scheduled biopsy appointment. You will also be given an additional antibiotic injection at the biopsy appointment.   Please bring someone with you to the procedure to drive you home if you are given a valium to take prior to your procedure.   If you have any questions or concerns, please feel free to call the office at [phone] or send a Mychart message.  What you can expect after your prostate biopsy:  You may notice blood in your urine or stool up to 1 week after your procedure. You may notice blood in your semen up to 1 month after your procedure.  If you are unable to pee afterwards or develop a fever, please call  our office at [phone] and hit option 3 to speak with a clinical staff member.  Your physicain will notify you of your results.    Thank you, [facility]

## 2024-12-30 NOTE — Telephone Encounter (Signed)
 Patient with diagnosis of atrial fibrillation on Eliquis  for anticoagulation.    Procedure:  prostate biopsy    Date of Surgery:  Clearance 01/18/25                                 CHA2DS2-VASc Score = 4   This indicates a 4.8% annual risk of stroke. The patient's score is based upon: CHF History: 0 HTN History: 1 Diabetes History: 0 Stroke History: 0 Vascular Disease History: 1 Age Score: 2 Gender Score: 0   CrCl 37 Platelet count 291  Patient has not had an Afib/aflutter ablation in the last 3 months, DCCV within the last 4 weeks or a watchman implanted in the last 45 days   Per office protocol, patient can hold Eliquis  for 3 days prior to procedure.   Patient will not need bridging with Lovenox  (enoxaparin ) around procedure.  **This guidance is not considered finalized until pre-operative APP has relayed final recommendations.**

## 2024-12-30 NOTE — Telephone Encounter (Signed)
" ° °  Name: Henry Page  DOB: 10/31/1946  MRN: [id number]  Primary Cardiologist: Ozell Fell, MD   Preoperative team, please contact this patient and set up a phone call appointment for further preoperative risk assessment. Please obtain consent and complete medication review. Thank you for your help.  I confirm that guidance regarding antiplatelet and oral anticoagulation therapy has been completed and, if necessary, noted below.  Per office protocol, patient can hold Eliquis  for 3 days prior to procedure.   Patient will not need bridging with Lovenox  (enoxaparin ) around procedure.  I also confirmed the patient resides in the state of [place] . As per [facility] Medical Board telemedicine laws, the patient must reside in the state in which the provider is licensed.   Damien JAYSON Braver, NP 12/30/2024, 4:51 PM [facility]    "

## 2024-12-30 NOTE — Progress Notes (Signed)
 "  12/30/2024 10:55 AM   Henry Page [birth year] [id number]  Referring provider: No referring provider defined for this encounter.  Elevated PSA   HPI: Henry Page is a 79yo here for evaluation of elevated PSA and nephrolithiasis. He was seen in the ER 2 days ago with left flank pain. He was diagnosed with a 3mm left ureteral stone and pelvic lymphadenapathy. PSA 436. No family history of nephrolithiasis. He believes he passed his calculus. He denies nay flank pain.    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 ENDOSCOPY;  Service: Cardiovascular;  Laterality: N/A;   CARDIOVERSION N/A 10/10/2018   Procedure: CARDIOVERSION;  Surgeon: Francyne Headland, MD;  Location: [facility] ENDOSCOPY;  Service: Cardiovascular;  Laterality: N/A;   CATARACT EXTRACTION W/PHACO Left 07/26/2023   Procedure: CATARACT EXTRACTION PHACO AND INTRAOCULAR LENS PLACEMENT (IOC);  Surgeon: Harrie Agent, MD;  Location: [facility] ORS;  Service: Ophthalmology;  Laterality: Left;  CDE- 6.63    CATARACT EXTRACTION W/PHACO Right 08/05/2023   Procedure: CATARACT EXTRACTION PHACO AND INTRAOCULAR LENS PLACEMENT (IOC);  Surgeon: Harrie Agent, MD;  Location: [facility] ORS;  Service: Ophthalmology;  Laterality: Right;  CDE 10.35   CORONARY ARTERY BYPASS GRAFT N/A 07/22/2013   Procedure: CORONARY ARTERY BYPASS GRAFTING (CABG);  Surgeon: Sudie VEAR Laine, MD;  Location: [facility] OR;  Service: Open Heart Surgery;  Laterality: N/A;   CYSTOSCOPY     INTRAOPERATIVE TRANSESOPHAGEAL ECHOCARDIOGRAM N/A 07/22/2013   Procedure: INTRAOPERATIVE TRANSESOPHAGEAL ECHOCARDIOGRAM;  Surgeon: Sudie VEAR Laine, MD;  Location: [facility] OR;  Service: Open Heart Surgery;  Laterality: N/A;   LEFT AND RIGHT HEART CATHETERIZATION WITH CORONARY ANGIOGRAM  07/07/2013   Procedure: LEFT AND RIGHT HEART CATHETERIZATION WITH CORONARY ANGIOGRAM;  Surgeon: Aleene JINNY Passe, MD;  Location: [facility] CATH LAB;  Service: Cardiovascular;;   MAZE N/A 07/22/2013   Procedure: MAZE;  Surgeon: Sudie VEAR Laine, MD;  Location: [facility] OR;  Service: Open Heart Surgery;  Laterality: N/A;   MITRAL VALVE REPAIR N/A 07/22/2013   Procedure: MITRAL VALVE REPAIR (MVR);  Surgeon: Sudie VEAR Laine, MD;  Location: [facility] OR;  Service: Open Heart Surgery;  Laterality: N/A;   PATENT FORAMEN OVALE CLOSURE N/A 07/22/2013   Procedure: PATENT FORAMEN OVALE CLOSURE;  Surgeon: Sudie VEAR Laine, MD;  Location: [facility] OR;  Service: Open Heart Surgery;  Laterality: N/A;   TEE WITHOUT CARDIOVERSION N/A 07/08/2013   Procedure: TRANSESOPHAGEAL ECHOCARDIOGRAM (TEE);  Surgeon: Ezra GORMAN Shuck, MD;  Location: [facility] ENDOSCOPY;  Service: Cardiovascular;  Laterality: N/A;   TEE WITHOUT  CARDIOVERSION N/A 02/08/2017   Procedure: TRANSESOPHAGEAL ECHOCARDIOGRAM (TEE);  Surgeon: Leim VEAR Moose, MD;  Location: [facility] ENDOSCOPY;  Service: Cardiovascular;  Laterality: N/A;   TONSILLECTOMY      Home Medications:  Allergies as of 12/30/2024       Reactions   Shellfish Allergy Other (See Comments)   flulike symptoms    Sulfa  Antibiotics Other (See Comments)   very sick   Meperidine And Related Itching   Misc. Sulfonamide Containing Compounds         Medication List        Accurate as of December 30, 2024 10:55 AM. If you have any questions, ask your nurse or doctor.          carvedilol  25 MG tablet Commonly known as: COREG  Take 0.5 tablets (12.5 mg total) by mouth 2 (two) times daily.   cetirizine 10 MG tablet Commonly known as: ZYRTEC Take 10 mg by mouth daily as needed for allergies.   Co Q 10 100 MG Caps Take 1 capsule by mouth daily.   Eliquis  5 MG Tabs tablet Generic drug: apixaban  TAKE 1 TABLET BY MOUTH TWICE DAILY   fenofibrate  48 MG tablet Commonly known as: TRICOR  TAKE 1 TABLET BY MOUTH DAILY   HYDROcodone -acetaminophen  5-325 MG tablet Commonly known as: NORCO/VICODIN Take 1 tablet by mouth every 6 (six) hours as needed.   magnesium  oxide 400 (240 Mg) MG tablet Commonly known as: MAG-OX Take 400 mg by mouth daily.   multivitamin tablet Take 1 tablet by mouth daily.   ondansetron  4 MG disintegrating tablet Commonly known as: ZOFRAN -ODT 4mg  ODT q4 hours prn nausea/vomit   rosuvastatin  10 MG tablet Commonly known as: CRESTOR  TAKE 1 TABLET BY MOUTH DAILY   sildenafil  20 MG tablet Commonly known as: REVATIO  Take 1-5 tablets by mouth daily as needed What changed:  when to take this reasons to take this   tamsulosin  0.4 MG Caps capsule Commonly known as: Flomax  Take 1 capsule (0.4 mg total) by mouth daily.        Allergies: Allergies[1]  Family History: Family History  Problem Relation Age of Onset   Heart attack Father     Social History:  reports that he has never smoked. He has never used smokeless tobacco. He reports current alcohol use of about 2.0 standard drinks of alcohol per week. He reports that he does not use drugs.  ROS: All other review of systems were reviewed and are negative except what is noted above in HPI  Physical Exam: BP (!)  149/98   Pulse (!) 55   Constitutional:  Alert and oriented, No acute distress. HEENT:  AT, moist mucus membranes.  Trachea midline, no masses. Cardiovascular: No clubbing, cyanosis, or edema. Respiratory: Normal respiratory effort, no increased work of breathing. GI: Abdomen is soft, nontender, nondistended, no abdominal masses GU: No CVA tenderness.  Lymph: No cervical or inguinal lymphadenopathy. Skin: No rashes, bruises or suspicious lesions. Neurologic: Grossly intact, no focal deficits, moving all 4 extremities. Psychiatric: Normal mood and affect.  Laboratory Data: Lab Results  Component Value Date   WBC 4.7 12/28/2024   HGB 10.3 (L) 12/28/2024   HCT 34.0 (L) 12/28/2024   MCV 86.5 12/28/2024   PLT 291 12/28/2024    Lab Results  Component Value Date   CREATININE 1.83 (H) 12/28/2024    No results found for: PSA  No results found for: TESTOSTERONE  Lab Results  Component Value Date   HGBA1C 5.7 (H) 03/09/2024  Urinalysis    Component Value Date/Time   COLORURINE YELLOW 12/28/2024 0437   APPEARANCEUR HAZY (A) 12/28/2024 0437   LABSPEC 1.016 12/28/2024 0437   PHURINE 6.0 12/28/2024 0437   GLUCOSEU NEGATIVE 12/28/2024 0437   HGBUR NEGATIVE 12/28/2024 0437   BILIRUBINUR NEGATIVE 12/28/2024 0437   KETONESUR NEGATIVE 12/28/2024 0437   PROTEINUR >=300 (A) 12/28/2024 0437   UROBILINOGEN 1.0 07/20/2013 1008   NITRITE NEGATIVE 12/28/2024 0437   LEUKOCYTESUR NEGATIVE 12/28/2024 0437    Lab Results  Component Value Date   BACTERIA RARE (A) 12/28/2024    Pertinent Imaging: CT 12/28/2024: Images reviewed and discussed with the patient Results for orders placed in visit on 12/28/24  DG Abd 1 View  Narrative CLINICAL DATA:  Kidney stone.  EXAM: ABDOMEN - 1 VIEW  COMPARISON:  CT abdomen and pelvis 12/28/2024  FINDINGS: Known small stones in left ureter are not confidently identified on this examination. Question a small stone just superior to the  left SI joint. Pelvic calcifications are suggestive for phleboliths. Patient has known small right renal calculi that poorly characterized. Question a 3 mm right kidney stone. Nonobstructive bowel gas pattern. Degenerative endplate changes in the lumbar spine.  IMPRESSION: 1. Urinary stones on the CT from 12/28/2024 are poorly characterized on this examination. Question a small stone just superior to the left SI joint.   Electronically Signed By: Juliene Balder M.D. On: 12/28/2024 16:02  No results found for this or any previous visit.  No results found for this or any previous visit.  No results found for this or any previous visit.  No results found for this or any previous visit.  No results found for this or any previous visit.  No results found for this or any previous visit.  Results for orders placed during the hospital encounter of 12/28/24  CT Renal Stone Study  Narrative CLINICAL DATA:  Abdominal flank pain. History of kidney stones. * Tracking Code: BO *  EXAM: CT ABDOMEN AND PELVIS WITHOUT CONTRAST  TECHNIQUE: Multidetector CT imaging of the abdomen and pelvis was performed following the standard protocol without IV contrast.  RADIATION DOSE REDUCTION: This exam was performed according to the departmental dose-optimization program which includes automated exposure control, adjustment of the mA and/or kV according to patient size and/or use of iterative reconstruction technique.  COMPARISON:  None Available.  FINDINGS: Lower chest: Left lower lobe collapse/consolidation.  Hepatobiliary: No suspicious focal abnormality in the liver on this study without intravenous contrast. 17 mm densely calcified gallstone evident. Gallbladder wall is ill-defined with subtle pericholecystic edema.  Pancreas: No focal mass lesion. No dilatation of the main duct. No intraparenchymal cyst. No peripancreatic edema.  Spleen: No splenomegaly. No suspicious focal mass  lesion.  Adrenals/Urinary Tract: No adrenal nodule or mass. Tiny stones are seen in the right kidney measuring up to 4 mm on image 43/2. Punctate stone noted lower pole left kidney with associated left-sided hydroureteronephrosis. 3 mm stone is identified in the mid left ureter but ureter remains dilated beyond this level. Potential second punctate more distal left ureteral stone measuring about a mm in visible on coronal 82/6. Distal to this level, the ureter abruptly tapers as demonstrated on coronal 82/6. Masslike soft tissue density is identified in the posterior/dependent bladder base having higher attenuation than the adjacent prostate parenchyma. This could be intraluminal bladder thrombus or a large exophytic mucosal neoplasm (see axial 79/2 and sagittal 93/8).  Stomach/Bowel: Stomach is unremarkable. No gastric wall thickening. No evidence of  outlet obstruction. No duodenal wall thickening although there is Peri duodenal edema/stranding. No small bowel wall thickening. No small bowel dilatation. Hazy ground-glass opacities identified in the central small bowel mesentery. The terminal ileum is normal. The appendix is not well visualized, but there is no edema or inflammation in the region of the cecal tip to suggest appendicitis. Diverticuli are seen scattered along the entire length of the colon without CT findings of diverticulitis.  Vascular/Lymphatic: There is moderate atherosclerotic calcification of the abdominal aorta without aneurysm. Relatively bulky retroperitoneal lymphadenopathy is evident including 16 mm short axis aortocaval node on 41/2. 16 mm left pericaval node seen on 44/2. Left greater than right pelvic sidewall lymphadenopathy evident with a bulk E 2.7 cm short axis left external iliac node seen on 76/2. 14 mm left perirectal lymph nodes seen on 80/2. 1.8 cm short axis right retrocrural node visible on 26/2.  Reproductive: Prostate gland is enlarged and  ill-defined.  Other: Subtle edema is seen in the gallbladder fossa and around the descending duodenum as well as at the inferior tip of the right liver. Diffuse stranding evident within the extraperitoneal soft tissues of the pelvic floor.  Musculoskeletal: Soft tissue mass is identified in the left operator externus, poorly visualized on noncontrast imaging measuring on the order of 6.3 x 3.8 cm (axial 91/2) underlying sclerosis in loss of cortical bone in the adjacent inferior pubic ramus. 2.5 cm sclerotic lesion posterior left iliac bone on 62/2. Expansile destructive lesion identified in the L3 left transverse process (45/2) densely sclerotic lesion identified in the T8 and L2 vertebral bodies.  IMPRESSION: 1. Left lower lobe collapse/consolidation. 2. Cholelithiasis with ill-defined gallbladder wall and subtle pericholecystic edema. Imaging features raise concern for acute cholecystitis. Right upper quadrant ultrasound recommended to further evaluate. 3. Left-sided hydroureteronephrosis with 3 mm stone in the mid left ureter and potential second punctate more distal left ureteral stone. Distal to the stones, the ureter abruptly tapers in the left pelvic sidewall. This may be related to a stricture or urothelial neoplasm. 4. Masslike soft tissue density in the posterior/dependent bladder base has higher attenuation than the adjacent prostate parenchyma. This could be intraluminal bladder thrombus or a large exophytic mucosal neoplasm. 5. Bulky retroperitoneal and pelvic sidewall lymphadenopathy with soft tissue mass in the left operator externus muscle and underlying sclerosis in the adjacent inferior pubic ramus. Imaging features are compatible with metastatic disease. 6. Multiple sclerotic lesions in the lumbar spine and pelvis, compatible with metastatic disease. 7. Hazy ground-glass opacities in the central small bowel mesentery. This is nonspecific but can be seen in the  setting of mesenteric panniculitis. 8.  Aortic Atherosclerosis (ICD10-I70.0).   Electronically Signed By: Camellia Candle M.D. On: 12/28/2024 05:36   Assessment & Plan:    1. Kidney stone (Primary) -followup 3 months with [facility] - Urinalysis, Routine w reflex microscopic  2. Prostate cancer [facility]) The patient and I talked about etiologies of elevated PSA.  We discussed the possible relationship between elevated PSA, prostate cancer, BPH, prostatitis, and UTI.   Conservative treatment of elevated PSA with watchful waiting was discussed with the patient.  All questions were answered.        All of the risks and benefits along with alternatives to prostate biopsy were discussed with the patient.  The patient gave fully informed consent to proceed with a transrectal ultrasound guided biopsy of the prostate for the evaluation of their evated PSA.  Prostate biopsy instructions and antibiotics were given to the patient.  No follow-ups on file.  Belvie Clara, MD  [facility] [place]       [1]  Allergies Allergen Reactions   Shellfish Allergy Other (See Comments)    flulike symptoms    Sulfa Antibiotics Other (See Comments)    very sick   Meperidine And Related Itching   Misc. Sulfonamide Containing Compounds    "

## 2024-12-30 NOTE — Telephone Encounter (Signed)
 No answer. Pt will need tele preop appt.

## 2024-12-30 NOTE — Telephone Encounter (Signed)
"  ° °  Pre-operative Risk Assessment    Patient Name: Henry Page  DOB: 09-06-1946 MRN: [id number]   Date of last office visit: 09/14/24 with [place] Date of next office visit: NA  Request for Surgical Clearance    Procedure:  prostate biopsy   Date of Surgery:  Clearance 01/18/25                                 Surgeon:  Dr. Sherrilee Surgeon's Group or Practice Name:  [facility] [place]  Phone number:  [phone] Fax number:  [phone]   Type of Clearance Requested:   - Medical  - Pharmacy:  Hold Apixaban  (Eliquis ) 3 days prior    Type of Anesthesia:  Local    Additional requests/questions:    Bonney Augustin JONETTA Delores   12/30/2024, 4:06 PM   "

## 2024-12-31 ENCOUNTER — Telehealth: Payer: Self-pay

## 2024-12-31 ENCOUNTER — Telehealth: Payer: Self-pay | Admitting: Urology

## 2024-12-31 NOTE — Telephone Encounter (Signed)
 Wife called with questions about chemo pill. Please call [phone]

## 2024-12-31 NOTE — Telephone Encounter (Signed)
 Called patient to schedule a televisit for a pre-op clearance on 01/05/25 @ 2:00. Meds, Rec, Consent done.       Patient Consent for Virtual Visit        Henry Page has provided verbal consent on 12/31/2024 for a virtual visit (video or telephone).   CONSENT FOR VIRTUAL VISIT FOR:  Henry Page  By participating in this virtual visit I agree to the following:  I hereby voluntarily request, consent and authorize [facility] and its employed or contracted physicians, physician assistants, nurse practitioners or other licensed health care professionals (the Practitioner), to provide me with telemedicine health care services (the Services) as deemed necessary by the treating Practitioner. I acknowledge and consent to receive the Services by the Practitioner via telemedicine. I understand that the telemedicine visit will involve communicating with the Practitioner through live audiovisual communication technology and the disclosure of certain medical information by electronic transmission. I acknowledge that I have been given the opportunity to request an in-person assessment or other available alternative prior to the telemedicine visit and am voluntarily participating in the telemedicine visit.  I understand that I have the right to withhold or withdraw my consent to the use of telemedicine in the course of my care at any time, without affecting my right to future care or treatment, and that the Practitioner or I may terminate the telemedicine visit at any time. I understand that I have the right to inspect all information obtained and/or recorded in the course of the telemedicine visit and may receive copies of available information for a reasonable fee.  I understand that some of the potential risks of receiving the Services via telemedicine include:  Delay or interruption in medical evaluation due to technological equipment failure or disruption; Information transmitted may not be  sufficient (e.g. poor resolution of images) to allow for appropriate medical decision making by the Practitioner; and/or  In rare instances, security protocols could fail, causing a breach of personal health information.  Furthermore, I acknowledge that it is my responsibility to provide information about my medical history, conditions and care that is complete and accurate to the best of my ability. I acknowledge that Practitioner's advice, recommendations, and/or decision may be based on factors not within their control, such as incomplete or inaccurate data provided by me or distortions of diagnostic images or specimens that may result from electronic transmissions. I understand that the practice of medicine is not an exact science and that Practitioner makes no warranties or guarantees regarding treatment outcomes. I acknowledge that a copy of this consent can be made available to me via my patient portal [facility] MyChart), or I can request a printed copy by calling the office of [facility].    I understand that my insurance will be billed for this visit.   I have read or had this consent read to me. I understand the contents of this consent, which adequately explains the benefits and risks of the Services being provided via telemedicine.  I have been provided ample opportunity to ask questions regarding this consent and the Services and have had my questions answered to my satisfaction. I give my informed consent for the services to be provided through the use of telemedicine in my medical care

## 2024-12-31 NOTE — Telephone Encounter (Signed)
 Called patient to schedule a televisit for a pre-op clearance on 01/05/25 @ 2:00. Meds, Rec, Consent done.

## 2025-01-01 NOTE — Telephone Encounter (Signed)
 Called patient wife to let her know until we get the biopsy results back, the plan of treatment is unknown, MD did provide options but it is based off of the results, patient wife voiced her understanding stating MD discussed a pill, patient wife was re advised he will discuss more after biopsy

## 2025-01-04 ENCOUNTER — Telehealth: Payer: Self-pay

## 2025-01-04 NOTE — Telephone Encounter (Signed)
 Pt/ wife wanted to know why pt's prostate can not just be removed. Pt/wife is aware a message will be sent to MD, McKenzie on advisement. Voiced understanding

## 2025-01-04 NOTE — Telephone Encounter (Signed)
 Pt was made aware and voiced understanding There is no benefit to removing the prostate if the prostate cancer has spread outside the prostate

## 2025-01-05 ENCOUNTER — Encounter: Payer: Self-pay | Admitting: Urology

## 2025-01-05 ENCOUNTER — Encounter: Payer: Self-pay | Admitting: Nurse Practitioner

## 2025-01-05 ENCOUNTER — Ambulatory Visit

## 2025-01-05 DIAGNOSIS — Z01810 Encounter for preprocedural cardiovascular examination: Secondary | ICD-10-CM

## 2025-01-05 NOTE — Progress Notes (Signed)
 "   Virtual Visit via Telephone Note   Because of Henry Page co-morbid illnesses, he is at least at moderate risk for complications without adequate follow up.  This format is felt to be most appropriate for this patient at this time.  Due to technical limitations with video connection (technology), today's appointment will be conducted as an audio only telehealth visit, and Henry Page verbally agreed to proceed in this manner.   All issues noted in this document were discussed and addressed.  No physical exam could be performed with this format.  Evaluation Performed:  Preoperative cardiovascular risk assessment _____________   Date:  01/05/2025   Patient ID:  Henry Page, DOB 03/27/1946, MRN [id number] Patient Location:  Home Provider location:   Office  Primary Care Provider:  Patient, No Pcp Per Primary Cardiologist:  Ozell Fell, MD  Chief Complaint / Patient Profile   79 y.o. y/o male with a h/o CAD s/p CABG and mitral valve repair and maze in 2014, recurrent a fib in 2018 that has been persistent, HLD, hypertension who is pending prostate biopsy and presents today for telephonic preoperative cardiovascular risk assessment.  History of Present Illness    Henry Page is a 79 y.o. male who presents via audio/video conferencing for a telehealth visit today.  Pt was last seen in cardiology clinic on 09/14/24 by Dr. Fell.  At that time Henry Page was doing well.  The patient is now pending procedure as outlined above. Since his last visit, he denies chest pain, shortness of breath, lower extremity edema, fatigue, palpitations, melena, hematuria, hemoptysis, diaphoresis, weakness, presyncope, syncope, orthopnea, and PND. He is active with some walking and piddling around the house and is able to achieve > 4 METS activity without concerning cardiac symptoms.    Past Medical History    Past Medical History:  Diagnosis Date   Anxiety    Atrial fibrillation (HCC)  07/06/2013   CKD (chronic kidney disease), stage III (HCC)    Essential hypertension, benign 07/06/2013   Kidney stones    some passed spontaneous, one retrieved by cystoscope    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   LIMA to LAD, Sequential SVG to intermediate and OM1, SVG to RCA, EVH via right thigh   S/P Maze operation for atrial fibrillation 07/22/2013   Complete bilateral atrial lesion set using bipolar radiofrequency and cryothermy ablation with clipping of LA appendage    S/P mitral valve repair 07/22/2013   Complex valvuloplasty including triangular resection of posterior leaflet with artificial Gore-tex neocord placement x2 and 34mm Sorin Memo 3D ring annuloplasty   Shingles 09/2023   Sleep apnea    TGA (transient global amnesia)    Past Surgical History:  Procedure Laterality Date   CARDIOVERSION N/A 02/08/2017   Procedure: CARDIOVERSION;  Surgeon: Leim VEAR Moose, MD;  Location: [facility] ENDOSCOPY;  Service: Cardiovascular;  Laterality: N/A;   CARDIOVERSION N/A 10/10/2018   Procedure: CARDIOVERSION;  Surgeon: Francyne Headland, MD;  Location: [facility] ENDOSCOPY;  Service: Cardiovascular;  Laterality: N/A;   CATARACT EXTRACTION W/PHACO Left 07/26/2023   Procedure: CATARACT EXTRACTION PHACO AND INTRAOCULAR LENS PLACEMENT (IOC);  Surgeon: Harrie Agent, MD;  Location: [facility] ORS;  Service: Ophthalmology;  Laterality: Left;  CDE- 6.63   CATARACT EXTRACTION W/PHACO Right 08/05/2023   Procedure: CATARACT EXTRACTION PHACO AND INTRAOCULAR LENS PLACEMENT (IOC);  Surgeon: Harrie Agent, MD;  Location: [facility] ORS;  Service: Ophthalmology;  Laterality: Right;  CDE 10.35   CORONARY ARTERY BYPASS GRAFT  N/A 07/22/2013   Procedure: CORONARY ARTERY BYPASS GRAFTING (CABG);  Surgeon: Sudie VEAR Laine, MD;  Location: [facility] OR;  Service: Open Heart Surgery;  Laterality: N/A;   CYSTOSCOPY     INTRAOPERATIVE TRANSESOPHAGEAL ECHOCARDIOGRAM N/A 07/22/2013   Procedure: INTRAOPERATIVE TRANSESOPHAGEAL ECHOCARDIOGRAM;  Surgeon: Sudie VEAR Laine,  MD;  Location: [facility] OR;  Service: Open Heart Surgery;  Laterality: N/A;   LEFT AND RIGHT HEART CATHETERIZATION WITH CORONARY ANGIOGRAM  07/07/2013   Procedure: LEFT AND RIGHT HEART CATHETERIZATION WITH CORONARY ANGIOGRAM;  Surgeon: Aleene JINNY Passe, MD;  Location: [facility] CATH LAB;  Service: Cardiovascular;;   MAZE N/A 07/22/2013   Procedure: MAZE;  Surgeon: Sudie VEAR Laine, MD;  Location: [facility] OR;  Service: Open Heart Surgery;  Laterality: N/A;   MITRAL VALVE REPAIR N/A 07/22/2013   Procedure: MITRAL VALVE REPAIR (MVR);  Surgeon: Sudie VEAR Laine, MD;  Location: [facility] OR;  Service: Open Heart Surgery;  Laterality: N/A;   PATENT FORAMEN OVALE CLOSURE N/A 07/22/2013   Procedure: PATENT FORAMEN OVALE CLOSURE;  Surgeon: Sudie VEAR Laine, MD;  Location: [facility] OR;  Service: Open Heart Surgery;  Laterality: N/A;   TEE WITHOUT CARDIOVERSION N/A 07/08/2013   Procedure: TRANSESOPHAGEAL ECHOCARDIOGRAM (TEE);  Surgeon: Ezra GORMAN Shuck, MD;  Location: [facility] ENDOSCOPY;  Service: Cardiovascular;  Laterality: N/A;   TEE WITHOUT CARDIOVERSION N/A 02/08/2017   Procedure: TRANSESOPHAGEAL ECHOCARDIOGRAM (TEE);  Surgeon: Leim VEAR Moose, MD;  Location: [facility] ENDOSCOPY;  Service: Cardiovascular;  Laterality: N/A;   TONSILLECTOMY      Allergies  Allergies[1]  Home Medications    Prior to Admission medications  Medication Sig Start Date End Date Taking? Authorizing Provider  carvedilol  (COREG ) 25 MG tablet Take 0.5 tablets (12.5 mg total) by mouth 2 (two) times daily. 09/14/24   Cooper, Michael, MD  cetirizine (ZYRTEC) 10 MG tablet Take 10 mg by mouth daily as needed for allergies.    [provider]  Coenzyme Q10 (CO Q 10) 100 MG CAPS Take 1 capsule by mouth daily.    [provider]  ELIQUIS  5 MG TABS tablet TAKE 1 TABLET BY MOUTH TWICE DAILY 06/16/24   Wonda Sharper, MD  fenofibrate  (TRICOR ) 48 MG tablet TAKE 1 TABLET BY MOUTH DAILY 05/20/24   Wonda Sharper, MD  HYDROcodone -acetaminophen  (NORCO/VICODIN) 5-325 MG  tablet Take 1 tablet by mouth every 6 (six) hours as needed. 12/28/24   Zammit, Joseph, MD  levofloxacin  (LEVAQUIN ) 750 MG tablet Take 1 tablet (750 mg total) by mouth daily. Take 1 hour prior to your prostate biopsy procedure 01/18/25 12/30/24   Sherrilee Belvie CROME, MD  magnesium  oxide (MAG-OX) 400 (240 Mg) MG tablet Take 400 mg by mouth daily.    [provider]  Multiple Vitamin (MULTIVITAMIN) tablet Take 1 tablet by mouth daily.    [provider]  ondansetron  (ZOFRAN -ODT) 4 MG disintegrating tablet 4mg  ODT q4 hours prn nausea/vomit 12/28/24   Zammit, Joseph, MD  rosuvastatin  (CRESTOR ) 10 MG tablet TAKE 1 TABLET BY MOUTH DAILY 03/17/24   Wonda Sharper, MD  sildenafil  (REVATIO ) 20 MG tablet Take 1-5 tablets by mouth daily as needed Patient taking differently: as needed. Take 1-5 tablets by mouth daily as needed 08/06/22   Wonda Sharper, MD  tamsulosin  (FLOMAX ) 0.4 MG CAPS capsule Take 1 capsule (0.4 mg total) by mouth daily. 12/28/24   Suzette Pac, MD    Physical Exam    Vital Signs:  Henry Page does not have vital signs available for review today.  Given telephonic nature  of communication, physical exam is limited. AAOx3. NAD. Normal affect.  Speech and respirations are unlabored.  Accessory Clinical Findings    None  Assessment & Plan    1.  Preoperative Cardiovascular Risk Assessment: According to the Revised Cardiac Risk Index (RCRI), his Perioperative Risk of Major Cardiac Event is (%): 0.9. His Functional Capacity in METs is: 6.61 according to the Duke Activity Status Index (DASI). The patient is doing well from a cardiac perspective. Therefore, based on ACC/AHA guidelines, the patient would be at acceptable risk for the planned procedure without further cardiovascular testing.   The patient was advised that if he develops new symptoms prior to surgery to contact our office to arrange for a follow-up visit, and he verbalized understanding.  Per office protocol,  he may hold Eliquis  for 3 days prior to procedure and should resume as soon as hemodynamically stable postoperatively.  A copy of this note will be routed to requesting surgeon.  Time:   Today, I have spent 10 minutes with the patient with telehealth technology discussing medical history, symptoms, and management plan.     Henry EMERSON Bane, NP-C  01/05/2025, 2:06 PM [address], [address] [place], [place] [address] Office [phone] Fax [phone]      [1]  Allergies Allergen Reactions   Shellfish Allergy Other (See Comments)    flulike symptoms    Sulfa Antibiotics Other (See Comments)    very sick   Meperidine And Related Itching   Misc. Sulfonamide Containing Compounds    "

## 2025-01-06 ENCOUNTER — Ambulatory Visit: Admitting: Urology

## 2025-01-10 ENCOUNTER — Other Ambulatory Visit: Payer: Self-pay | Admitting: Cardiovascular Disease

## 2025-01-10 DIAGNOSIS — I480 Paroxysmal atrial fibrillation: Secondary | ICD-10-CM

## 2025-01-13 ENCOUNTER — Telehealth: Payer: Self-pay

## 2025-01-13 NOTE — Telephone Encounter (Signed)
 Pt's wife called about moving pt's biopsy date due to upcoming weather. Pt's wife is made aware I will speak to Dr. Sherrilee about changing the date for pt's prostate biopsy. Verbal from Dr. Sherrilee if Radiology is okay with Friday Called radiology and spoke to [facility] to changed BX.

## 2025-01-13 NOTE — Telephone Encounter (Signed)
 Open in error

## 2025-01-13 NOTE — Telephone Encounter (Signed)
 Return call to pt. Pt was concern about moving the bx out so far, verbal from Dr. Sherrilee that it is okay to move bx out. Moved pt Bx time and date . Pt given new date.

## 2025-01-13 NOTE — Telephone Encounter (Signed)
 Return called to pt's wife to make her aware pt's biopsy has been rescheduled. Pt confirmed Bx and voiced understanding.

## 2025-01-15 ENCOUNTER — Other Ambulatory Visit: Admitting: Urology

## 2025-01-18 ENCOUNTER — Other Ambulatory Visit: Admitting: Urology

## 2025-01-18 ENCOUNTER — Ambulatory Visit (HOSPITAL_COMMUNITY)

## 2025-01-19 ENCOUNTER — Ambulatory Visit: Admitting: Physician Assistant

## 2025-01-20 ENCOUNTER — Encounter: Payer: Self-pay | Admitting: Urology

## 2025-01-20 ENCOUNTER — Other Ambulatory Visit: Payer: Self-pay | Admitting: Urology

## 2025-01-20 ENCOUNTER — Ambulatory Visit: Admitting: Urology

## 2025-01-20 ENCOUNTER — Encounter (HOSPITAL_COMMUNITY): Payer: Self-pay

## 2025-01-20 ENCOUNTER — Ambulatory Visit (HOSPITAL_COMMUNITY)
Admission: RE | Admit: 2025-01-20 | Discharge: 2025-01-20 | Disposition: A | Discharge status: Home / Self Care | Source: Ambulatory Visit | Attending: Urology | Admitting: Urology

## 2025-01-20 DIAGNOSIS — C61 Malignant neoplasm of prostate: Secondary | ICD-10-CM

## 2025-01-20 MED ORDER — LIDOCAINE HCL (PF) 2 % IJ SOLN
INTRAMUSCULAR | Status: AC
  Filled 2025-01-20: qty 10

## 2025-01-20 MED ORDER — GENTAMICIN SULFATE 40 MG/ML IJ SOLN
80.0000 mg | Freq: Once | INTRAMUSCULAR | Status: AC
  Administered 2025-01-20: 80 mg via INTRAMUSCULAR

## 2025-01-20 MED ORDER — STERILE WATER FOR INJECTION IJ SOLN
INTRAMUSCULAR | Status: AC
  Filled 2025-01-20: qty 10

## 2025-01-20 MED ORDER — GENTAMICIN SULFATE 40 MG/ML IJ SOLN
INTRAMUSCULAR | Status: AC
  Filled 2025-01-20: qty 2

## 2025-01-20 NOTE — Progress Notes (Signed)
 Prostate Biopsy Procedure   Informed consent was obtained after discussing risks/benefits of the procedure.  A time out was performed to ensure correct patient identity.  Pre-Procedure: - Last PSA Level: No results found for: PSA - Gentamicin  given prophylactically - Levaquin  500 mg administered PO -Transrectal Ultrasound performed revealing a 127.4 gm prostate -No significant hypoechoic or median lobe noted  Procedure: - Prostate block performed using 10 cc 1% lidocaine  and biopsies taken from sextant areas, a total of 12 under ultrasound guidance.  Post-Procedure: - Patient tolerated the procedure well - He was counseled to seek immediate medical attention if experiences any severe pain, significant bleeding, or fevers - Return in one week to discuss biopsy results

## 2025-01-20 NOTE — Patient Instructions (Signed)
 Transrectal Ultrasound-Guided Prostate Biopsy, Care After What can I expect after the procedure? After the procedure, it is common to have: Pain and discomfort near your butt (rectum), especially while sitting. Pink-colored pee (urine). This is due to small amounts of blood in your pee. A burning feeling while peeing. Blood in your poop (stool). Bleeding from your butt. Blood in your semen. Follow these instructions at home: Medicines Take over-the-counter and prescription medicines only as told by your doctor. If you were given a sedative during your procedure, do not drive or use machines until your doctor says that it is safe. A sedative is a medicine that helps you relax. If you were prescribed an antibiotic medicine, take it as told by your doctor. Do not stop taking it even if you start to feel better. Activity  Return to your normal activities when your doctor says that it is safe. Ask your doctor when it is okay for you to have sex. You may have to avoid lifting. Ask your doctor how much you can safely lift. General instructions  Drink enough water to keep your pee pale yellow. Watch your pee, poop, and semen for new bleeding or bleeding that gets worse. Keep all follow-up visits. Contact a doctor if: You have any of these: Blood clots in your pee or poop. Blood in your pee more than 2 weeks after the procedure. Blood in your semen more than 2 months after the procedure. New or worse bleeding in your pee, poop, or semen. Very bad belly pain. Your pee smells bad or unusual. You have trouble peeing. Your lower belly feels firm. You have problems getting an erection. You feel like you may vomit (are nauseous), or you vomit. Get help right away if: You have a fever or chills. You have bright red pee. You have very bad pain that does not get better with medicine. You cannot pee. Summary After this procedure, it is common to have pain and discomfort near your butt,  especially while sitting. You may have blood in your pee and poop. It is common to have blood in your semen. Get help right away if you have a fever or chills. This information is not intended to replace advice given to you by your health care provider. Make sure you discuss any questions you have with your health care provider. Document Revised: 06/05/2021 Document Reviewed: 06/05/2021 Elsevier Patient Education  2024 [employer].

## 2025-01-20 NOTE — Progress Notes (Signed)
 Patient brought to US  RM1 in no acute distress. Prostate biopsy explained, Consent obtained. Prepped and draped in sterile manner. Local anesthetic admin with no adverse reaction. Access obtained under US  imaging, samples collected, preserved. Access removed. Allowed patient to wipe for any excess blood. None apparent. Advised that there may be scant bleeding over the next week. Escorted to radiology waiting to care of spouse.

## 2025-01-21 LAB — SURGICAL PATHOLOGY

## 2025-01-22 ENCOUNTER — Other Ambulatory Visit: Admitting: Urology

## 2025-01-26 ENCOUNTER — Telehealth: Payer: Self-pay | Admitting: Urology

## 2025-01-26 NOTE — Telephone Encounter (Signed)
 Please advised

## 2025-01-27 ENCOUNTER — Telehealth: Payer: Self-pay | Admitting: Cardiovascular Disease

## 2025-01-27 NOTE — Telephone Encounter (Signed)
 STAT if HR is under 50 or over 120  (normal HR is 60-100 beats per minute)  What is your heart rate? 68-98  Do you have a log of your heart rate readings (document readings)?    Do you have any other symptoms? Patient is calling in because his oxygen level in 91-94 and HR is 68-98. States that he doesn't feel like himself, states that he is exteremly tired

## 2025-01-28 ENCOUNTER — Ambulatory Visit

## 2025-01-28 ENCOUNTER — Encounter: Payer: Self-pay | Admitting: Physician Assistant

## 2025-01-28 VITALS — BP 130/78 | HR 92 | Ht 70.0 in | Wt 181.4 lb

## 2025-01-28 DIAGNOSIS — I4811 Longstanding persistent atrial fibrillation: Secondary | ICD-10-CM

## 2025-01-28 DIAGNOSIS — I10 Essential (primary) hypertension: Secondary | ICD-10-CM

## 2025-01-28 DIAGNOSIS — I2510 Atherosclerotic heart disease of native coronary artery without angina pectoris: Secondary | ICD-10-CM

## 2025-01-28 DIAGNOSIS — I340 Nonrheumatic mitral (valve) insufficiency: Secondary | ICD-10-CM

## 2025-01-28 DIAGNOSIS — Z9889 Other specified postprocedural states: Secondary | ICD-10-CM

## 2025-01-28 DIAGNOSIS — Z951 Presence of aortocoronary bypass graft: Secondary | ICD-10-CM

## 2025-01-28 DIAGNOSIS — E782 Mixed hyperlipidemia: Secondary | ICD-10-CM

## 2025-01-28 DIAGNOSIS — J069 Acute upper respiratory infection, unspecified: Secondary | ICD-10-CM

## 2025-01-28 DIAGNOSIS — N183 Chronic kidney disease, stage 3 unspecified: Secondary | ICD-10-CM

## 2025-01-28 NOTE — Patient Instructions (Signed)
 Medication Instructions:  NO CHANGES *If you need a refill on your cardiac medications before your next appointment, please call your pharmacy*  Lab Work: CBC,BMP,AND BNP TODAY If you have labs (blood work) drawn today and your tests are completely normal, you will receive your results only by: MyChart Message (if you have MyChart) OR A paper copy in the mail If you have any lab test that is abnormal or we need to change your treatment, we will call you to review the results.  Testing/Procedures: NO TESTING  Follow-Up: At [facility], you and your health needs are our priority.  As part of our continuing mission to provide you with exceptional heart care, our providers are all part of one team.  This team includes your primary Cardiologist (physician) and Advanced Practice Providers or APPs (Physician Assistants and Nurse Practitioners) who all work together to provide you with the care you need, when you need it.  Your next appointment:   KEEP FOLLOW UP MARCH 2026  Provider:   Ozell Fell, MD

## 2025-01-28 NOTE — Telephone Encounter (Signed)
 Pt c/o BP issue: STAT if pt c/o blurred vision, one-sided weakness or slurred speech.  STAT if BP is GREATER than 180/120 TODAY.  STAT if BP is LESS than 90/60 and SYMPTOMATIC TODAY  1. What is your BP concern? Elevated BP  2. Have you taken any BP medication today? No, Not yet  3. What are your last 5 BP readings?177/110  4. Are you having any other symptoms (ex. Dizziness, headache, blurred vision, passed out)? Congestion     Pt c/o Shortness Of Breath: STAT if SOB developed within the last 24 hours or pt is noticeably SOB on the phone  1. Are you currently SOB (can you hear that pt is SOB on the phone)? Yes  2. How long have you been experiencing SOB? 01/27/25  3. Are you SOB when sitting or when up moving around? Moving around   4. Are you currently experiencing any other symptoms? Low oxygen levels

## 2025-01-28 NOTE — Progress Notes (Unsigned)
 " Cardiology Office Note   Date:  01/28/2025  ID:  Henry Page, DOB 01/21/1946, MRN [id number] PCP: Patient, No Pcp Per  [facility] Providers Cardiologist:  Ozell Fell, MD { Click to update primary MD,subspecialty MD or APP then REFRESH:1}    History of Present Illness [name] is a 79 y.o. male with history of CAD s/p CABG X4 (LIMA to LAD, sequential SVG to OM and ramus intermedius, and SVG to RCA), MR s/p MVR, persistent atrial fibrillation s/p maze, hypertension, hyperlipidemia, and CKD stage III.    He establish care 06/2013 in the setting of shortness of breath and new onset atrial fibrillation. He was found to have severe MR. Cardiac catheterization 07/07/2013 showed CAD involving the LAD and RCA with moderate disease of the ramus and LCx, well-preserved LV function, and moderate-severe MR. Echo 06/2013 LVEF 45-50%, moderate to severe MR, and right-to-left atrial shunt. He underwent CABG X4, maze, left atrial appendage clipping, PFO, and MV repair 07/22/2013.    He has episode of atrial fibrillation 01/2017 associated with influenza s/p TEE guided cardioversion.  He had cardioversion again 09/2018.  He was back in atrial fibrillation the next month and deferred AAD therapy at the time. He was evaluated by EP and ablation was not recommended.  He noted to have labile blood pressures. Home sleep study was ordered as his BP is higher in the morning.  Sleep study was not performed.  Benzepril was held in the setting of hyperkalemia of 5.7.  Potassium normalized.  Echo 02/2022 LVEF 55 to 60%, no RWMA, mild LVH, RV normal, LA and RA moderately dilated, mild MR,and small ascending aortic dilation measuring 4.2 cm. Carotid ultrasound 01/2023 showed no hemodynamically significant stenosis.    He was last seen in office by Dr. Fell 09/14/2024 and had quit drinking alcohol and lost 10 pounds over the past 6 months.  He noted symptomatic hypotension and carvedilol  was reduced to 12.5 mg  twice daily.  He contacted the office yesterday due to oxygen levels 91-94% and stated he was extremely tired and did not feel like himself.  He reported blood pressure readings around 177/110 and noted to be dealing with a URI.  He presents today with his wife with shortness of breath and fatigue. The shortness of breath started 2-3 weeks ago when he started getting sick. He has had congestion without fever and cough. He is checking his BP repeatedly on the same arm. He does sleep with his BP cuff on at times. He reports that each time his BP is higher. He reports pushing himself to do activity, such as walking around [employer], and feeling drained after. He took a 25 mg Coreg  this morning. He continues to remain without alcohol. He does not have a PCP and has not been treated with antibiotic. He denies chest pain, lower extremity edema, fatigue, palpitations, melena, hematuria, hemoptysis, diaphoresis, weakness, presyncope, syncope, orthopnea, and PND.  ROS: All systems negative unless otherwise indicated HPI.  Studies Reviewed      Cardiac Studies & Procedures   ______________________________________________________________________________________________     ECHOCARDIOGRAM  ECHOCARDIOGRAM COMPLETE 03/20/2022  Narrative ECHOCARDIOGRAM REPORT    Patient Name:   Henry Page Date of Exam: 03/20/2022 Medical Rec #:  [id number]     Height:       70.0 in Accession #:    [id number]    Weight:       186.6 lb Date of Birth:  10/25/1947     BSA:  2.027 m Patient Age:    74 years      BP:           164/111 mmHg Patient Gender: M             HR:           68 bpm. Exam Location:  [address]  Procedure: 2D Echo and 3D Echo  Indications:    Z98.890 s/p MVR  History:        Patient has prior history of Echocardiogram examinations, most recent 04/14/2020. Prior CABG, CKD, Arrythmias:Atrial Fibrillation; Risk Factors:Sleep Apnea. /P Maze operation for atrial fibrillation  07/22/2013 Complete bilateral atrial lesion set using bipolar radiofrequency and cryothermy ablation with clipping of LA appendage.  Mitral Valve: Complex valvuloplasty including triangular resection of posterior leaflet with artificial Gore-tex neocord placement x2 and 34mm Sorin Memo 3D ring annuloplasty valve is present in the mitral position. Procedure Date: 07/22/2013.  Sonographer:    Waldo Guadalajara RCS Referring Phys: [phone] MICHAEL COOPER  IMPRESSIONS   1. Left ventricular ejection fraction, by estimation, is 55 to 60%. The left ventricle has normal function. The left ventricle has no regional wall motion abnormalities. There is mild left ventricular hypertrophy. Left ventricular diastolic parameters are indeterminate. 2. Right ventricular systolic function is normal. The right ventricular size is moderately enlarged. There is normal pulmonary artery systolic pressure. 3. Left atrial size was moderately dilated. 4. Right atrial size was moderately dilated. 5. MV mean gradient 5 mmHg HR 68 bpm. MV peak gradient 15 mmhg. . Mild mitral valve regurgitation. There is a Complex valvuloplasty including triangular resection of posterior leaflet with artificial Gore-tex neocord placement x2 and 34mm Sorin Memo 3D ring annuloplasty present in the mitral position. Procedure Date: 07/22/2013. 6. The aortic valve is normal in structure. Aortic valve regurgitation is not visualized. 7. 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 8. The inferior vena cava is normal in size with greater than 50% respiratory variability, suggesting right atrial pressure of 3 mmHg.  Comparison(s): No significant change from prior study.  FINDINGS Left Ventricle: Left ventricular ejection fraction, by estimation, is 55 to 60%. The left ventricle has normal function. The left ventricle has no regional wall motion abnormalities. The left ventricular internal cavity size was normal in size. There is mild left  ventricular hypertrophy. Left ventricular diastolic parameters are indeterminate.  Right Ventricle: The right ventricular size is moderately enlarged. No increase in right ventricular wall thickness. Right ventricular systolic function is normal. There is normal pulmonary artery systolic pressure. The tricuspid regurgitant velocity is 2.61 m/s, and with an assumed right atrial pressure of 3 mmHg, the estimated right ventricular systolic pressure is 30.2 mmHg.  Left Atrium: Left atrial size was moderately dilated.  Right Atrium: Right atrial size was moderately dilated.  Pericardium: There is no evidence of pericardial effusion.  Mitral Valve: MV mean gradient 5 mmHg HR 68 bpm. MV peak gradient 15 mmhg. Mild mitral valve regurgitation. There is a Complex valvuloplasty including triangular resection of posterior leaflet with artificial Gore-tex neocord placement x2 and 34mm Sorin Memo 3D ring annuloplasty present in the mitral position. Procedure Date: 07/22/2013. MV peak gradient, 15.2 mmHg. The mean mitral valve gradient is 5.0 mmHg.  Tricuspid Valve: The tricuspid valve is grossly normal. Tricuspid valve regurgitation is mild.  Aortic Valve: The aortic valve is normal in structure. Aortic valve regurgitation is not visualized.  Pulmonic Valve: The pulmonic valve was not well visualized. Pulmonic valve regurgitation is trivial.  Aorta:  Small ascending aortic aneurysm 4.2 cm.  Venous: The inferior vena cava is normal in size with greater than 50% respiratory variability, suggesting right atrial pressure of 3 mmHg.  IAS/Shunts: The interatrial septum was not well visualized.   LEFT VENTRICLE PLAX 2D LVIDd:         4.60 cm   Diastology LVIDs:         3.40 cm   LV e' medial:    10.40 cm/s LV PW:         1.30 cm   LV E/e' medial:  15.9 LV IVS:        1.20 cm   LV e' lateral:   10.40 cm/s LVOT diam:     2.10 cm   LV E/e' lateral: 15.9 LV SV:         55 LV SV Index:   27 LVOT Area:      3.46 cm  3D Volume EF: 3D EF:        59 % LV EDV:       88 ml LV ESV:       36 ml LV SV:        52 ml  RIGHT VENTRICLE RV Basal diam:  5.00 cm RV Mid diam:    4.30 cm RV S prime:     11.70 cm/s TAPSE (M-mode): 1.5 cm RVSP:           30.2 mmHg  LEFT ATRIUM              Index        RIGHT ATRIUM           Index LA diam:        5.40 cm  2.66 cm/m   RA Pressure: 3.00 mmHg LA Vol (A2C):   114.0 ml 56.25 ml/m  RA Area:     26.80 cm LA Vol (A4C):   90.4 ml  44.60 ml/m  RA Volume:   87.50 ml  43.17 ml/m LA Biplane Vol: 102.0 ml 50.33 ml/m AORTIC VALVE LVOT Vmax:   63.90 cm/s LVOT Vmean:  44.700 cm/s LVOT VTI:    0.159 m  AORTA Ao Root diam: 3.50 cm Ao Asc diam:  4.20 cm  MITRAL VALVE                TRICUSPID VALVE MV Area (PHT): 2.36 cm     TR Peak grad:   27.2 mmHg MV Area VTI:   1.23 cm     TR Vmax:        261.00 cm/s MV Peak grad:  15.2 mmHg    Estimated RAP:  3.00 mmHg MV Mean grad:  5.0 mmHg     RVSP:           30.2 mmHg MV Vmax:       1.95 m/s MV Vmean:      92.9 cm/s    SHUNTS MV Decel Time: 322 msec     Systemic VTI:  0.16 m MV E velocity: 165.00 cm/s  Systemic Diam: 2.10 cm  Ronal Ross Electronically signed by Ronal Ross Signature Date/Time: 03/20/2022/1:06:20 PM    Final   TEE  ECHO TEE 02/08/2017  Narrative *[place]* *[facility]* [address] [place], [place] [address] [phone]  ------------------------------------------------------------------- Transesophageal Echocardiography  Patient:    Irbin, Fines MR #:       [id number] Study Date: 02/08/2017 Gender:     M Age:  69 Height:     177.8 cm Weight:     81.9 kg BSA:        2 m^2 Pt. Status: Room:  ADMITTING    Leim Moose, M.D. ATTENDING    Leim Moose, M.D. ORDERING     Leim Moose, M.D. PERFORMING   Leim Moose, M.D. REFERRING    Leim Moose, M.D. SONOGRAPHER  Ellouise Mose  cc:  ------------------------------------------------------------------- LV EF: 50% -   55%  ------------------------------------------------------------------- Study Conclusions  - Left ventricle: There was mild concentric hypertrophy. Systolic function was normal. The estimated ejection fraction was in the range of 50% to 55%. Diffuse hypokinesis. - Aortic valve: Structurally normal valve. Trileaflet; normal thickness leaflets. No evidence of vegetation. - Aorta: There was mild non-mobile atheroma. - Ascending aorta: The ascending aorta was upper noral size measuring 40 mm. - Mitral valve: Leaflet separation was normal. Mean gradient (D): 2 mm Hg. - Left atrium: The atrium was dilated. No evidence of thrombus in the atrial cavity or appendage. No evidence of thrombus. The appendage was ligated with no residual flow. - Right ventricle: The cavity size was normal. Wall thickness was normal. - Right atrium: No evidence of thrombus in the atrial cavity or appendage. - Pulmonary arteries: Systolic pressure was within the normal range.  Impressions:  - The appendage was ligated with no residual flow. No thrombus was seen in the left atrium. TEE was followed by a successful cardioversion.  ------------------------------------------------------------------- Study data:   Procedure:  Initial setup. The patient was brought to the laboratory. Surface ECG leads were monitored. Sedation. Conscious sedation was administered. Transesophageal echocardiography. Topical anesthesia was obtained using viscous lidocaine . A transesophageal probe was inserted by the attending cardiologist. Image quality was adequate.  Study completion:  The patient tolerated the procedure well. There were no complications. Diagnostic transesophageal echocardiography.  2D and color Doppler.  Birthdate:  Patient birthdate: 10/25/1947.  Age:  Patient is 79 yr old.  Sex:  Gender: male.    BMI: 25.9 kg/m^2.   Blood pressure:     150/98  Study date:  Study date: 02/08/2017. Study time: 09:47 AM.  -------------------------------------------------------------------  ------------------------------------------------------------------- Left ventricle:  There was mild concentric hypertrophy. Systolic function was normal. The estimated ejection fraction was in the range of 50% to 55%. Diffuse hypokinesis.  ------------------------------------------------------------------- Aortic valve:   Structurally normal valve. Trileaflet; normal thickness leaflets. Cusp separation was normal.  No evidence of vegetation.  Doppler:  There was no regurgitation.  ------------------------------------------------------------------- Aorta:  There was mild non-mobile atheroma. Aortic root: The aortic root was not dilated. Ascending aorta: The ascending aorta was upper noral size measuring 40 mm. Descending aorta: The descending aorta was normal in size.  ------------------------------------------------------------------- Mitral valve:  S/P repair. There are two jets of mitral regurgitation with associated mild to moderate mitral regurgitation. Leaflet separation was normal.  Doppler:     Mean gradient (D): 2 mm Hg.  ------------------------------------------------------------------- Left atrium:  The atrium was dilated.  No evidence of thrombus in the atrial cavity or appendage.  No evidence of thrombus. The appendage was ligated with no residual flow.  ------------------------------------------------------------------- Right ventricle:  The cavity size was normal. Wall thickness was normal. Systolic function was normal.  ------------------------------------------------------------------- Pulmonic valve:    Structurally normal valve.  ------------------------------------------------------------------- Tricuspid valve:   Mildly thickened leaflets. Leaflet separation was normal.  Doppler:  There was no  significant regurgitation.  ------------------------------------------------------------------- Pulmonary artery:   The main pulmonary artery was normal-sized. Systolic pressure was within the normal  range.  ------------------------------------------------------------------- Right atrium:  The atrium was normal in size.  No evidence of thrombus in the atrial cavity or appendage. The appendage was morphologically a right appendage.  ------------------------------------------------------------------- Pericardium:  There was no pericardial effusion.  ------------------------------------------------------------------- Measurements  Mitral valve                         Value Mitral mean velocity, D              52.5  cm/s Mitral mean gradient, D              2     mm Hg Mitral annulus VTI, D                32.4  cm  Tricuspid valve                      Value Tricuspid regurg peak velocity       218   cm/s Tricuspid peak RV-RA gradient        19    mm Hg  Legend: (L)  and  (H)  mark values outside specified reference range.  ------------------------------------------------------------------- Prepared and Electronically Authenticated by  Leim Moose, M.D. 2018-02-16T11:10:40        ______________________________________________________________________________________________      Risk Assessment/Calculations  CHA2DS2-VASc Score = 4  {Confirm score is correct.  If not, click here to update score.  REFRESH note.  :1} This indicates a 4.8% annual risk of stroke. The patient's score is based upon: CHF History: 0 HTN History: 1 Diabetes History: 0 Stroke History: 0 Vascular Disease History: 1 Age Score: 2 Gender Score: 0   {This patient has a significant risk of stroke if diagnosed with atrial fibrillation.  Please consider VKA or DOAC agent for anticoagulation if the bleeding risk is acceptable.   You can also use the SmartPhrase .[email or web address] for documentation.    :[id number]}     STOP-Bang Score:     { Consider Dx Sleep Disordered Breathing or Sleep Apnea  ICD G47.33          :1}    Physical Exam VS:  BP 130/78 (BP Location: Right Arm, Patient Position: Sitting, Cuff Size: Normal)   Pulse 92   Ht 5' 10 (1.778 m)   Wt 181 lb 6.4 oz (82.3 kg)   SpO2 96%   BMI 26.03 kg/m        Wt Readings from Last 3 Encounters:  01/28/25 181 lb 6.4 oz (82.3 kg)  12/28/24 180 lb (81.6 kg)  09/14/24 177 lb (80.3 kg)    GEN: Well nourished, well developed in no acute distress NECK: No JVD; No carotid bruits CARDIAC: IRIR, no murmurs, rubs, gallops RESPIRATORY:  Clear to auscultation without rales, wheezing or rhonchi  ABDOMEN: Soft, non-tender, non-distended EXTREMITIES:  No edema; No deformity   ASSESSMENT AND PLAN  CAD s/p CABG X4- Cardiac catheterization 07/07/2013 showed CAD involving the LAD and RCA with moderate disease of the ramus and LCx, well-preserved LV function, and moderate-severe MR. Echo 06/2013 LVEF 45-50%, moderate to severe MR, and right-to-left atrial shunt. -LIMA to LAD, sequential SVG to OM and ramus intermedius, and SVG to RCA. -He is not on aspirin  as he is on chronic Eliquis  in the setting of atrial fibrillation. -Stable with no anginal symptoms. No indication for ischemic evaluation.  -His shortness of breath does not seem to be anginal equivalent.  -Continue carvedilol  12.5 mg twice  daily, and rosuvastatin  10 mg  MR s/p MVR- Echo 02/2022 LVEF 55 to 60%, no RWMA, mild LVH, RV normal, LA and RA moderately dilated, mild MR,and small ascending aortic dilation measuring 4.2 cm.  -He is short of breath when talking and with activity today. He has clear lung sounds and no edema on exam.  -Check BNP today.   Longstanding Persistent A-fib- s/p MAZE in 2014, DCCV 02/12/2017 and 10/12/2018. - He was back in atrial fibrillation within 1 month and deferred AAD therapy at the time. - He was seen and evaluated by EP in 2020 and ablation was not  recommended. -His heart rate is elevated today in the 90's. Discussed this could also be reason for shortness of breath.  -Reports not missing Eliquis  dose.  -Discussed if sickness and dehydration causing increase heart rate, can decrease dose once better.  - Continue Eliquis  5 mg BID and increase to carvedilol  25 mg BID.   CHA2DS2-VASc Score = 4 [CHF History: 0, HTN History: 1, Diabetes History: 0, Stroke History: 0, Vascular Disease History: 1, Age Score: 2, Gender Score: 0].  Therefore, the patient's annual risk of stroke is 4.8 %.     Hypertension- BP today 130/78 manual. Compared with home BP cuff of 130/92. - Reports BP at home is all over the place. He does take his BP repeatedly, as we is worried about it.  -Discussed to monitor BP at home at least 2 hours after medications and sitting for 5-10 minutes. -Discussed only taking BP twice a day at most, unless symptomatic.  -Continue carvedilol  25 mg BID.   Hyperlipidemia- Last LDL 53 on 03/09/2024. -Heart healthy diet and regular cardiovascular exercise encouraged.  -Continue fenofibrate  48 mg and rosuvastatin  10 mg.  CKD stage III- Last creatinine 1.83 and eGFR 37 on 12/28/2024 in the setting of kidney stone.  URI- Reports being sick for the last 2-3 weeks. He has been congested and not able to cough up his fleam. He does not have a PCP and has not been treated with antibiotics. -Encouraged establishment with PCP.  -Bilateral lung clear on exam.  -Is not drinking a lot of fluids and has not been eating regularly. -Push fluids and eat small regular meals.  - He reports he is going to Urgent Care for antibiotics.  -Repeat CBC and BMP today.        Dispo: Follow up as scheduled with Dr. Wonda.   Signed, Mardy KATHEE Pizza, FNP  "

## 2025-01-28 NOTE — Telephone Encounter (Signed)
 Patient called reporting intermittent elevated heart rates ranging from 65-115 bpm. States his HR is all over the place. Last BP 177/110. Current oxygen saturation is 94%. He states he is dealing with an upper respiratory illness and feels more short of breath related to this. He has been taking regular Mucinex. Denies headaches, chest pain, or dizziness at this time. Patient admits he has missed doses of Coreg  this week and has not yet taken his dose this morning but plans to do so. An appointment with an APP was scheduled for today. ED protocol reviewed, and the patient verbalized understanding.

## 2025-01-29 ENCOUNTER — Ambulatory Visit: Payer: Self-pay

## 2025-01-29 ENCOUNTER — Emergency Department (HOSPITAL_COMMUNITY): Admission: EM | Admit: 2025-01-29 | Source: Home / Self Care

## 2025-01-29 ENCOUNTER — Emergency Department (HOSPITAL_COMMUNITY)

## 2025-01-29 ENCOUNTER — Telehealth: Payer: Self-pay | Admitting: Cardiovascular Disease

## 2025-01-29 LAB — CBC
HCT: 30.8 % — ABNORMAL LOW (ref 39.0–52.0)
Hematocrit: 31.3 % — ABNORMAL LOW (ref 37.5–51.0)
Hemoglobin: 9.6 g/dL — ABNORMAL LOW (ref 13.0–17.7)
Hemoglobin: 9.2 g/dL — ABNORMAL LOW (ref 13.0–17.0)
MCH: 26.3 pg — ABNORMAL LOW (ref 26.6–33.0)
MCH: 26.4 pg (ref 26.0–34.0)
MCHC: 30.7 g/dL — ABNORMAL LOW (ref 31.5–35.7)
MCHC: 29.9 g/dL — ABNORMAL LOW (ref 30.0–36.0)
MCV: 86 fL (ref 79–97)
MCV: 88.3 fL (ref 80.0–100.0)
Platelets: 269 10*3/uL (ref 150–450)
Platelets: 244 10*3/uL (ref 150–400)
RBC: 3.65 x10E6/uL — ABNORMAL LOW (ref 4.14–5.80)
RBC: 3.49 MIL/uL — ABNORMAL LOW (ref 4.22–5.81)
RDW: 16 % — ABNORMAL HIGH (ref 11.6–15.4)
RDW: 17.2 % — ABNORMAL HIGH (ref 11.5–15.5)
WBC: 5.7 10*3/uL (ref 3.4–10.8)
WBC: 3.9 10*3/uL — ABNORMAL LOW (ref 4.0–10.5)
nRBC: 0 % (ref 0.0–0.2)

## 2025-01-29 LAB — BASIC METABOLIC PANEL WITH GFR
BUN/Creatinine Ratio: 14 (ref 10–24)
BUN: 70 mg/dL — AB (ref 8–27)
CO2: 21 mmol/L (ref 20–29)
Calcium: 8.8 mg/dL (ref 8.6–10.2)
Chloride: 100 mmol/L (ref 96–106)
Creatinine, Ser: 5.03 mg/dL — AB (ref 0.76–1.27)
Glucose: 87 mg/dL (ref 70–99)
Potassium: 5.6 mmol/L — AB (ref 3.5–5.2)
Sodium: 139 mmol/L (ref 134–144)
eGFR: 11 mL/min/{1.73_m2} — AB

## 2025-01-29 LAB — COMPREHENSIVE METABOLIC PANEL WITH GFR
ALT: 13 U/L (ref 0–44)
AST: 46 U/L — ABNORMAL HIGH (ref 15–41)
Albumin: 3.7 g/dL (ref 3.5–5.0)
Alkaline Phosphatase: 131 U/L — ABNORMAL HIGH (ref 38–126)
Anion gap: 16 — ABNORMAL HIGH (ref 5–15)
BUN: 81 mg/dL — ABNORMAL HIGH (ref 8–23)
CO2: 23 mmol/L (ref 22–32)
Calcium: 8.7 mg/dL — ABNORMAL LOW (ref 8.9–10.3)
Chloride: 97 mmol/L — ABNORMAL LOW (ref 98–111)
Creatinine, Ser: 5.99 mg/dL — ABNORMAL HIGH (ref 0.61–1.24)
GFR, Estimated: 9 mL/min — ABNORMAL LOW
Glucose, Bld: 186 mg/dL — ABNORMAL HIGH (ref 70–99)
Potassium: 6.2 mmol/L — ABNORMAL HIGH (ref 3.5–5.1)
Sodium: 137 mmol/L (ref 135–145)
Total Bilirubin: 0.4 mg/dL (ref 0.0–1.2)
Total Protein: 7.1 g/dL (ref 6.5–8.1)

## 2025-01-29 LAB — PROTIME-INR
INR: 1.8 — ABNORMAL HIGH (ref 0.8–1.2)
Prothrombin Time: 21.6 s — ABNORMAL HIGH (ref 11.4–15.2)

## 2025-01-29 LAB — BRAIN NATRIURETIC PEPTIDE: BNP: 217.3 pg/mL — AB (ref 0.0–100.0)

## 2025-01-29 MED ORDER — FUROSEMIDE 10 MG/ML IJ SOLN: 40.0000 mg | Freq: Once | INTRAMUSCULAR | Status: AC

## 2025-01-29 MED ORDER — CALCIUM CHLORIDE 10 % IV SOLN: 1.0000 g | Freq: Once | INTRAVENOUS | Status: AC

## 2025-01-29 NOTE — Telephone Encounter (Signed)
 Patient is calling to request a call back to go over labs and needs advice on how to deal with lethargy

## 2025-01-29 NOTE — Telephone Encounter (Signed)
 Called patient to inform him of creatinine level of 5.03, concerning for acute renal failure. Instructed patient to go to ED. He is going to to [facility] now.

## 2025-01-29 NOTE — ED Provider Notes (Incomplete)
 " [place] [facility] AT [facility] Provider Note   CSN: [id number] Arrival date & time: 01/29/25  8072     Patient presents with: abnormal labs (Kidney function)   Henry Page is a 79 y.o. male.  {Add pertinent medical, surgical, social history, OB history to HPI:32947} H/o afib but intermittently fast. Been a few weeks since he had a bout of kidney stones. States he lost a lot of blood. Has prostate cancer s/p biopsy. Has appt on Monday with Dr. Sherrilee for same. Recently diagnosed with a URI but has had sob for the last few weeks. Back pain too.         Prior to Admission medications  Medication Sig Start Date End Date Taking? Authorizing Provider  carvedilol  (COREG ) 25 MG tablet Take 0.5 tablets (12.5 mg total) by mouth 2 (two) times daily. 09/14/24   Cooper, Michael, MD  cetirizine (ZYRTEC) 10 MG tablet Take 10 mg by mouth daily as needed for allergies.    [provider]  Coenzyme Q10 (CO Q 10) 100 MG CAPS Take 1 capsule by mouth daily.    [provider]  ELIQUIS  5 MG TABS tablet TAKE 1 TABLET BY MOUTH TWICE DAILY 01/12/25   Wonda Sharper, MD  fenofibrate  (TRICOR ) 48 MG tablet TAKE 1 TABLET BY MOUTH DAILY 05/20/24   Wonda Sharper, MD  HYDROcodone -acetaminophen  (NORCO/VICODIN) 5-325 MG tablet Take 1 tablet by mouth every 6 (six) hours as needed. 12/28/24   Suzette Pac, MD  levofloxacin  (LEVAQUIN ) 750 MG tablet Take 1 tablet (750 mg total) by mouth daily. Take 1 hour prior to your prostate biopsy procedure 01/18/25 12/30/24   Sherrilee Belvie CROME, MD  magnesium  oxide (MAG-OX) 400 (240 Mg) MG tablet Take 400 mg by mouth daily.    [provider]  Multiple Vitamin (MULTIVITAMIN) tablet Take 1 tablet by mouth daily.    [provider]  ondansetron  (ZOFRAN -ODT) 4 MG disintegrating tablet 4mg  ODT q4 hours prn nausea/vomit 12/28/24   Zammit, Joseph, MD  rosuvastatin  (CRESTOR ) 10 MG tablet TAKE 1 TABLET BY MOUTH DAILY 03/17/24    Wonda Sharper, MD  sildenafil  (REVATIO ) 20 MG tablet Take 1-5 tablets by mouth daily as needed Patient taking differently: as needed. Take 1-5 tablets by mouth daily as needed 08/06/22   Wonda Sharper, MD  tamsulosin  (FLOMAX ) 0.4 MG CAPS capsule Take 1 capsule (0.4 mg total) by mouth daily. 12/28/24   Suzette Pac, MD    Allergies: Shellfish allergy, Sulfa antibiotics, Meperidine and related, and Misc. sulfonamide containing compounds    Review of Systems  Updated Vital Signs BP (!) 142/104 (BP Location: Right Arm)   Pulse (!) 102   Temp 97.6 F (36.4 C)   Resp (!) 22   Ht 5' 10 (1.778 m)   Wt 81.6 kg   SpO2 95%   BMI 25.83 kg/m   Physical Exam  (all labs ordered are listed, but only abnormal results are displayed) Labs Reviewed  CBC - Abnormal; Notable for the following components:      Result Value   WBC 3.9 (*)    RBC 3.49 (*)    Hemoglobin 9.2 (*)    HCT 30.8 (*)    MCHC 29.9 (*)    RDW 17.2 (*)    All other components within normal limits  COMPREHENSIVE METABOLIC PANEL WITH GFR - Abnormal; Notable for the following components:   Potassium 6.2 (*)    Chloride 97 (*)    Glucose, Bld 186 (*)  BUN 81 (*)    Creatinine, Ser 5.99 (*)    Calcium  8.7 (*)    AST 46 (*)    Alkaline Phosphatase 131 (*)    GFR, Estimated 9 (*)    Anion gap 16 (*)    All other components within normal limits  PROTIME-INR - Abnormal; Notable for the following components:   Prothrombin Time 21.6 (*)    INR 1.8 (*)    All other components within normal limits  URINALYSIS, W/ REFLEX TO CULTURE (INFECTION SUSPECTED)    EKG: None  Radiology: No results found.  {Document cardiac monitor, telemetry assessment procedure when appropriate:32947} Procedures   Medications Ordered in the ED - No data to display    {Click here for ABCD2, HEART and other calculators REFRESH Note before signing:1}                              Medical Decision Making Amount and/or Complexity of Data  Reviewed Labs: ordered. Radiology: ordered.   ***  {Document critical care time when appropriate  Document review of labs and clinical decision tools ie [id number], etc  Document your independent review of radiology images and any outside records  Document your discussion with family members, caretakers and with consultants  Document social determinants of health affecting pt's care  Document your decision making why or why not admission, treatments were needed:32947:::1}   Final diagnoses:  None    ED Discharge Orders     None        "

## 2025-01-29 NOTE — ED Triage Notes (Signed)
 Pt comes into the ED for abnormal labs of decreased kidney function. Pt states it hard to pee and starting to see blood when he urinates. Pt does have constipation. Pt denies any n/v or abd pain.    Pt has hx of kidney stones and prostate issues.    Pt is on abx for URI he was dx with at [facility]. Pt was given steroid shot as well. Pt is on Augmentin and flonase.  Pt is on eliquis 

## 2025-01-30 ENCOUNTER — Emergency Department (HOSPITAL_COMMUNITY)

## 2025-02-01 ENCOUNTER — Ambulatory Visit: Admitting: Urology

## 2025-02-22 ENCOUNTER — Other Ambulatory Visit (HOSPITAL_COMMUNITY)

## 2025-03-01 ENCOUNTER — Other Ambulatory Visit: Admitting: Urology

## 2025-03-17 ENCOUNTER — Ambulatory Visit: Admitting: Cardiovascular Disease
